# Patient Record
Sex: Male | Born: 1935 | Race: White | Hispanic: No | Smoking: Former smoker
Health system: Southern US, Community
[De-identification: ages and names within clinical notes are randomized; demographics above are authoritative.]

## PROBLEM LIST (undated history)

## (undated) DIAGNOSIS — M25562 Pain in left knee: Secondary | ICD-10-CM

## (undated) DIAGNOSIS — I2510 Atherosclerotic heart disease of native coronary artery without angina pectoris: Secondary | ICD-10-CM

## (undated) DIAGNOSIS — C4490 Unspecified malignant neoplasm of skin, unspecified: Secondary | ICD-10-CM

## (undated) DIAGNOSIS — I340 Nonrheumatic mitral (valve) insufficiency: Secondary | ICD-10-CM

## (undated) DIAGNOSIS — N189 Chronic kidney disease, unspecified: Secondary | ICD-10-CM

## (undated) DIAGNOSIS — J449 Chronic obstructive pulmonary disease, unspecified: Secondary | ICD-10-CM

## (undated) DIAGNOSIS — I214 Non-ST elevation (NSTEMI) myocardial infarction: Secondary | ICD-10-CM

## (undated) DIAGNOSIS — K225 Diverticulum of esophagus, acquired: Secondary | ICD-10-CM

## (undated) DIAGNOSIS — M109 Gout, unspecified: Secondary | ICD-10-CM

## (undated) DIAGNOSIS — J45909 Unspecified asthma, uncomplicated: Secondary | ICD-10-CM

## (undated) DIAGNOSIS — M542 Cervicalgia: Secondary | ICD-10-CM

## (undated) DIAGNOSIS — IMO0001 Reserved for inherently not codable concepts without codable children: Secondary | ICD-10-CM

## (undated) DIAGNOSIS — K4090 Unilateral inguinal hernia, without obstruction or gangrene, not specified as recurrent: Secondary | ICD-10-CM

## (undated) DIAGNOSIS — I25810 Atherosclerosis of coronary artery bypass graft(s) without angina pectoris: Secondary | ICD-10-CM

## (undated) DIAGNOSIS — Z952 Presence of prosthetic heart valve: Secondary | ICD-10-CM

## (undated) DIAGNOSIS — M1990 Unspecified osteoarthritis, unspecified site: Secondary | ICD-10-CM

## (undated) DIAGNOSIS — E785 Hyperlipidemia, unspecified: Secondary | ICD-10-CM

## (undated) DIAGNOSIS — I10 Essential (primary) hypertension: Secondary | ICD-10-CM

## (undated) DIAGNOSIS — I639 Cerebral infarction, unspecified: Secondary | ICD-10-CM

## (undated) DIAGNOSIS — I350 Nonrheumatic aortic (valve) stenosis: Secondary | ICD-10-CM

## (undated) DIAGNOSIS — I5042 Chronic combined systolic (congestive) and diastolic (congestive) heart failure: Secondary | ICD-10-CM

## (undated) DIAGNOSIS — I209 Angina pectoris, unspecified: Secondary | ICD-10-CM

## (undated) DIAGNOSIS — G8929 Other chronic pain: Secondary | ICD-10-CM

## (undated) DIAGNOSIS — K219 Gastro-esophageal reflux disease without esophagitis: Secondary | ICD-10-CM

## (undated) DIAGNOSIS — R011 Cardiac murmur, unspecified: Secondary | ICD-10-CM

## (undated) HISTORY — DX: Chronic combined systolic (congestive) and diastolic (congestive) heart failure: I50.42

## (undated) HISTORY — DX: Chronic obstructive pulmonary disease, unspecified: J44.9

## (undated) HISTORY — DX: Essential (primary) hypertension: I10

## (undated) HISTORY — DX: Unspecified asthma, uncomplicated: J45.909

## (undated) HISTORY — DX: Hyperlipidemia, unspecified: E78.5

## (undated) HISTORY — DX: Chronic kidney disease, unspecified: N18.9

## (undated) HISTORY — PX: CATARACT EXTRACTION: SUR2

## (undated) HISTORY — DX: Gout, unspecified: M10.9

## (undated) HISTORY — DX: Unspecified osteoarthritis, unspecified site: M19.90

## (undated) HISTORY — PX: CARDIAC VALVE REPLACEMENT: SHX585

## (undated) HISTORY — PX: CARDIAC CATHETERIZATION: SHX172

## (undated) HISTORY — DX: Atherosclerotic heart disease of native coronary artery without angina pectoris: I25.10

## (undated) HISTORY — DX: Cardiac murmur, unspecified: R01.1

## (undated) HISTORY — DX: Cerebral infarction, unspecified: I63.9

## (undated) HISTORY — DX: Atherosclerosis of coronary artery bypass graft(s) without angina pectoris: I25.810

## (undated) HISTORY — DX: Diverticulum of esophagus, acquired: K22.5

## (undated) HISTORY — DX: Nonrheumatic mitral (valve) insufficiency: I34.0

## (undated) HISTORY — PX: SKIN CANCER EXCISION: SHX779

## (undated) HISTORY — DX: Nonrheumatic aortic (valve) stenosis: I35.0

## (undated) HISTORY — DX: Unilateral inguinal hernia, without obstruction or gangrene, not specified as recurrent: K40.90

---

## 1963-02-06 DIAGNOSIS — I639 Cerebral infarction, unspecified: Secondary | ICD-10-CM

## 1963-02-06 HISTORY — DX: Cerebral infarction, unspecified: I63.9

## 1997-01-06 DIAGNOSIS — Z951 Presence of aortocoronary bypass graft: Secondary | ICD-10-CM | POA: Insufficient documentation

## 1997-01-06 HISTORY — PX: CORONARY ARTERY BYPASS GRAFT: SHX141

## 2010-03-17 DIAGNOSIS — I10 Essential (primary) hypertension: Secondary | ICD-10-CM | POA: Insufficient documentation

## 2010-03-17 DIAGNOSIS — I2510 Atherosclerotic heart disease of native coronary artery without angina pectoris: Secondary | ICD-10-CM | POA: Insufficient documentation

## 2011-01-25 LAB — LIPID PANEL: HDL: 52 mg/dL (ref 35–70)

## 2011-04-20 DIAGNOSIS — I340 Nonrheumatic mitral (valve) insufficiency: Secondary | ICD-10-CM | POA: Insufficient documentation

## 2011-04-20 DIAGNOSIS — Z951 Presence of aortocoronary bypass graft: Secondary | ICD-10-CM | POA: Insufficient documentation

## 2011-10-11 ENCOUNTER — Ambulatory Visit (INDEPENDENT_AMBULATORY_CARE_PROVIDER_SITE_OTHER): Payer: Medicare Other | Admitting: Sports Medicine

## 2011-10-11 ENCOUNTER — Encounter: Payer: Self-pay | Admitting: Sports Medicine

## 2011-10-11 VITALS — BP 121/61 | HR 59 | Ht 65.5 in | Wt 134.0 lb

## 2011-10-11 DIAGNOSIS — I350 Nonrheumatic aortic (valve) stenosis: Secondary | ICD-10-CM

## 2011-10-11 DIAGNOSIS — N289 Disorder of kidney and ureter, unspecified: Secondary | ICD-10-CM

## 2011-10-11 DIAGNOSIS — J449 Chronic obstructive pulmonary disease, unspecified: Secondary | ICD-10-CM

## 2011-10-11 DIAGNOSIS — I10 Essential (primary) hypertension: Secondary | ICD-10-CM

## 2011-10-11 DIAGNOSIS — M5412 Radiculopathy, cervical region: Secondary | ICD-10-CM | POA: Insufficient documentation

## 2011-10-11 DIAGNOSIS — E785 Hyperlipidemia, unspecified: Secondary | ICD-10-CM

## 2011-10-11 DIAGNOSIS — I359 Nonrheumatic aortic valve disorder, unspecified: Secondary | ICD-10-CM

## 2011-10-11 DIAGNOSIS — J4489 Other specified chronic obstructive pulmonary disease: Secondary | ICD-10-CM

## 2011-10-11 DIAGNOSIS — M159 Polyosteoarthritis, unspecified: Secondary | ICD-10-CM

## 2011-10-11 DIAGNOSIS — M109 Gout, unspecified: Secondary | ICD-10-CM

## 2011-10-11 HISTORY — DX: Chronic obstructive pulmonary disease, unspecified: J44.9

## 2011-10-11 NOTE — Assessment & Plan Note (Signed)
Currently stable on Advair and albuterol. He does endorse he has increased his use of albuterol over time, at future visits we will discuss increasing his Advair dose.

## 2011-10-11 NOTE — Assessment & Plan Note (Signed)
Currently taking naproxen, he has declined Celebrex in the past. He does understand he needs to stop his naproxen in the setting of renal insufficiency.

## 2011-10-11 NOTE — Assessment & Plan Note (Signed)
Continue allopurinol. Checking uric acid level. Goal is less than 5.

## 2011-10-11 NOTE — Progress Notes (Signed)
Patient ID: Eric Buchanan, male   DOB: [birth year], 78 y.o.   MRN: [id number] Subjective:    CC: Establish care.   HPI:  Renal insufficiency: Recently diagnosed with creatinine about 2 by another provider. This has not yet been worked up. He was advised at that point to stop his Lasix.  Hyperlipidemia: Hasn't been checked in several months, he is on simvastatin.   Hypertension:Will controlled, currently on furosemide, and Benicar.  Gout: One allopurinol, uric acid levels have not been checked.  Aortic stenosis/mitral regurgitation:  Recent echocardiogram one year ago, this is currently managed by Dr.David Defeo at [facility] chest specialists.   Past medical history, Surgical history, Family history, Social history, Allergies, and medications have been entered into the medical record, reviewed, and no changes needed.   Review of Systems: No headache, visual changes, nausea, vomiting, diarrhea, constipation, dizziness, abdominal pain, skin rash, fevers, chills, night sweats, weight loss, chest pain, body aches, joint swelling, muscle aches, or shortness of breath.   Objective:    General: Well Developed, well nourished, and in no acute distress.  Neuro: Alert and oriented x3, extra-ocular muscles intact.  HEENT: Normocephalic, atraumatic, pupils equal round reactive to light, neck supple, no masses, no lymphadenopathy, thyroid nonpalpable.  Skin: Warm and dry, no rashes noted.  Cardiac: Regular rate and rhythm,  3 of 6 systolic murmur at the right second intercostal space.  Respiratory: Clear to auscultation bilaterally. Not using accessory muscles, speaking in full sentences.  Abdominal: Soft, nontender, nondistended, positive bowel sounds, no masses, no organomegaly.  Musculoskeletal: Shoulder, elbow, wrist, hip, knee, ankle stable, and with full range of motion. He does have Heberden, and Bouchard nodes around his fingers. He also has tenderness to palpation over his normal.  Impression and  Recommendations:

## 2011-10-11 NOTE — Assessment & Plan Note (Signed)
Though he is is not fasting, the patient does request a lipid panel checked today. We'll do this. Continue simvastatin.

## 2011-10-11 NOTE — Assessment & Plan Note (Signed)
He has had a recent echocardiogram, and this is managed by his chest physician. He is status post cardiac bypass surgery.

## 2011-10-11 NOTE — Assessment & Plan Note (Signed)
Continue Benicar for now.

## 2011-10-11 NOTE — Patient Instructions (Addendum)
Great to meet you. We will check some preliminary blood work. I would like you to stop your naproxen, and Lasix. Come back to see me in 6 weeks, at that point we can consider doing your welcome to Medicare visit. Ihor Austin. Benjamin Stain, M.D.

## 2011-10-11 NOTE — Assessment & Plan Note (Addendum)
Currently mild. Currently is also unclear whether this is acute, chronic. We'll stop offending medications We'll check a renal function, SPEP, UPEP, urinalysis.

## 2011-10-12 LAB — COMPREHENSIVE METABOLIC PANEL WITH GFR
AST: 19 U/L (ref 0–37)
BUN: 45 mg/dL — ABNORMAL HIGH (ref 6–23)
Calcium: 9.3 mg/dL (ref 8.4–10.5)
Chloride: 99 meq/L (ref 96–112)
Creat: 1.9 mg/dL — ABNORMAL HIGH (ref 0.50–1.35)
Total Bilirubin: 0.5 mg/dL (ref 0.3–1.2)

## 2011-10-12 LAB — CBC
HCT: 34.4 % — ABNORMAL LOW (ref 39.0–52.0)
Hemoglobin: 11.4 g/dL — ABNORMAL LOW (ref 13.0–17.0)
MCH: 31.8 pg (ref 26.0–34.0)
MCHC: 33.1 g/dL (ref 30.0–36.0)
MCV: 96.1 fL (ref 78.0–100.0)
Platelets: 289 10*3/uL (ref 150–400)
RBC: 3.58 MIL/uL — ABNORMAL LOW (ref 4.22–5.81)
RDW: 15.1 % (ref 11.5–15.5)
WBC: 5.7 K/uL (ref 4.0–10.5)

## 2011-10-12 LAB — URINALYSIS, ROUTINE W REFLEX MICROSCOPIC
Bilirubin Urine: NEGATIVE
Glucose, UA: NEGATIVE mg/dL
Hgb urine dipstick: NEGATIVE
Ketones, ur: NEGATIVE mg/dL
Leukocytes, UA: NEGATIVE
Nitrite: NEGATIVE
Protein, ur: NEGATIVE mg/dL
Specific Gravity, Urine: 1.007 (ref 1.005–1.030)
Urobilinogen, UA: 0.2 mg/dL (ref 0.0–1.0)
pH: 5 (ref 5.0–8.0)

## 2011-10-12 LAB — COMPREHENSIVE METABOLIC PANEL
ALT: 15 U/L (ref 0–53)
Albumin: 4.1 g/dL (ref 3.5–5.2)
Alkaline Phosphatase: 99 U/L (ref 39–117)
CO2: 28 mEq/L (ref 19–32)
Glucose, Bld: 81 mg/dL (ref 70–99)
Potassium: 5.2 mEq/L (ref 3.5–5.3)
Sodium: 134 mEq/L — ABNORMAL LOW (ref 135–145)
Total Protein: 7.1 g/dL (ref 6.0–8.3)

## 2011-10-12 LAB — LIPID PANEL
Cholesterol: 113 mg/dL (ref 0–200)
HDL: 38 mg/dL — ABNORMAL LOW (ref >39)
LDL Cholesterol: 53 mg/dL (ref 0–99)
Total CHOL/HDL Ratio: 3 ratio
Triglycerides: 109 mg/dL (ref <150)
VLDL: 22 mg/dL (ref 0–40)

## 2011-10-12 LAB — URIC ACID: Uric Acid, Serum: 5.1 mg/dL (ref 4.0–7.8)

## 2011-10-15 ENCOUNTER — Encounter: Payer: Self-pay | Admitting: Sports Medicine

## 2011-10-15 LAB — PROTEIN ELECTROPHORESIS, SERUM
Albumin ELP: 57.3 % (ref 55.8–66.1)
Alpha-1-Globulin: 4.5 % (ref 2.9–4.9)
Alpha-2-Globulin: 11 % (ref 7.1–11.8)
Beta 2: 3.9 % (ref 3.2–6.5)
Beta Globulin: 5.9 % (ref 4.7–7.2)
Gamma Globulin: 17.4 % (ref 11.1–18.8)
Total Protein, Serum Electrophoresis: 7.1 g/dL (ref 6.0–8.3)

## 2011-10-15 LAB — PROTEIN ELECTROPHORESIS, URINE REFLEX: Total Protein, Urine: <3 mg/dL

## 2011-11-08 ENCOUNTER — Encounter: Payer: Self-pay | Admitting: *Deleted

## 2011-11-16 ENCOUNTER — Ambulatory Visit (INDEPENDENT_AMBULATORY_CARE_PROVIDER_SITE_OTHER): Payer: Medicare Other | Admitting: Sports Medicine

## 2011-11-16 ENCOUNTER — Encounter: Payer: Self-pay | Admitting: Sports Medicine

## 2011-11-16 DIAGNOSIS — Z298 Encounter for other specified prophylactic measures: Secondary | ICD-10-CM

## 2011-11-16 DIAGNOSIS — M109 Gout, unspecified: Secondary | ICD-10-CM

## 2011-11-16 DIAGNOSIS — M159 Polyosteoarthritis, unspecified: Secondary | ICD-10-CM

## 2011-11-16 DIAGNOSIS — Z87891 Personal history of nicotine dependence: Secondary | ICD-10-CM

## 2011-11-16 DIAGNOSIS — Z23 Encounter for immunization: Secondary | ICD-10-CM

## 2011-11-16 DIAGNOSIS — N289 Disorder of kidney and ureter, unspecified: Secondary | ICD-10-CM

## 2011-11-16 DIAGNOSIS — Z0000 Encounter for general adult medical examination without abnormal findings: Secondary | ICD-10-CM

## 2011-11-16 LAB — RENAL FUNCTION PANEL
Albumin: 4.3 g/dL (ref 3.5–5.2)
BUN: 27 mg/dL — ABNORMAL HIGH (ref 6–23)
CO2: 24 mEq/L (ref 19–32)
Calcium: 9.9 mg/dL (ref 8.4–10.5)
Chloride: 96 mEq/L (ref 96–112)
Creat: 1.46 mg/dL — ABNORMAL HIGH (ref 0.50–1.35)
Glucose, Bld: 86 mg/dL (ref 70–99)
Phosphorus: 3.3 mg/dL (ref 2.3–4.6)
Potassium: 4.8 mEq/L (ref 3.5–5.3)
Sodium: 129 meq/L — ABNORMAL LOW (ref 135–145)

## 2011-11-16 NOTE — Assessment & Plan Note (Signed)
Uric acid 5.1, this is acceptable.

## 2011-11-16 NOTE — Assessment & Plan Note (Addendum)
Will check another renal function since he stopped his naproxen, and Lasix. I think that we will just need to keep an eye on this. No acute intervention needed.

## 2011-11-16 NOTE — Assessment & Plan Note (Signed)
It does sound as though he has some right-sided cervical radiculopathy, he does have some gabapentin which she's going to start taking. He also takes tramadol. Sons that the symptoms are overall well controlled, but he can come back to see me on an as needed basis to further address this.

## 2011-11-16 NOTE — Progress Notes (Deleted)
  Subjective:    Eric Buchanan is a 78 y.o. male who presents for a welcome to Medicare exam.   Cardiac risk factors: advanced age (older than 23 for men, 45 for women).  Depression Screen (Note: if answer to either of the following is "Yes", a more complete depression screening is indicated)  Q1: Over the past two weeks, have you felt down, depressed or hopeless? No Q2: Over the past two weeks, have you felt little interest or pleasure in doing things? no  Activities of Daily Living In your present state of health, do you have any difficulty performing the following activities?:  Preparing food and eating?: No Bathing yourself: No Getting dressed: No Using the toilet:No Moving around from place to place: No In the past year have you fallen or had a near fall?:No  Current exercise habits: Walking 1/2 mile  Dietary issues discussed: Yes  Hearing difficulties: No Safe in current home environment: yes  The following portions of the patient's history were reviewed and updated as appropriate: allergies, current medications, past family history, past medical history, past social history, past surgical history and problem list. Review of Systems A comprehensive review of systems was negative.    Objective:     Vision by Snellen chart: right eye:20/30, left eye:20/40 There were no vitals taken for this visit. There is no height or weight on file to calculate BMI. General Appearance: Alert, well developed and well nourished, cooperative, no distress.  Head: Normocephalic, no obvious abnormality  Eyes: PERRL, EOM's intact, conjunctiva and corneas clear.  Nose: Nares symmetrical, septum midline, mucosa pink, clear watery discharge; no sinus tenderness  Throat: Lips, tongue, and mucosa are moist, pink, and intact; teeth intact  Neck: Supple, symmetrical, trachea midline, no adenopathy; thyroid: no enlargement, symmetric,no tenderness/mass/nodules; no carotid bruit, no JVD  Back:  Symmetrical, no curvature, ROM normal, no CVA tenderness  Chest/Breast: No mass or tenderness  Lungs: Clear to auscultation bilaterally, respirations unlabored  Heart: Normal PMI, no lower extremity edema, regular rate & rhythm, S1 and S2 normal, no rubs, or gallops. 3/6 SEM at LLSB. Abdomen: Soft, non-tender, bowel sounds active all four quadrants, no mass, or organomegaly  Musculoskeletal: All joints, extremities examined, non-tender and unremarkable.  Lymphatic: No adenopathy  Skin/Hair/Nails: Skin warm, dry, and intact, no rashes or abnormal dyspigmentation  Neurologic: Alert and oriented x3, no cranial nerve deficits, normal strength and tone, gait steady      Assessment:     healthy male     Plan:     During the course of the visit the patient was educated and counseled about appropriate screening and preventive services including:   Influenza vaccine  Screening electrocardiogram  Zostavax Tayvin would like to see me every 6 weeks, I do think that this is appropriate. Patient Instructions (the written plan) was given to the patient.

## 2011-11-19 ENCOUNTER — Encounter: Payer: Self-pay | Admitting: *Deleted

## 2011-11-22 ENCOUNTER — Ambulatory Visit (HOSPITAL_BASED_OUTPATIENT_CLINIC_OR_DEPARTMENT_OTHER)
Admission: RE | Admit: 2011-11-22 | Discharge: 2011-11-22 | Disposition: A | Discharge status: Home / Self Care | Payer: Medicare Other | Source: Ambulatory Visit | Attending: Sports Medicine | Admitting: Sports Medicine

## 2011-11-22 DIAGNOSIS — I700 Atherosclerosis of aorta: Secondary | ICD-10-CM | POA: Insufficient documentation

## 2011-12-11 NOTE — Addendum Note (Signed)
Addended by: Monica Becton on: 12/11/2011 05:42 PM   Modules accepted: Level of Service, SmartSet

## 2011-12-11 NOTE — Progress Notes (Addendum)
Subjective:    Eric Buchanan is a 78 y.o. male who presents for Medicare Annual/Subsequent preventive examination.   Preventive Screening-Counseling & Management  Tobacco History  Smoking status  . Former Smoker  . Quit date: 02/06/1963  Smokeless tobacco  . Not on file    Problems Prior to Visit 1.   Current Problems (verified) Patient Active Problem List  Diagnosis  . Aortic stenosis  . Gout  . Renal insufficiency  . Osteoarthritis, multiple sites  . COPD (chronic obstructive pulmonary disease)  . Hyperlipidemia  . Hypertension    Medications Prior to Visit Current Outpatient Prescriptions on File Prior to Visit  Medication Sig Dispense Refill  . albuterol (PROVENTIL HFA;VENTOLIN HFA) 108 (90 BASE) MCG/ACT inhaler Inhale 2 puffs into the lungs every 6 (six) hours as needed.      Marland Kitchen allopurinol (ZYLOPRIM) 100 MG tablet Take 100 mg by mouth daily.      Marland Kitchen aspirin 81 MG tablet Take 81 mg by mouth daily.      . cholecalciferol (VITAMIN D) 1000 UNITS tablet Take 2,000 Units by mouth daily.      . Fluticasone-Salmeterol (ADVAIR) 250-50 MCG/DOSE AEPB Inhale 1 puff into the lungs every 12 (twelve) hours.      . furosemide (LASIX) 20 MG tablet Take 20 mg by mouth daily.      Marland Kitchen glucosamine-chondroitin 500-400 MG tablet Take 1 tablet by mouth 3 (three) times daily.      Marland Kitchen olmesartan (BENICAR) 20 MG tablet Take 20 mg by mouth daily.      . simvastatin (ZOCOR) 10 MG tablet Take 10 mg by mouth at bedtime.      . traMADol (ULTRAM) 50 MG tablet Take 50 mg by mouth every 8 (eight) hours as needed.        Current Medications (verified) Current Outpatient Prescriptions  Medication Sig Dispense Refill  . albuterol (PROVENTIL HFA;VENTOLIN HFA) 108 (90 BASE) MCG/ACT inhaler Inhale 2 puffs into the lungs every 6 (six) hours as needed.      Marland Kitchen allopurinol (ZYLOPRIM) 100 MG tablet Take 100 mg by mouth daily.      Marland Kitchen aspirin 81 MG tablet Take 81 mg by mouth daily.      . cholecalciferol  (VITAMIN D) 1000 UNITS tablet Take 2,000 Units by mouth daily.      . Fluticasone-Salmeterol (ADVAIR) 250-50 MCG/DOSE AEPB Inhale 1 puff into the lungs every 12 (twelve) hours.      . furosemide (LASIX) 20 MG tablet Take 20 mg by mouth daily.      Marland Kitchen glucosamine-chondroitin 500-400 MG tablet Take 1 tablet by mouth 3 (three) times daily.      Marland Kitchen olmesartan (BENICAR) 20 MG tablet Take 20 mg by mouth daily.      . simvastatin (ZOCOR) 10 MG tablet Take 10 mg by mouth at bedtime.      . traMADol (ULTRAM) 50 MG tablet Take 50 mg by mouth every 8 (eight) hours as needed.         Allergies (verified) Review of patient's allergies indicates no known allergies.   PAST HISTORY  Family History No family history on file.  Social History History  Substance Use Topics  . Smoking status: Former Smoker    Quit date: 02/06/1963  . Smokeless tobacco: Not on file  . Alcohol Use: Yes    Are there smokers in your home (other than you)?  No  Risk Factors Current exercise habits: Walks one half a mile a day  Dietary issues discussed: Yes   Cardiac risk factors: advanced age (older than 70 for men, 72 for women).  Depression Screen (Note: if answer to either of the following is "Yes", a more complete depression screening is indicated)   Q1: Over the past two weeks, have you felt down, depressed or hopeless? No  Q2: Over the past two weeks, have you felt little interest or pleasure in doing things? No  Have you lost interest or pleasure in daily life? No  Do you often feel hopeless? No  Do you cry easily over simple problems? No  Activities of Daily Living In your present state of health, do you have any difficulty performing the following activities?:  Driving? No Managing money?  No Feeding yourself? No Getting from bed to chair? No Climbing a flight of stairs? No Preparing food and eating?: No Bathing or showering? No Getting dressed: No Getting to the toilet? No Using the  toilet:No Moving around from place to place: No In the past year have you fallen or had a near fall?:No   Are you sexually active?  No  Do you have more than one partner?  No  Hearing Difficulties: No Do you often ask people to speak up or repeat themselves? No Do you experience ringing or noises in your ears? No Do you have difficulty understanding soft or whispered voices? No   Do you feel that you have a problem with memory? No  Do you often misplace items? No  Do you feel safe at home?  Yes  Cognitive Testing  Alert? Yes  Normal Appearance?Yes  Oriented to person? Yes  Place? Yes   Time? Yes  Recall of three objects?  Yes  Can perform simple calculations? Yes  Displays appropriate judgment?Yes  Can read the correct time from a watch face?Yes   Advanced Directives have been discussed with the patient? No   List the Names of Other Physician/Practitioners you currently use: 1.    Indicate any recent Medical Services you may have received from other than [facility] providers in the past year (date may be approximate).  Immunization History  Administered Date(s) Administered  . Influenza Split 11/16/2011  . Zoster 11/16/2011    Screening Tests Health Maintenance  Topic Date Due  . Influenza Vaccine  10/06/2012  . Tetanus/tdap  04/20/2019  . Pneumococcal Polysaccharide Vaccine Age 75 And Over  Completed  . Zostavax  Completed    All answers were reviewed with the patient and necessary referrals were made:  Rodney Langton, MD   12/11/2011   History reviewed: allergies, current medications, past family history, past medical history, past social history, past surgical history and problem list  Review of Systems A comprehensive review of systems was negative.    Objective:    Vision by Snellen chart: right eye:20/30, left eye:20/40  There were no vitals taken for this visit.  There is no height or weight on file to calculate BMI.  General Appearance: Alert, well  developed and well nourished, cooperative, no distress.  Head: Normocephalic, no obvious abnormality  Eyes: PERRL, EOM's intact, conjunctiva and corneas clear.  Nose: Nares symmetrical, septum midline, mucosa pink, clear watery discharge; no sinus tenderness  Throat: Lips, tongue, and mucosa are moist, pink, and intact; teeth intact  Neck: Supple, symmetrical, trachea midline, no adenopathy; thyroid: no enlargement, symmetric,no tenderness/mass/nodules; no carotid bruit, no JVD  Back: Symmetrical, no curvature, ROM normal, no CVA tenderness  Chest/Breast: No mass or tenderness  Lungs: Clear to auscultation  bilaterally, respirations unlabored  Heart: Normal PMI, no lower extremity edema, regular rate & rhythm, S1 and S2 normal, no rubs, or gallops. 3/6 SEM at LLSB.  Abdomen: Soft, non-tender, bowel sounds active all four quadrants, no mass, or organomegaly  Musculoskeletal: All joints, extremities examined, non-tender and unremarkable.  Lymphatic: No adenopathy  Skin/Hair/Nails: Skin warm, dry, and intact, no rashes or abnormal dyspigmentation  Neurologic: Alert and oriented x3, no cranial nerve deficits, normal strength and tone, gait steady       Assessment:       Healthy Male Plan:     During the course of the visit the patient was educated and counseled about appropriate screening and preventive services including:    Pneumococcal vaccine   Influenza vaccine  Td vaccine  Diet review for nutrition referral? Yes ____  Not Indicated _x___   Patient Instructions (the written plan) was given to the patient.  Medicare Attestation I have personally reviewed: The patient's medical and social history Their use of alcohol, tobacco or illicit drugs Their current medications and supplements The patient's functional ability including ADLs,fall risks, home safety risks, cognitive, and hearing and visual impairment Diet and physical activities Evidence for depression or mood  disorders  The patient's weight, height, BMI, and visual acuity have been recorded in the chart.  I have made referrals, counseling, and provided education to the patient based on review of the above and I have provided the patient with a written personalized care plan for preventive services.     Rodney Langton, MD   12/11/2011

## 2011-12-27 ENCOUNTER — Encounter: Payer: Self-pay | Admitting: Sports Medicine

## 2011-12-27 ENCOUNTER — Ambulatory Visit (INDEPENDENT_AMBULATORY_CARE_PROVIDER_SITE_OTHER): Payer: Medicare Other

## 2011-12-27 ENCOUNTER — Ambulatory Visit (INDEPENDENT_AMBULATORY_CARE_PROVIDER_SITE_OTHER): Payer: Medicare Other | Admitting: Sports Medicine

## 2011-12-27 VITALS — BP 127/69 | HR 87 | Wt 134.0 lb

## 2011-12-27 DIAGNOSIS — M159 Polyosteoarthritis, unspecified: Secondary | ICD-10-CM

## 2011-12-27 DIAGNOSIS — M47812 Spondylosis without myelopathy or radiculopathy, cervical region: Secondary | ICD-10-CM

## 2011-12-27 DIAGNOSIS — M542 Cervicalgia: Secondary | ICD-10-CM

## 2011-12-27 MED ORDER — PREDNISONE 50 MG PO TABS: ORAL_TABLET | ORAL | Status: DC

## 2011-12-27 MED ORDER — HYDROCODONE-ACETAMINOPHEN 5-325 MG PO TABS: 1.0000 | ORAL_TABLET | Freq: Four times a day (QID) | ORAL | Status: DC | PRN

## 2011-12-27 NOTE — Assessment & Plan Note (Signed)
He's requesting methadone, we can do hydrocodone instead. Restart Aleve. Prednisone. X-rays of the cervical spine. Home rehabilitation exercises, as well as home traction. Return to clinic in 4 weeks, MRI if no better for interventional injection plan.

## 2011-12-27 NOTE — Progress Notes (Signed)
Subjective:    CC: Neck and arm pain  HPI: Eric Buchanan comes to see me with a long history of pain in his neck with numbness and tingling that travels down his arm into his second through fourth fingers. He's taking oral narcotics in the past, has done cervical traction in the past, but is never had advanced imaging in terms of MRI, or injection therapy. The pain is moderate. He is requesting methadone for treatment. It is not worse with Valsalva, and is not really worse with flexion or extension.  Past medical history, Surgical history, Family history, Social history, Allergies, and medications have been entered into the medical record, reviewed, and no changes needed.   Review of Systems: No fevers, chills, night sweats, weight loss, chest pain, or shortness of breath.   Objective:    General: Well Developed, well nourished, and in no acute distress.  Neuro: Alert and oriented x3, extra-ocular muscles intact.  HEENT: Normocephalic, atraumatic, pupils equal round reactive to light, neck supple, no masses, no lymphadenopathy, thyroid nonpalpable.  Skin: Warm and dry, no rashes. Cardiac: Regular rate and rhythm, no murmurs rubs or gallops.  Respiratory: Clear to auscultation bilaterally. Not using accessory muscles, speaking in full sentences. Neck: Inspection unremarkable. No palpable stepoffs. Negative Spurling's maneuver. Full neck range of motion Grip strength and sensation normal in bilateral hands Strength good C4 to T1 distribution No sensory change to C4 to T1 Negative Hoffman sign bilaterally Reflexes mildly hypo-reflexive to right triceps.  X-rays were ordered, and reviewed by me, and show multilevel cervical degenerative disc disease at nearly every level.  Impression and Recommendations:

## 2012-01-07 ENCOUNTER — Telehealth: Payer: Self-pay | Admitting: *Deleted

## 2012-01-07 DIAGNOSIS — M159 Polyosteoarthritis, unspecified: Secondary | ICD-10-CM

## 2012-01-07 MED ORDER — HYDROCODONE-ACETAMINOPHEN 5-325 MG PO TABS: 1.0000 | ORAL_TABLET | Freq: Four times a day (QID) | ORAL | Status: DC | PRN

## 2012-01-07 MED ORDER — OLMESARTAN MEDOXOMIL 20 MG PO TABS: 20.0000 mg | ORAL_TABLET | Freq: Every day | ORAL | Status: DC

## 2012-01-07 MED ORDER — SIMVASTATIN 10 MG PO TABS: 10.0000 mg | ORAL_TABLET | Freq: Every day | ORAL | Status: DC

## 2012-01-07 NOTE — Telephone Encounter (Signed)
Pt had a refill on Hydrocodone on 12/27/11. He is asking for another refill to be sent to [facility]. States he has some pain at times. Please advise.

## 2012-01-07 NOTE — Telephone Encounter (Signed)
Yes that's fine, refill is in my outbox.

## 2012-01-24 ENCOUNTER — Ambulatory Visit (INDEPENDENT_AMBULATORY_CARE_PROVIDER_SITE_OTHER): Payer: Medicare Other | Admitting: Sports Medicine

## 2012-01-24 ENCOUNTER — Encounter: Payer: Self-pay | Admitting: Sports Medicine

## 2012-01-24 VITALS — BP 147/74 | HR 75 | Wt 135.0 lb

## 2012-01-24 DIAGNOSIS — I10 Essential (primary) hypertension: Secondary | ICD-10-CM

## 2012-01-24 DIAGNOSIS — M5412 Radiculopathy, cervical region: Secondary | ICD-10-CM

## 2012-01-24 MED ORDER — OLMESARTAN MEDOXOMIL 40 MG PO TABS: 40.0000 mg | ORAL_TABLET | Freq: Every day | ORAL | Status: DC

## 2012-01-24 NOTE — Assessment & Plan Note (Signed)
Blood pressure is still slightly high. Increasing Benicar to 40 mg daily

## 2012-01-24 NOTE — Progress Notes (Signed)
SPORTS MEDICINE CONSULTATION REPORT  Subjective:    CC: Followup  HPI: Cervical radiculitis: Has been on conservative therapy, prednisone, hydrocodone, NSAIDs, gabapentin. Hydrocodone did improve his pain, but he is noting increasing numbness and tingling in his right third and fourth fingers. The pain does radiate up his arm, and is severe. This is been present for years.  Hypertension: Uncontrolled, on Benicar 20 mg daily.  Past medical history, Surgical history, Family history, Social history, Allergies, and medications have been entered into the medical record, reviewed, and no changes needed.   Review of Systems: No headache, visual changes, nausea, vomiting, diarrhea, constipation, dizziness, abdominal pain, skin rash, fevers, chills, night sweats, weight loss, swollen lymph nodes, body aches, joint swelling, muscle aches, chest pain, shortness of breath, mood changes, visual or auditory hallucinations.   Objective:   Vitals:  Afebrile, vital signs stable. General: Well Developed, well nourished, and in no acute distress.  Neuro/Psych: Alert and oriented x3, extra-ocular muscles intact, able to move all 4 extremities.  Skin: Warm and dry, no rashes noted.  Respiratory: Not using accessory muscles, speaking in full sentences, trachea midline.  Cardiovascular: Pulses palpable, no extremity edema. Abdomen: Does not appear distended. Neck: Inspection unremarkable. No palpable stepoffs. Negative Spurling's maneuver. Full neck range of motion Grip strength and sensation normal in bilateral hands Strength good C4 to T1 distribution No sensory change to C4 to T1 Negative Hoffman sign bilaterally Reflexes normal  Impression and Recommendations:   This case required medical decision making of moderate complexity.

## 2012-01-24 NOTE — Assessment & Plan Note (Signed)
Continuing to have radicular symptoms. At this point we need an MRI for interventional injection planning.

## 2012-01-24 NOTE — Patient Instructions (Signed)
Double up your Benicar until you runs out, then refill with the new 40 mg pills. MRI of her cervical spine, come back to see me to go for MRI results.

## 2012-01-28 ENCOUNTER — Ambulatory Visit: Payer: Medicare Other | Admitting: Sports Medicine

## 2012-02-02 ENCOUNTER — Ambulatory Visit (HOSPITAL_BASED_OUTPATIENT_CLINIC_OR_DEPARTMENT_OTHER)
Admission: RE | Admit: 2012-02-02 | Discharge: 2012-02-02 | Disposition: A | Discharge status: Home / Self Care | Payer: Medicare Other | Source: Ambulatory Visit | Attending: Sports Medicine | Admitting: Sports Medicine

## 2012-02-02 ENCOUNTER — Other Ambulatory Visit: Payer: Self-pay | Admitting: Sports Medicine

## 2012-02-02 DIAGNOSIS — Z1389 Encounter for screening for other disorder: Secondary | ICD-10-CM

## 2012-02-02 DIAGNOSIS — M5412 Radiculopathy, cervical region: Secondary | ICD-10-CM

## 2012-02-02 DIAGNOSIS — M4802 Spinal stenosis, cervical region: Secondary | ICD-10-CM | POA: Insufficient documentation

## 2012-03-07 ENCOUNTER — Ambulatory Visit (INDEPENDENT_AMBULATORY_CARE_PROVIDER_SITE_OTHER): Payer: Medicare Other | Admitting: Sports Medicine

## 2012-03-07 DIAGNOSIS — M5412 Radiculopathy, cervical region: Secondary | ICD-10-CM

## 2012-03-07 MED ORDER — TRAMADOL HCL 50 MG PO TABS: 50.0000 mg | ORAL_TABLET | Freq: Three times a day (TID) | ORAL | Status: DC | PRN

## 2012-03-07 NOTE — Progress Notes (Signed)
  Subjective:    CC: Followup  HPI: Eric Buchanan comes back with pain that he's been having in his neck. He recalls pain going down his right arm to the third and fourth fingers associated with numbness and tingling. He has already been through oral steroids, therapy, narcotics, NSAIDs, muscle relaxers with no improvement. Most recently I ordered an MRI the results of which will be dictated below. Symptoms are persistent, moderate, and do bother him while sleeping.  Past medical history, Surgical history, Family history not pertinant except as noted below, Social history, Allergies, and medications have been entered into the medical record, reviewed, and no changes needed.   Review of Systems: No headache, visual changes, nausea, vomiting, diarrhea, constipation, dizziness, abdominal pain, skin rash, fevers, chills, night sweats, weight loss, swollen lymph nodes, body aches, joint swelling, muscle aches, chest pain, shortness of breath, mood changes, visual or auditory hallucinations.   Objective:   General: Well Developed, well nourished, and in no acute distress.  Neuro/Psych: Alert and oriented x3, extra-ocular muscles intact, able to move all 4 extremities, sensation grossly intact. Skin: Warm and dry, no rashes noted.  Respiratory: Not using accessory muscles, speaking in full sentences, trachea midline.  Cardiovascular: Pulses palpable, no extremity edema. Abdomen: Does not appear distended. Neck: Inspection unremarkable. No palpable stepoffs. Negative Spurling's maneuver. Full neck range of motion Grip strength and sensation normal in bilateral hands Strength good C4 to T1 distribution No sensory change to C4 to T1 Negative Hoffman sign bilaterally Reflexes normal  I reviewed the MRI image by image, there is a multilevel degenerative disc disease with multilevel spondylosis with upper cervical moderate spinal stenosis. The predominant finding is a right-sided disc protrusion at the  C6-C7 level likely affecting the right exiting C7 nerve root.  Impression and Recommendations:   This case required medical decision making of moderate complexity.

## 2012-03-07 NOTE — Assessment & Plan Note (Signed)
Symptoms are predominantly a right-sided C7 radiculitis. On my personal review of the MRI, there is a moderate disc protrusion on the right side at the C6-7 level, likely affecting the exiting C7 nerve root. I would like him to have a interventional C6-C7 right-sided interlaminar epidural injection. I would like to see him back after the injection is done to see how symptoms have improved.

## 2012-03-13 ENCOUNTER — Ambulatory Visit
Admission: RE | Admit: 2012-03-13 | Discharge: 2012-03-13 | Disposition: A | Discharge status: Home / Self Care | Payer: Medicare Other | Source: Ambulatory Visit | Attending: Sports Medicine | Admitting: Sports Medicine

## 2012-03-13 VITALS — BP 164/76 | HR 67

## 2012-03-13 DIAGNOSIS — M5412 Radiculopathy, cervical region: Secondary | ICD-10-CM

## 2012-03-13 DIAGNOSIS — M5020 Other cervical disc displacement, unspecified cervical region: Secondary | ICD-10-CM

## 2012-03-13 MED ORDER — TRIAMCINOLONE ACETONIDE 40 MG/ML IJ SUSP (RADIOLOGY)
60.0000 mg | Freq: Once | INTRAMUSCULAR | Status: AC
  Administered 2012-03-13: 60 mg via EPIDURAL

## 2012-03-13 MED ORDER — IOHEXOL 300 MG/ML  SOLN
1.0000 mL | Freq: Once | INTRAMUSCULAR | Status: AC | PRN
  Administered 2012-03-13: 1 mL via EPIDURAL

## 2012-03-25 ENCOUNTER — Other Ambulatory Visit: Payer: Self-pay | Admitting: Sports Medicine

## 2012-03-25 DIAGNOSIS — M5412 Radiculopathy, cervical region: Secondary | ICD-10-CM

## 2012-03-25 MED ORDER — TRAMADOL HCL 50 MG PO TABS: 50.0000 mg | ORAL_TABLET | Freq: Three times a day (TID) | ORAL | Status: DC | PRN

## 2012-03-25 NOTE — Assessment & Plan Note (Signed)
Excellent response to initial right-sided C7-T1 interlaminar epidural steroid injection. Pain completely resolved the numbness persists. I would like to try a repeat injection on the right ideally at the C6-C7 level if able, but okay at the C7-T1 level.

## 2012-03-26 NOTE — Progress Notes (Signed)
Danielle call and get him schedule for his injection.

## 2012-04-03 ENCOUNTER — Other Ambulatory Visit: Payer: Medicare Other

## 2012-05-19 ENCOUNTER — Ambulatory Visit (INDEPENDENT_AMBULATORY_CARE_PROVIDER_SITE_OTHER): Payer: Medicare Other | Admitting: Sports Medicine

## 2012-05-19 ENCOUNTER — Encounter: Payer: Self-pay | Admitting: Sports Medicine

## 2012-05-19 VITALS — BP 145/72 | HR 86 | Wt 132.0 lb

## 2012-05-19 DIAGNOSIS — M5412 Radiculopathy, cervical region: Secondary | ICD-10-CM

## 2012-05-19 NOTE — Progress Notes (Signed)
  Subjective:    CC: Followup  HPI: Cervical radiculitis: Right-sided C7 clinically, Jatavius is now status post a C7-T1 interlaminar epidural steroid injection. He noted good relief, with resolution of his neck pain as well as right arm pain after the injection. Over the past few months his wife has been sick, and unfortunately recently passed away last week. He has been postponing his care while taking care of her, now he's doing fairly well, and does not want to dwell on her death, and is very interested in becoming more aggressive in his own care. Pain is in the neck, worse with flexion, radiating down the right arm in a C7 distribution, better after the injection.  Past medical history, Surgical history, Family history not pertinant except as noted below, Social history, Allergies, and medications have been entered into the medical record, reviewed, and no changes needed.   Review of Systems: No fevers, chills, night sweats, weight loss, chest pain, or shortness of breath.   Objective:    General: Well Developed, well nourished, and in no acute distress.  Neuro: Alert and oriented x3, extra-ocular muscles intact, sensation grossly intact.  HEENT: Normocephalic, atraumatic, pupils equal round reactive to light, neck supple, no masses, no lymphadenopathy, thyroid nonpalpable.  Skin: Warm and dry, no rashes. Cardiac: Regular rate and rhythm, no murmurs rubs or gallops, no lower extremity edema.  Respiratory: Clear to auscultation bilaterally. Not using accessory muscles, speaking in full sentences. Impression and Recommendations:

## 2012-05-19 NOTE — Assessment & Plan Note (Signed)
Excellent response to initial right-sided C7-T1 interlaminar epidural steroid injection. I would like him to do a repeat, if possible at the C6-C7 level. I would like to see him back after the injection to see how things are going.

## 2012-05-22 ENCOUNTER — Ambulatory Visit
Admission: RE | Admit: 2012-05-22 | Discharge: 2012-05-22 | Disposition: A | Discharge status: Home / Self Care | Payer: Medicare Other | Source: Ambulatory Visit | Attending: Sports Medicine | Admitting: Sports Medicine

## 2012-05-22 MED ORDER — TRIAMCINOLONE ACETONIDE 40 MG/ML IJ SUSP (RADIOLOGY)
60.0000 mg | Freq: Once | INTRAMUSCULAR | Status: AC
  Administered 2012-05-22: 60 mg via EPIDURAL

## 2012-05-22 MED ORDER — IOHEXOL 300 MG/ML  SOLN
1.0000 mL | Freq: Once | INTRAMUSCULAR | Status: AC | PRN
  Administered 2012-05-22: 1 mL via EPIDURAL

## 2012-06-02 ENCOUNTER — Encounter: Payer: Self-pay | Admitting: Sports Medicine

## 2012-06-02 ENCOUNTER — Ambulatory Visit (INDEPENDENT_AMBULATORY_CARE_PROVIDER_SITE_OTHER): Payer: Medicare Other | Admitting: Sports Medicine

## 2012-06-02 VITALS — BP 130/78 | HR 74 | Wt 135.0 lb

## 2012-06-02 DIAGNOSIS — M5412 Radiculopathy, cervical region: Secondary | ICD-10-CM

## 2012-06-02 DIAGNOSIS — J449 Chronic obstructive pulmonary disease, unspecified: Secondary | ICD-10-CM

## 2012-06-02 DIAGNOSIS — J4489 Other specified chronic obstructive pulmonary disease: Secondary | ICD-10-CM

## 2012-06-02 DIAGNOSIS — I10 Essential (primary) hypertension: Secondary | ICD-10-CM

## 2012-06-02 MED ORDER — OLMESARTAN MEDOXOMIL 40 MG PO TABS: 40.0000 mg | ORAL_TABLET | Freq: Every day | ORAL | Status: DC

## 2012-06-02 MED ORDER — GABAPENTIN 300 MG PO CAPS: ORAL_CAPSULE | ORAL | Status: DC

## 2012-06-02 NOTE — Assessment & Plan Note (Signed)
Refilling Benicar. Control is adequate

## 2012-06-02 NOTE — Progress Notes (Signed)
  Subjective:    CC: Followup  HPI: Cervical radiculitis: This is been present for decades. This is Eric Buchanan's second C7-T1 right-sided interlaminar injection. The first injection gave him great relief of pain and some relief of his numbness. The second injection gave him no relief of pain, or numbness. The pain continues to be mild in his neck, and radiating down causing numbness and tingling in his third and fourth fingers. He does desire to have a third injection. He also tells me that they were unable to access the C6-C7 level due to severe degenerative changes. His pain does respond fairly well when he does take tramadol.  COPD: Needs refills on Advair, doing well with this.  Past medical history, Surgical history, Family history not pertinant except as noted below, Social history, Allergies, and medications have been entered into the medical record, reviewed, and no changes needed.   Review of Systems: No fevers, chills, night sweats, weight loss, chest pain, or shortness of breath.   Objective:    General: Well Developed, well nourished, and in no acute distress.  Neuro: Alert and oriented x3, extra-ocular muscles intact, sensation grossly intact.  HEENT: Normocephalic, atraumatic, pupils equal round reactive to light, neck supple, no masses, no lymphadenopathy, thyroid nonpalpable.  Skin: Warm and dry, no rashes. Cardiac: Regular rate and rhythm, no murmurs rubs or gallops, no lower extremity edema.  Respiratory: Clear to auscultation bilaterally. Not using accessory muscles, speaking in full sentences. Impression and Recommendations:

## 2012-06-02 NOTE — Assessment & Plan Note (Signed)
Advair samples given. Doing well.

## 2012-06-02 NOTE — Assessment & Plan Note (Addendum)
No response to second right-sided C7-T1 interlaminar epidural injection. It seems as though there is too much degenerative change to inject  at the C6-C7 level. He does want to try a single additional C7-T1 injection, it would be good if we could do the level above.  Also adding gabapentin. He will come back to see me in 6 weeks.

## 2012-06-12 ENCOUNTER — Other Ambulatory Visit: Payer: Self-pay | Admitting: *Deleted

## 2012-06-12 DIAGNOSIS — I10 Essential (primary) hypertension: Secondary | ICD-10-CM

## 2012-06-12 MED ORDER — OLMESARTAN MEDOXOMIL 40 MG PO TABS: 40.0000 mg | ORAL_TABLET | Freq: Every day | ORAL | Status: DC

## 2012-06-27 ENCOUNTER — Encounter: Payer: Self-pay | Admitting: Sports Medicine

## 2012-06-27 ENCOUNTER — Ambulatory Visit (INDEPENDENT_AMBULATORY_CARE_PROVIDER_SITE_OTHER): Payer: Medicare Other | Admitting: Sports Medicine

## 2012-06-27 ENCOUNTER — Ambulatory Visit (INDEPENDENT_AMBULATORY_CARE_PROVIDER_SITE_OTHER): Payer: Medicare Other

## 2012-06-27 VITALS — BP 128/70 | HR 86 | Wt 132.0 lb

## 2012-06-27 DIAGNOSIS — M5412 Radiculopathy, cervical region: Secondary | ICD-10-CM

## 2012-06-27 DIAGNOSIS — N289 Disorder of kidney and ureter, unspecified: Secondary | ICD-10-CM

## 2012-06-27 DIAGNOSIS — J449 Chronic obstructive pulmonary disease, unspecified: Secondary | ICD-10-CM

## 2012-06-27 DIAGNOSIS — R05 Cough: Secondary | ICD-10-CM

## 2012-06-27 DIAGNOSIS — J4489 Other specified chronic obstructive pulmonary disease: Secondary | ICD-10-CM

## 2012-06-27 DIAGNOSIS — R0989 Other specified symptoms and signs involving the circulatory and respiratory systems: Secondary | ICD-10-CM

## 2012-06-27 DIAGNOSIS — F4321 Adjustment disorder with depressed mood: Secondary | ICD-10-CM

## 2012-06-27 DIAGNOSIS — I10 Essential (primary) hypertension: Secondary | ICD-10-CM

## 2012-06-27 MED ORDER — PREDNISONE 50 MG PO TABS: 50.0000 mg | ORAL_TABLET | Freq: Every day | ORAL | Status: DC

## 2012-06-27 MED ORDER — AZITHROMYCIN 250 MG PO TABS: ORAL_TABLET | ORAL | Status: DC

## 2012-06-27 NOTE — Assessment & Plan Note (Signed)
Currently in mild exacerbation. We have given him some Advair samples. Azithromycin, prednisone, chest x-ray. Return in one week.

## 2012-06-27 NOTE — Assessment & Plan Note (Signed)
Recheck at the next visit. Currently off of most of his nephrotoxic medications.

## 2012-06-27 NOTE — Progress Notes (Signed)
  Subjective:    CC: Followup  HPI: Cough: History of COPD, for the past few days he's developed increasing cough productive of brownish sputum, as well as increased fatigue, sleeping, no constitutional symptoms.  Cervical degenerative disc disease: Excellent response to first interlaminar epidural, no response to second interlaminar epidural, still awaiting third. Has not yet started gabapentin.  Grieving: Wife passed away approximately 2 months ago. No suicidal or homicidal ideation, but does feel sad, and misses her.  Amenable to talking to our counselors downstairs.  Hypertension: Well controlled.  Past medical history, Surgical history, Family history not pertinant except as noted below, Social history, Allergies, and medications have been entered into the medical record, reviewed, and no changes needed.   Review of Systems: No fevers, chills, night sweats, weight loss, chest pain, or shortness of breath.   Objective:    General: Well Developed, well nourished, and in no acute distress.  Neuro: Alert and oriented x3, extra-ocular muscles intact, sensation grossly intact.  HEENT: Normocephalic, atraumatic, pupils equal round reactive to light, neck supple, no masses, no lymphadenopathy, thyroid nonpalpable.  Skin: Warm and dry, no rashes. Cardiac: Regular rate and rhythm, no murmurs rubs or gallops, no lower extremity edema.  Respiratory: Bibasilar crackles, no wheezes. Not using accessory muscles, speaking in full sentences. Impression and Recommendations:

## 2012-06-27 NOTE — Assessment & Plan Note (Signed)
Well controlled 

## 2012-06-27 NOTE — Assessment & Plan Note (Signed)
We've scheduled his third interlaminar epidural. He will see me a week later to see how things went. Unfortunately he had excellent response to the first and no response to the second.

## 2012-06-27 NOTE — Assessment & Plan Note (Signed)
No suicidal or homicidal ideation. He does agree to go downstairs to talk to some of our counselors. I will place a referral.

## 2012-07-02 ENCOUNTER — Ambulatory Visit: Payer: Medicare Other | Admitting: Sports Medicine

## 2012-07-03 ENCOUNTER — Encounter: Payer: Self-pay | Admitting: Sports Medicine

## 2012-07-03 ENCOUNTER — Ambulatory Visit
Admission: RE | Admit: 2012-07-03 | Discharge: 2012-07-03 | Disposition: A | Discharge status: Home / Self Care | Payer: Medicare Other | Source: Ambulatory Visit | Attending: Sports Medicine | Admitting: Sports Medicine

## 2012-07-03 ENCOUNTER — Ambulatory Visit (INDEPENDENT_AMBULATORY_CARE_PROVIDER_SITE_OTHER): Payer: Medicare Other | Admitting: Sports Medicine

## 2012-07-03 VITALS — BP 176/85 | HR 59

## 2012-07-03 VITALS — BP 109/60 | HR 82 | Temp 97.8°F | Wt 133.0 lb

## 2012-07-03 DIAGNOSIS — Z0289 Encounter for other administrative examinations: Secondary | ICD-10-CM

## 2012-07-03 DIAGNOSIS — J449 Chronic obstructive pulmonary disease, unspecified: Secondary | ICD-10-CM

## 2012-07-03 DIAGNOSIS — J4489 Other specified chronic obstructive pulmonary disease: Secondary | ICD-10-CM

## 2012-07-03 DIAGNOSIS — M5412 Radiculopathy, cervical region: Secondary | ICD-10-CM

## 2012-07-03 DIAGNOSIS — N289 Disorder of kidney and ureter, unspecified: Secondary | ICD-10-CM

## 2012-07-03 DIAGNOSIS — I10 Essential (primary) hypertension: Secondary | ICD-10-CM

## 2012-07-03 MED ORDER — TRAMADOL HCL 50 MG PO TABS: 50.0000 mg | ORAL_TABLET | Freq: Three times a day (TID) | ORAL | Status: DC | PRN

## 2012-07-03 MED ORDER — IOHEXOL 300 MG/ML  SOLN
1.0000 mL | Freq: Once | INTRAMUSCULAR | Status: AC | PRN
  Administered 2012-07-03: 1 mL via EPIDURAL

## 2012-07-03 MED ORDER — TRIAMCINOLONE ACETONIDE 40 MG/ML IJ SUSP (RADIOLOGY)
60.0000 mg | Freq: Once | INTRAMUSCULAR | Status: AC
  Administered 2012-07-03: 60 mg via EPIDURAL

## 2012-07-03 NOTE — Progress Notes (Signed)
Disability/FMLA forms filled out for patient's daughter An Schnabel and placed in my out box. Charge sheet filled out.

## 2012-07-03 NOTE — Assessment & Plan Note (Signed)
Significantly improved after antibiotics and prednisone. Will return if does not completely resolve in a couple of weeks. Certainly we may need to increase his Advair to 500/50.

## 2012-07-03 NOTE — Assessment & Plan Note (Signed)
Still with some numbness but no pain after his third interlaminar epidural. Refilling tramadol.

## 2012-07-03 NOTE — Progress Notes (Signed)
  Subjective:    CC: Followup  HPI: Cervical degenerative disc disease: Pain-free after interlaminar epidural. This was #3.  COPD: Had an exacerbation at the last visit, treated with azithromycin and prednisone, almost completely resolved.  Hypertension: Stable.  Hyperlipidemia: Stable.  Past medical history, Surgical history, Family history not pertinant except as noted below, Social history, Allergies, and medications have been entered into the medical record, reviewed, and no changes needed.   Review of Systems: No fevers, chills, night sweats, weight loss, chest pain, or shortness of breath.   Objective:    General: Well Developed, well nourished, and in no acute distress.  Neuro: Alert and oriented x3, extra-ocular muscles intact, sensation grossly intact.  HEENT: Normocephalic, atraumatic, pupils equal round reactive to light, neck supple, no masses, no lymphadenopathy, thyroid nonpalpable.  Skin: Warm and dry, no rashes. Cardiac: Regular rate and rhythm, no murmurs rubs or gallops, no lower extremity edema.  Respiratory: Clear to auscultation bilaterally. Not using accessory muscles, speaking in full sentences.   Impression and Recommendations:

## 2012-07-03 NOTE — Assessment & Plan Note (Signed)
Resolving

## 2012-07-03 NOTE — Assessment & Plan Note (Signed)
Well controlled 

## 2012-07-04 ENCOUNTER — Ambulatory Visit: Payer: Medicare Other | Admitting: Sports Medicine

## 2012-08-04 ENCOUNTER — Other Ambulatory Visit: Payer: Self-pay

## 2012-08-04 DIAGNOSIS — I10 Essential (primary) hypertension: Secondary | ICD-10-CM

## 2012-08-04 MED ORDER — OLMESARTAN MEDOXOMIL 40 MG PO TABS: 40.0000 mg | ORAL_TABLET | Freq: Every day | ORAL | Status: DC

## 2012-08-06 ENCOUNTER — Telehealth: Payer: Self-pay | Admitting: *Deleted

## 2012-08-06 NOTE — Telephone Encounter (Signed)
Pt called & stated that the benicar you prescribed is over $100/month & would like you to send in something cheaper for him.

## 2012-08-07 ENCOUNTER — Other Ambulatory Visit: Payer: Self-pay

## 2012-08-07 MED ORDER — LISINOPRIL-HYDROCHLOROTHIAZIDE 20-25 MG PO TABS: 0.5000 | ORAL_TABLET | Freq: Every day | ORAL | Status: DC

## 2012-08-07 MED ORDER — ALLOPURINOL 100 MG PO TABS: 100.0000 mg | ORAL_TABLET | Freq: Every day | ORAL | Status: DC

## 2012-08-07 MED ORDER — SIMVASTATIN 10 MG PO TABS: 10.0000 mg | ORAL_TABLET | Freq: Every day | ORAL | Status: DC

## 2012-08-07 NOTE — Telephone Encounter (Signed)
Switching to lisinopril/hydrochlorothiazide. Remind him that he needs to start with one half tablet daily, see me in 2 weeks after starting the medication, and we will augment the dose as needed.

## 2012-08-07 NOTE — Telephone Encounter (Signed)
LMOM of MD instructions and that med sent to pharmacy and start with 1/2 tab daily and call office to schedule BP f/u in 2 weeks. Barry Dienes, LPN

## 2012-08-18 ENCOUNTER — Other Ambulatory Visit: Payer: Self-pay | Admitting: Sports Medicine

## 2012-08-18 ENCOUNTER — Ambulatory Visit (INDEPENDENT_AMBULATORY_CARE_PROVIDER_SITE_OTHER): Payer: Medicare Other

## 2012-08-18 ENCOUNTER — Ambulatory Visit: Payer: Medicare Other

## 2012-08-18 ENCOUNTER — Ambulatory Visit (INDEPENDENT_AMBULATORY_CARE_PROVIDER_SITE_OTHER): Payer: Medicare Other | Admitting: Sports Medicine

## 2012-08-18 ENCOUNTER — Encounter: Payer: Self-pay | Admitting: Sports Medicine

## 2012-08-18 VITALS — BP 126/65 | HR 76 | Wt 138.0 lb

## 2012-08-18 DIAGNOSIS — M25569 Pain in unspecified knee: Secondary | ICD-10-CM

## 2012-08-18 DIAGNOSIS — R351 Nocturia: Secondary | ICD-10-CM | POA: Insufficient documentation

## 2012-08-18 DIAGNOSIS — J449 Chronic obstructive pulmonary disease, unspecified: Secondary | ICD-10-CM

## 2012-08-18 DIAGNOSIS — M25562 Pain in left knee: Secondary | ICD-10-CM

## 2012-08-18 DIAGNOSIS — M5412 Radiculopathy, cervical region: Secondary | ICD-10-CM

## 2012-08-18 DIAGNOSIS — E785 Hyperlipidemia, unspecified: Secondary | ICD-10-CM

## 2012-08-18 DIAGNOSIS — M1712 Unilateral primary osteoarthritis, left knee: Secondary | ICD-10-CM | POA: Insufficient documentation

## 2012-08-18 DIAGNOSIS — J4489 Other specified chronic obstructive pulmonary disease: Secondary | ICD-10-CM

## 2012-08-18 DIAGNOSIS — I10 Essential (primary) hypertension: Secondary | ICD-10-CM

## 2012-08-18 DIAGNOSIS — M25469 Effusion, unspecified knee: Secondary | ICD-10-CM

## 2012-08-18 MED ORDER — BUDESONIDE-FORMOTEROL FUMARATE 160-4.5 MCG/ACT IN AERO: 2.0000 | INHALATION_SPRAY | Freq: Two times a day (BID) | RESPIRATORY_TRACT | Status: DC

## 2012-08-18 NOTE — Progress Notes (Signed)
  Subjective:    CC: Followup  HPI: Hypertension: Well controlled.  Hyperlipidemia: Well controlled  Cervical radiculitis: Pain is completely resolved after 3 epidurals, continues to have some numbness, but is okay with this.  Left knee pain: Had an injection years ago, desires a repeat. Pain is localized at the joint line, no swelling, doesn't radiate, no mechanical symptoms. Moderate.  COPD: was well controlled with Advair, desires refills and samples, cannot afford this medication.  Past medical history, Surgical history, Family history not pertinant except as noted below, Social history, Allergies, and medications have been entered into the medical record, reviewed, and no changes needed.   Review of Systems: No fevers, chills, night sweats, weight loss, chest pain, or shortness of breath.   Objective:    General: Well Developed, well nourished, and in no acute distress.  Neuro: Alert and oriented x3, extra-ocular muscles intact, sensation grossly intact.  HEENT: Normocephalic, atraumatic, pupils equal round reactive to light, neck supple, no masses, no lymphadenopathy, thyroid nonpalpable.  Skin: Warm and dry, no rashes. Cardiac: Regular rate and rhythm, 3/6 systolic ejection murmur, no rubs or gallops, no lower extremity edema.  Respiratory: Clear to auscultation bilaterally. Not using accessory muscles, speaking in full sentences. Left Knee: No effusion, tender to palpation at the joint lines. ROM full in flexion and extension and lower leg rotation. Ligaments with solid consistent endpoints including ACL, PCL, LCL, MCL. Negative Mcmurray's, Apley's, and Thessalonian tests. Non painful patellar compression. Patellar glide without crepitus. Patellar and quadriceps tendons unremarkable. Hamstring and quadriceps strength is normal.   Procedure: Real-time Ultrasound Guided Injection of left knee Device: GE Logiq E  Verbal informed consent obtained.  Time-out conducted.    Noted no overlying erythema, induration, or other signs of local infection.  Skin prepped in a sterile fashion.  Local anesthesia: Topical Ethyl chloride.  With sterile technique and under real time ultrasound guidance:  25-gauge needle advanced in the suprapatellar recess, 2 cc Kenalog 40, 4 cc lidocaine injected easily Completed without difficulty  Pain immediately resolved suggesting accurate placement of the medication.  Advised to call if fevers/chills, erythema, induration, drainage, or persistent bleeding.  Images permanently stored and available for review in the ultrasound unit.  Impression: Technically successful ultrasound guided injection.   Impression and Recommendations:

## 2012-08-18 NOTE — Assessment & Plan Note (Signed)
Most likely related to obstructive uropathy. Declines any pharmacologic intervention.

## 2012-08-18 NOTE — Assessment & Plan Note (Signed)
Likely osteoarthritis. Injected as above. X-rays.

## 2012-08-18 NOTE — Assessment & Plan Note (Signed)
Pain has resolved with 3 epidurals. Numbness persists, he does not desire to pursue any surgical intervention. I agree with this decision.

## 2012-08-18 NOTE — Assessment & Plan Note (Signed)
Unable to afford Advair, I am switching him to Symbicort and giving him some samples.

## 2012-08-18 NOTE — Assessment & Plan Note (Signed)
Well controlled, no changes 

## 2012-09-18 ENCOUNTER — Ambulatory Visit (INDEPENDENT_AMBULATORY_CARE_PROVIDER_SITE_OTHER): Payer: Medicare Other | Admitting: Sports Medicine

## 2012-09-18 ENCOUNTER — Encounter: Payer: Self-pay | Admitting: Sports Medicine

## 2012-09-18 VITALS — BP 128/67 | HR 65 | Wt 139.0 lb

## 2012-09-18 DIAGNOSIS — M25562 Pain in left knee: Secondary | ICD-10-CM

## 2012-09-18 DIAGNOSIS — M25569 Pain in unspecified knee: Secondary | ICD-10-CM

## 2012-09-18 NOTE — Assessment & Plan Note (Signed)
Still with some pain. The overall greatly improved. Eric Buchanan does want me to inject his knee from a different approach, this was performed today from a medial approach. Return to see me on an as-needed basis for this.

## 2012-09-18 NOTE — Progress Notes (Signed)
  Subjective:    CC: Followup  HPI: Knee osteoarthritis:  Guided injection performed at the last visit, pain is much better, he still has a little pain he localizes at the medial joint line and desires that I do an injection at the medial joint line. Pain is localized, persistent, moderate. Right knee is okay.  Past medical history, Surgical history, Family history not pertinant except as noted below, Social history, Allergies, and medications have been entered into the medical record, reviewed, and no changes needed.   Review of Systems: No fevers, chills, night sweats, weight loss, chest pain, or shortness of breath.   Objective:    General: Well Developed, well nourished, and in no acute distress.  Neuro: Alert and oriented x3, extra-ocular muscles intact, sensation grossly intact.  HEENT: Normocephalic, atraumatic, pupils equal round reactive to light, neck supple, no masses, no lymphadenopathy, thyroid nonpalpable.  Skin: Warm and dry, no rashes. Cardiac: Regular rate and rhythm, no murmurs rubs or gallops, no lower extremity edema.  Respiratory: Clear to auscultation bilaterally. Not using accessory muscles, speaking in full sentences. Left Knee: Normal to inspection with no erythema or effusion or obvious bony abnormalities. Tender to palpation at the medial joint line. ROM full in flexion and extension and lower leg rotation. Ligaments with solid consistent endpoints including ACL, PCL, LCL, MCL. Negative Mcmurray's, Apley's, and Thessalonian tests. Non painful patellar compression. Patellar glide without crepitus. Patellar and quadriceps tendons unremarkable. Hamstring and quadriceps strength is normal.   Procedure:  Injection of left knee Consent obtained and verified. Time-out conducted. Noted no overlying erythema, induration, or other signs of local infection. Skin prepped in a sterile fashion. Topical analgesic spray: Ethyl chloride. Completed without  difficulty. Meds: 25-gauge needle advanced just distal to and medial to the inferior pole of the patella, 2 cc Kenalog 40, 4 cc lidocaine injected easily. Pain immediately improved suggesting accurate placement of the medication. Advised to call if fevers/chills, erythema, induration, drainage, or persistent bleeding.  Impression and Recommendations:

## 2012-10-06 ENCOUNTER — Other Ambulatory Visit: Payer: Self-pay | Admitting: Sports Medicine

## 2012-10-07 ENCOUNTER — Other Ambulatory Visit: Payer: Self-pay

## 2012-10-20 ENCOUNTER — Other Ambulatory Visit: Payer: Self-pay

## 2012-11-14 ENCOUNTER — Encounter: Payer: Self-pay | Admitting: Sports Medicine

## 2012-11-14 ENCOUNTER — Ambulatory Visit (INDEPENDENT_AMBULATORY_CARE_PROVIDER_SITE_OTHER): Payer: Medicare Other | Admitting: Sports Medicine

## 2012-11-14 VITALS — BP 135/76 | HR 67 | Wt 136.0 lb

## 2012-11-14 DIAGNOSIS — M25569 Pain in unspecified knee: Secondary | ICD-10-CM

## 2012-11-14 DIAGNOSIS — M25562 Pain in left knee: Secondary | ICD-10-CM

## 2012-11-14 DIAGNOSIS — IMO0002 Reserved for concepts with insufficient information to code with codable children: Secondary | ICD-10-CM

## 2012-11-14 DIAGNOSIS — M1710 Unilateral primary osteoarthritis, unspecified knee: Secondary | ICD-10-CM

## 2012-11-14 DIAGNOSIS — Z23 Encounter for immunization: Secondary | ICD-10-CM

## 2012-11-14 DIAGNOSIS — J4489 Other specified chronic obstructive pulmonary disease: Secondary | ICD-10-CM

## 2012-11-14 DIAGNOSIS — J387 Other diseases of larynx: Secondary | ICD-10-CM

## 2012-11-14 DIAGNOSIS — K219 Gastro-esophageal reflux disease without esophagitis: Secondary | ICD-10-CM | POA: Insufficient documentation

## 2012-11-14 DIAGNOSIS — J449 Chronic obstructive pulmonary disease, unspecified: Secondary | ICD-10-CM

## 2012-11-14 DIAGNOSIS — I10 Essential (primary) hypertension: Secondary | ICD-10-CM

## 2012-11-14 DIAGNOSIS — M5412 Radiculopathy, cervical region: Secondary | ICD-10-CM

## 2012-11-14 MED ORDER — OMEPRAZOLE 40 MG PO CPDR: 40.0000 mg | DELAYED_RELEASE_CAPSULE | Freq: Two times a day (BID) | ORAL | Status: DC

## 2012-11-14 NOTE — Assessment & Plan Note (Signed)
Injection was 2 months ago, he did have some buckling of the knee suggestive of muscle atrophy. Steroid and Supartz placed into the knee today. Return in one week for injection #205. Formal physical therapy.

## 2012-11-14 NOTE — Assessment & Plan Note (Signed)
Well controlled, no changes 

## 2012-11-14 NOTE — Progress Notes (Signed)
  Subjective:    CC: Followup  HPI: Knee osteoarthritis: Improved significantly after the last injection 2 months ago but unfortunately pain his return. Localized at the joint lines, left-sided, moderate, persistent.  COPD: Stable on Symbicort and occasional albuterol, would like to see me every 6 weeks during the winter, as he has been hospitalized every year about this time.  Hypertension: Well controlled.  Excessive throat clearing: Also gets some substernal pain when lying flat at night, tends to clear his throat through the day. Has never been on an acid reflux medicine.  Past medical history, Surgical history, Family history not pertinant except as noted below, Social history, Allergies, and medications have been entered into the medical record, reviewed, and no changes needed.   Review of Systems: No fevers, chills, night sweats, weight loss, chest pain, or shortness of breath.   Objective:    General: Well Developed, well nourished, and in no acute distress.  Neuro: Alert and oriented x3, extra-ocular muscles intact, sensation grossly intact.  HEENT: Normocephalic, atraumatic, pupils equal round reactive to light, neck supple, no masses, no lymphadenopathy, thyroid nonpalpable.  Skin: Warm and dry, no rashes. Cardiac: Regular rate and rhythm, no murmurs rubs or gallops, no lower extremity edema.  Respiratory: Clear to auscultation bilaterally. Not using accessory muscles, speaking in full sentences.  Procedure: Real-time Ultrasound Guided Injection of left knee Device: GE Logiq E  Verbal informed consent obtained.  Time-out conducted.  Noted no overlying erythema, induration, or other signs of local infection.  Skin prepped in a sterile fashion.  Local anesthesia: Topical Ethyl chloride.  With sterile technique and under real time ultrasound guidance:  2 cc Kenalog 40, 4 cc lidocaine injected easily into the suprapatellar recess, syringe switched and 25 mg/2.5 mL of Supartz  (sodium hyaluronate) in a prefilled syringe was injected easily into the knee through a 22-gauge needle. Completed without difficulty  Pain immediately resolved suggesting accurate placement of the medication.  Advised to call if fevers/chills, erythema, induration, drainage, or persistent bleeding.  Images permanently stored and available for review in the ultrasound unit.  Impression: Technically successful ultrasound guided injection.  Impression and Recommendations:

## 2012-11-14 NOTE — Assessment & Plan Note (Signed)
Physical therapy. Epidurals were not very effective.

## 2012-11-14 NOTE — Assessment & Plan Note (Signed)
Continuing support with as needed albuterol. Steroids and antibiotics as needed. Up-to-date on influenza and pneumococcal vaccination.

## 2012-11-14 NOTE — Assessment & Plan Note (Signed)
Starting omeprazole at dinnertime.

## 2012-11-21 ENCOUNTER — Encounter: Payer: Self-pay | Admitting: Sports Medicine

## 2012-11-21 ENCOUNTER — Ambulatory Visit (INDEPENDENT_AMBULATORY_CARE_PROVIDER_SITE_OTHER): Payer: Medicare Other | Admitting: Sports Medicine

## 2012-11-21 VITALS — BP 131/67 | HR 69 | Wt 136.0 lb

## 2012-11-21 DIAGNOSIS — IMO0002 Reserved for concepts with insufficient information to code with codable children: Secondary | ICD-10-CM

## 2012-11-21 DIAGNOSIS — M1710 Unilateral primary osteoarthritis, unspecified knee: Secondary | ICD-10-CM

## 2012-11-21 DIAGNOSIS — M25562 Pain in left knee: Secondary | ICD-10-CM

## 2012-11-21 DIAGNOSIS — M25569 Pain in unspecified knee: Secondary | ICD-10-CM

## 2012-11-21 NOTE — Progress Notes (Signed)
   Procedure: Real-time Ultrasound Guided Injection of left knee Device: GE Logiq E  Verbal informed consent obtained.  Time-out conducted.  Noted no overlying erythema, induration, or other signs of local infection.  Skin prepped in a sterile fashion.  Local anesthesia: Topical Ethyl chloride.  With sterile technique and under real time ultrasound guidance: 25 mg/2.5 mL of Supartz (sodium hyaluronate) in a prefilled syringe was injected easily into the knee through a 22-gauge needle. Completed without difficulty  Pain immediately resolved suggesting accurate placement of the medication.  Advised to call if fevers/chills, erythema, induration, drainage, or persistent bleeding.  Images permanently stored and available for review in the ultrasound unit.  Impression: Technically successful ultrasound guided injection. 

## 2012-11-21 NOTE — Assessment & Plan Note (Signed)
Supartz injection #2 as above. Return in one week for #3 

## 2012-11-27 ENCOUNTER — Ambulatory Visit: Payer: Medicare Other

## 2012-11-27 DIAGNOSIS — M25569 Pain in unspecified knee: Secondary | ICD-10-CM

## 2012-11-27 DIAGNOSIS — R5381 Other malaise: Secondary | ICD-10-CM

## 2012-11-28 ENCOUNTER — Ambulatory Visit (INDEPENDENT_AMBULATORY_CARE_PROVIDER_SITE_OTHER): Payer: Medicare Other | Admitting: Sports Medicine

## 2012-11-28 ENCOUNTER — Encounter: Payer: Self-pay | Admitting: Sports Medicine

## 2012-11-28 VITALS — BP 135/75 | HR 71 | Wt 136.0 lb

## 2012-11-28 DIAGNOSIS — IMO0002 Reserved for concepts with insufficient information to code with codable children: Secondary | ICD-10-CM

## 2012-11-28 DIAGNOSIS — M25562 Pain in left knee: Secondary | ICD-10-CM

## 2012-11-28 DIAGNOSIS — M25569 Pain in unspecified knee: Secondary | ICD-10-CM

## 2012-11-28 DIAGNOSIS — M1710 Unilateral primary osteoarthritis, unspecified knee: Secondary | ICD-10-CM

## 2012-11-28 NOTE — Assessment & Plan Note (Signed)
Supartz injection #3 as above. Return in one week for #4.

## 2012-11-28 NOTE — Progress Notes (Signed)
   Procedure: Real-time Ultrasound Guided Injection of left knee Device: GE Logiq E  Verbal informed consent obtained.  Time-out conducted.  Noted no overlying erythema, induration, or other signs of local infection.  Skin prepped in a sterile fashion.  Local anesthesia: Topical Ethyl chloride.  With sterile technique and under real time ultrasound guidance: 25 mg/2.5 mL of Supartz (sodium hyaluronate) in a prefilled syringe was injected easily into the knee through a 22-gauge needle. Completed without difficulty  Pain immediately resolved suggesting accurate placement of the medication.  Advised to call if fevers/chills, erythema, induration, drainage, or persistent bleeding.  Images permanently stored and available for review in the ultrasound unit.  Impression: Technically successful ultrasound guided injection. 

## 2012-12-01 ENCOUNTER — Encounter: Payer: Medicare Other | Admitting: Physical Therapy

## 2012-12-01 DIAGNOSIS — R5381 Other malaise: Secondary | ICD-10-CM

## 2012-12-01 DIAGNOSIS — M25569 Pain in unspecified knee: Secondary | ICD-10-CM

## 2012-12-03 ENCOUNTER — Encounter: Payer: Medicare Other | Admitting: Physical Therapy

## 2012-12-03 DIAGNOSIS — M25569 Pain in unspecified knee: Secondary | ICD-10-CM

## 2012-12-03 DIAGNOSIS — R5381 Other malaise: Secondary | ICD-10-CM

## 2012-12-05 ENCOUNTER — Ambulatory Visit (INDEPENDENT_AMBULATORY_CARE_PROVIDER_SITE_OTHER): Payer: Medicare Other | Admitting: Sports Medicine

## 2012-12-05 ENCOUNTER — Encounter: Payer: Self-pay | Admitting: Sports Medicine

## 2012-12-05 VITALS — BP 118/70 | HR 82 | Wt 136.0 lb

## 2012-12-05 DIAGNOSIS — M1712 Unilateral primary osteoarthritis, left knee: Secondary | ICD-10-CM

## 2012-12-05 DIAGNOSIS — IMO0002 Reserved for concepts with insufficient information to code with codable children: Secondary | ICD-10-CM

## 2012-12-05 DIAGNOSIS — M1710 Unilateral primary osteoarthritis, unspecified knee: Secondary | ICD-10-CM

## 2012-12-05 NOTE — Assessment & Plan Note (Signed)
Excellent pain relief so far. Supartz injection #4 as above. Return in one week for #5

## 2012-12-05 NOTE — Progress Notes (Signed)
   Procedure: Real-time Ultrasound Guided Injection of left knee Device: GE Logiq E  Verbal informed consent obtained.  Time-out conducted.  Noted no overlying erythema, induration, or other signs of local infection.  Skin prepped in a sterile fashion.  Local anesthesia: Topical Ethyl chloride.  With sterile technique and under real time ultrasound guidance: 25 mg/2.5 mL of Supartz (sodium hyaluronate) in a prefilled syringe was injected easily into the knee through a 22-gauge needle. Completed without difficulty  Pain immediately resolved suggesting accurate placement of the medication.  Advised to call if fevers/chills, erythema, induration, drainage, or persistent bleeding.  Images permanently stored and available for review in the ultrasound unit.  Impression: Technically successful ultrasound guided injection. 

## 2012-12-08 ENCOUNTER — Encounter: Payer: Medicare Other | Admitting: Physical Therapy

## 2012-12-08 DIAGNOSIS — R5381 Other malaise: Secondary | ICD-10-CM

## 2012-12-08 DIAGNOSIS — M25569 Pain in unspecified knee: Secondary | ICD-10-CM

## 2012-12-11 DIAGNOSIS — M25569 Pain in unspecified knee: Secondary | ICD-10-CM

## 2012-12-11 DIAGNOSIS — R5381 Other malaise: Secondary | ICD-10-CM

## 2012-12-12 ENCOUNTER — Encounter: Payer: Self-pay | Admitting: Sports Medicine

## 2012-12-12 ENCOUNTER — Ambulatory Visit (INDEPENDENT_AMBULATORY_CARE_PROVIDER_SITE_OTHER): Payer: Medicare Other | Admitting: Sports Medicine

## 2012-12-12 VITALS — BP 126/70 | HR 81 | Wt 138.0 lb

## 2012-12-12 DIAGNOSIS — M1710 Unilateral primary osteoarthritis, unspecified knee: Secondary | ICD-10-CM

## 2012-12-12 DIAGNOSIS — M1712 Unilateral primary osteoarthritis, left knee: Secondary | ICD-10-CM

## 2012-12-12 DIAGNOSIS — IMO0002 Reserved for concepts with insufficient information to code with codable children: Secondary | ICD-10-CM

## 2012-12-12 NOTE — Progress Notes (Signed)
   Procedure: Real-time Ultrasound Guided Injection of left knee Device: GE Logiq E  Verbal informed consent obtained.  Time-out conducted.  Noted no overlying erythema, induration, or other signs of local infection.  Skin prepped in a sterile fashion.  Local anesthesia: Topical Ethyl chloride.  With sterile technique and under real time ultrasound guidance: 25 mg/2.5 mL of Supartz (sodium hyaluronate) in a prefilled syringe was injected easily into the knee through a 22-gauge needle. Completed without difficulty  Pain immediately resolved suggesting accurate placement of the medication.  Advised to call if fevers/chills, erythema, induration, drainage, or persistent bleeding.  Images permanently stored and available for review in the ultrasound unit.  Impression: Technically successful ultrasound guided injection. 

## 2012-12-12 NOTE — Assessment & Plan Note (Signed)
Supartz injection #5 of 5. He notes 75% improvement in pain after four shots. Return as needed.

## 2012-12-15 ENCOUNTER — Encounter: Payer: Medicare Other | Admitting: Physical Therapy

## 2012-12-15 DIAGNOSIS — M25569 Pain in unspecified knee: Secondary | ICD-10-CM

## 2012-12-15 DIAGNOSIS — R5381 Other malaise: Secondary | ICD-10-CM

## 2012-12-18 DIAGNOSIS — R5381 Other malaise: Secondary | ICD-10-CM

## 2012-12-18 DIAGNOSIS — M25569 Pain in unspecified knee: Secondary | ICD-10-CM

## 2012-12-19 ENCOUNTER — Ambulatory Visit (INDEPENDENT_AMBULATORY_CARE_PROVIDER_SITE_OTHER): Payer: Medicare Other

## 2012-12-19 ENCOUNTER — Ambulatory Visit (INDEPENDENT_AMBULATORY_CARE_PROVIDER_SITE_OTHER): Payer: Medicare Other | Admitting: Sports Medicine

## 2012-12-19 ENCOUNTER — Encounter: Payer: Self-pay | Admitting: Sports Medicine

## 2012-12-19 VITALS — BP 119/66 | HR 72 | Wt 137.0 lb

## 2012-12-19 DIAGNOSIS — R05 Cough: Secondary | ICD-10-CM

## 2012-12-19 DIAGNOSIS — IMO0002 Reserved for concepts with insufficient information to code with codable children: Secondary | ICD-10-CM

## 2012-12-19 DIAGNOSIS — R0989 Other specified symptoms and signs involving the circulatory and respiratory systems: Secondary | ICD-10-CM

## 2012-12-19 DIAGNOSIS — J449 Chronic obstructive pulmonary disease, unspecified: Secondary | ICD-10-CM

## 2012-12-19 DIAGNOSIS — M1712 Unilateral primary osteoarthritis, left knee: Secondary | ICD-10-CM

## 2012-12-19 DIAGNOSIS — R059 Cough, unspecified: Secondary | ICD-10-CM

## 2012-12-19 DIAGNOSIS — J4489 Other specified chronic obstructive pulmonary disease: Secondary | ICD-10-CM

## 2012-12-19 DIAGNOSIS — M1710 Unilateral primary osteoarthritis, unspecified knee: Secondary | ICD-10-CM

## 2012-12-19 MED ORDER — AZITHROMYCIN 250 MG PO TABS: ORAL_TABLET | ORAL | Status: DC

## 2012-12-19 MED ORDER — FLUTICASONE PROPIONATE 50 MCG/ACT NA SUSP: NASAL | Status: DC

## 2012-12-19 MED ORDER — PREDNISONE 50 MG PO TABS: 50.0000 mg | ORAL_TABLET | Freq: Every day | ORAL | Status: DC

## 2012-12-19 NOTE — Assessment & Plan Note (Signed)
Currently in mild acute exacerbation. Azithromycin, prednisone, Flonase, chest x-ray. Return if no better in a week.

## 2012-12-19 NOTE — Progress Notes (Signed)
  Subjective:    CC: Follow up  HPI: Knee osteoarthritis: We have finished the entire Supartz series on Eric Buchanan on his left knee, he tells me he is 95% pain free, he still has several future sessions scheduled. Very happy with results so far.  Cough: Has been present for approximately 3 weeks now, producing mucus, does have a history of COPD, and has been using his rescue inhaler more often, also associated with rhinorrhea. Symptoms are moderate, persistent. No shortness of breath, chest pain.  Past medical history, Surgical history, Family history not pertinant except as noted below, Social history, Allergies, and medications have been entered into the medical record, reviewed, and no changes needed.   Review of Systems: No fevers, chills, night sweats, weight loss, chest pain, or shortness of breath.   Objective:    General: Well Developed, well nourished, and in no acute distress.  Neuro: Alert and oriented x3, extra-ocular muscles intact, sensation grossly intact.  HEENT: Normocephalic, atraumatic, pupils equal round reactive to light, neck supple, no masses, no lymphadenopathy, thyroid nonpalpable.  Skin: Warm and dry, no rashes. Cardiac: Regular rate and rhythm, no murmurs rubs or gallops, no lower extremity edema.  Respiratory: Coarse sounds in the left upper lung field. Not using accessory muscles, speaking in full sentences.  Impression and Recommendations:

## 2012-12-19 NOTE — Assessment & Plan Note (Signed)
95% improved with physical therapy and a full series of Supartz.

## 2012-12-22 ENCOUNTER — Encounter: Payer: Medicare Other | Admitting: Physical Therapy

## 2012-12-22 ENCOUNTER — Encounter (INDEPENDENT_AMBULATORY_CARE_PROVIDER_SITE_OTHER): Payer: Self-pay

## 2012-12-22 DIAGNOSIS — R5381 Other malaise: Secondary | ICD-10-CM

## 2012-12-22 DIAGNOSIS — M25569 Pain in unspecified knee: Secondary | ICD-10-CM

## 2012-12-25 DIAGNOSIS — M25569 Pain in unspecified knee: Secondary | ICD-10-CM

## 2012-12-25 DIAGNOSIS — R5381 Other malaise: Secondary | ICD-10-CM

## 2012-12-26 ENCOUNTER — Encounter: Payer: Self-pay | Admitting: Sports Medicine

## 2012-12-26 ENCOUNTER — Ambulatory Visit (INDEPENDENT_AMBULATORY_CARE_PROVIDER_SITE_OTHER): Payer: Medicare Other | Admitting: Sports Medicine

## 2012-12-26 VITALS — BP 138/63 | HR 77 | Wt 140.0 lb

## 2012-12-26 DIAGNOSIS — M5412 Radiculopathy, cervical region: Secondary | ICD-10-CM

## 2012-12-26 DIAGNOSIS — M1712 Unilateral primary osteoarthritis, left knee: Secondary | ICD-10-CM

## 2012-12-26 DIAGNOSIS — M1710 Unilateral primary osteoarthritis, unspecified knee: Secondary | ICD-10-CM

## 2012-12-26 DIAGNOSIS — IMO0002 Reserved for concepts with insufficient information to code with codable children: Secondary | ICD-10-CM

## 2012-12-26 DIAGNOSIS — J449 Chronic obstructive pulmonary disease, unspecified: Secondary | ICD-10-CM

## 2012-12-26 DIAGNOSIS — J4489 Other specified chronic obstructive pulmonary disease: Secondary | ICD-10-CM

## 2012-12-26 MED ORDER — TRAMADOL HCL 50 MG PO TABS: ORAL_TABLET | ORAL | Status: DC

## 2012-12-26 NOTE — Assessment & Plan Note (Signed)
Mild exacerbation resolved with prednisone and antibiotics.

## 2012-12-26 NOTE — Progress Notes (Signed)
  Subjective:    CC: Followup  HPI: COPD exacerbation: Resolved with prednisone, bronchodilators, and azithromycin.  Knee osteoarthritis:  Completely resolved after finishing a Supartz series.  Cervical radiculitis: Continues to be well controlled with occasional tramadol.  Past medical history, Surgical history, Family history not pertinant except as noted below, Social history, Allergies, and medications have been entered into the medical record, reviewed, and no changes needed.   Review of Systems: No fevers, chills, night sweats, weight loss, chest pain, or shortness of breath.   Objective:    General: Well Developed, well nourished, and in no acute distress.  Neuro: Alert and oriented x3, extra-ocular muscles intact, sensation grossly intact.  HEENT: Normocephalic, atraumatic, pupils equal round reactive to light, neck supple, no masses, no lymphadenopathy, thyroid nonpalpable.  Skin: Warm and dry, no rashes. Cardiac: Regular rate and rhythm, no murmurs rubs or gallops, no lower extremity edema.  Respiratory: Clear to auscultation bilaterally. Not using accessory muscles, speaking in full sentences.  Impression and Recommendations:

## 2012-12-26 NOTE — Assessment & Plan Note (Signed)
Pain-free after a series of Supartz. Return as needed for this.

## 2012-12-26 NOTE — Assessment & Plan Note (Signed)
Refilling tramadol, symptoms are well controlled.

## 2013-01-14 ENCOUNTER — Other Ambulatory Visit: Payer: Self-pay

## 2013-01-19 ENCOUNTER — Telehealth: Payer: Self-pay

## 2013-01-19 ENCOUNTER — Other Ambulatory Visit: Payer: Self-pay

## 2013-01-19 NOTE — Telephone Encounter (Signed)
Patient called to request refills sent through mail order because he is no longer using [facility]. I called the patient back on 2 occassions to confirm which medications and the name of the mail order and left messages both times for patient to call me back. [name],CMA

## 2013-01-22 ENCOUNTER — Other Ambulatory Visit: Payer: Self-pay

## 2013-01-22 ENCOUNTER — Telehealth: Payer: Self-pay

## 2013-01-22 DIAGNOSIS — J449 Chronic obstructive pulmonary disease, unspecified: Secondary | ICD-10-CM

## 2013-01-22 MED ORDER — LISINOPRIL-HYDROCHLOROTHIAZIDE 20-25 MG PO TABS: 0.5000 | ORAL_TABLET | Freq: Every day | ORAL | Status: DC

## 2013-01-22 MED ORDER — FLUTICASONE PROPIONATE 50 MCG/ACT NA SUSP: NASAL | Status: DC

## 2013-01-22 MED ORDER — BUDESONIDE-FORMOTEROL FUMARATE 160-4.5 MCG/ACT IN AERO: 2.0000 | INHALATION_SPRAY | Freq: Two times a day (BID) | RESPIRATORY_TRACT | Status: DC

## 2013-01-22 NOTE — Telephone Encounter (Signed)
Patient request refill for Symbicort, Flonase, and Lisinopril HCTZ sent to new mail order which is [facility]. [name],CMA

## 2013-01-22 NOTE — Telephone Encounter (Signed)
Patient called at 10:29 am left a vm, I called patient back @ 11:49 and got his vm  So I left a message on patient vm asking patient to call me back with the mail order pharmacy to send his Rx to. Floreine Kingdon,CMA

## 2013-02-06 ENCOUNTER — Encounter: Payer: Self-pay | Admitting: Sports Medicine

## 2013-02-06 ENCOUNTER — Ambulatory Visit (INDEPENDENT_AMBULATORY_CARE_PROVIDER_SITE_OTHER): Payer: Medicare Other | Admitting: Sports Medicine

## 2013-02-06 VITALS — BP 124/66 | HR 69 | Wt 138.0 lb

## 2013-02-06 DIAGNOSIS — J387 Other diseases of larynx: Secondary | ICD-10-CM

## 2013-02-06 DIAGNOSIS — M25522 Pain in left elbow: Secondary | ICD-10-CM

## 2013-02-06 DIAGNOSIS — L989 Disorder of the skin and subcutaneous tissue, unspecified: Secondary | ICD-10-CM | POA: Insufficient documentation

## 2013-02-06 DIAGNOSIS — K219 Gastro-esophageal reflux disease without esophagitis: Secondary | ICD-10-CM

## 2013-02-06 DIAGNOSIS — M25529 Pain in unspecified elbow: Secondary | ICD-10-CM

## 2013-02-06 MED ORDER — ESOMEPRAZOLE MAGNESIUM 40 MG PO CPDR: 40.0000 mg | DELAYED_RELEASE_CAPSULE | Freq: Two times a day (BID) | ORAL | Status: DC

## 2013-02-06 MED ORDER — DOXYCYCLINE HYCLATE 100 MG PO TABS: 100.0000 mg | ORAL_TABLET | Freq: Two times a day (BID) | ORAL | Status: DC

## 2013-02-06 NOTE — Assessment & Plan Note (Signed)
Symptoms are persistent, I am going to switch to Nexium twice a day.

## 2013-02-06 NOTE — Progress Notes (Signed)
  Subjective:    CC: Followup  HPI: COPD exacerbation: Resolved.  GERD: Persistent symptoms of throat clearing despite omeprazole once a day.  Hypertension: Well controlled.  Elbow lesion: Started draining and was a little bit painful, he tried to squeeze it, but unfortunately has now hurting persistently, has been present for a couple of weeks.  Past medical history, Surgical history, Family history not pertinant except as noted below, Social history, Allergies, and medications have been entered into the medical record, reviewed, and no changes needed.   Review of Systems: No fevers, chills, night sweats, weight loss, chest pain, or shortness of breath.   Objective:    General: Well Developed, well nourished, and in no acute distress.  Neuro: Alert and oriented x3, extra-ocular muscles intact, sensation grossly intact.  HEENT: Normocephalic, atraumatic, pupils equal round reactive to light, neck supple, no masses, no lymphadenopathy, thyroid nonpalpable.  Skin: Warm and dry, no rashes. There is a dome-shaped lesion approximately 1 cm across over the left olecranon, it is minimally erythematous and tender to palpation. Cardiac: Regular rate and rhythm, no murmurs rubs or gallops, no lower extremity edema.  Respiratory: Clear to auscultation bilaterally. Not using accessory muscles, speaking in full sentences.  Impression and Recommendations:

## 2013-02-06 NOTE — Assessment & Plan Note (Signed)
There is what appears to be a soft tissue granuloma, it was infected. We are going to try a course of doxycycline, if no better I will perform excision in the office. Because this is transversely across the olecranon, I would need to place multiple reinforcing sutures to ensure that he doesn't tear the incision apart.

## 2013-02-13 ENCOUNTER — Other Ambulatory Visit: Payer: Self-pay | Admitting: Sports Medicine

## 2013-02-13 ENCOUNTER — Encounter: Payer: Self-pay | Admitting: Sports Medicine

## 2013-02-13 ENCOUNTER — Ambulatory Visit (INDEPENDENT_AMBULATORY_CARE_PROVIDER_SITE_OTHER): Payer: Medicare Other | Admitting: Sports Medicine

## 2013-02-13 VITALS — BP 141/71 | HR 72 | Wt 138.0 lb

## 2013-02-13 DIAGNOSIS — L989 Disorder of the skin and subcutaneous tissue, unspecified: Secondary | ICD-10-CM

## 2013-02-13 DIAGNOSIS — C44621 Squamous cell carcinoma of skin of unspecified upper limb, including shoulder: Secondary | ICD-10-CM

## 2013-02-13 NOTE — Progress Notes (Signed)
  Subjective:    CC: Followup  HPI: Left elbow lesion: Persistent and in fact enlarging since the last visit and since his course of doxycycline. Mildly tender, no constitutional symptoms. Pain is persistent without radiation. He desires excision.  Past medical history, Surgical history, Family history not pertinant except as noted below, Social history, Allergies, and medications have been entered into the medical record, reviewed, and no changes needed.   Review of Systems: No fevers, chills, night sweats, weight loss, chest pain, or shortness of breath.   Objective:    General: Well Developed, well nourished, and in no acute distress.  Neuro: Alert and oriented x3, extra-ocular muscles intact, sensation grossly intact.  HEENT: Normocephalic, atraumatic, pupils equal round reactive to light, neck supple, no masses, no lymphadenopathy, thyroid nonpalpable.  Skin: Warm and dry, no rashes. Cardiac: Regular rate and rhythm, no murmurs rubs or gallops, no lower extremity edema.  Respiratory: Clear to auscultation bilaterally. Not using accessory muscles, speaking in full sentences. Left elbow: There is a 2.1 cm dome-shaped lesion with a keratin filled core area and there is no sign of bacterial superinfection.  Procedure:  Excision of  left elbow dome-shaped lesion, 2.1 cm. 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 Risks, benefits, and alternatives explained and consent obtained. Time out conducted. Surface prepped with alcohol. 5cc lidocaine with epinephine infiltrated in a field block. Adequate anesthesia ensured. Area prepped and draped in a sterile fashion. Excision performed with: #15 blade used to make an elliptical incision around the lesion, afterward sharp dissection was carried through and lesion was fully removed, I undermined the skin on either side of the incision to decrease tension, I then placed a single horizontal mattress with 0-0 Prolene to reduce tension across the skin edges. Afterwards  I placed a running subcuticular 4-0 Vicryl suture beneath the skin. Steri-Strips were placed on top and a sterile dressing was applied. Hemostasis achieved. Pt stable.  The elbow was then strapped with a compressive dressing.  Impression and Recommendations:   I spent 40 minutes with this patient, greater than 50% was face-to-face time counseling regarding the elbow lesion.

## 2013-02-13 NOTE — Assessment & Plan Note (Addendum)
This did not respond to a course of doxycycline. 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 Complete excision performed today. Sent off for pathology. He will return to see me in one week for a wound check. Considering the friability of the skin I will probably leave in the reinforcing sutures for 1-1/2-2 weeks.  Pathology showed squama cell carcinoma, well differentiated with clear margins.

## 2013-02-13 NOTE — Addendum Note (Signed)
Addended by: Doree Albee on: 02/13/2013 04:31 PM   Modules accepted: Orders

## 2013-02-20 ENCOUNTER — Ambulatory Visit: Payer: Medicare Other | Admitting: Sports Medicine

## 2013-02-20 ENCOUNTER — Encounter: Payer: Self-pay | Admitting: Sports Medicine

## 2013-02-20 VITALS — BP 121/68 | HR 66 | Wt 138.0 lb

## 2013-02-20 DIAGNOSIS — C44629 Squamous cell carcinoma of skin of left upper limb, including shoulder: Secondary | ICD-10-CM

## 2013-02-20 NOTE — Progress Notes (Signed)
  Subjective: One week post excision of left elbow lesion.   Objective: General: Well-developed, well-nourished, and in no acute distress. Incision looks good, horizontal mattress bracing suture was removed.  Pathology results showed squamous cell carcinoma, well differentiated, with full excision and clear margins.  Assessment/plan:

## 2013-02-20 NOTE — Assessment & Plan Note (Signed)
Incision is doing very well one week post excision. Mattress suture removed, running subcuticular suture will stay in place. Pathology results showed well-differentiated squamous cell carcinoma with free margins. Return in 6 weeks.

## 2013-02-26 ENCOUNTER — Telehealth: Payer: Self-pay

## 2013-02-26 MED ORDER — SIMVASTATIN 10 MG PO TABS: 10.0000 mg | ORAL_TABLET | Freq: Every day | ORAL | Status: DC

## 2013-02-26 NOTE — Telephone Encounter (Signed)
Patient request refill for Simvastatin sent to [facility]. Casy Tavano,CMA

## 2013-02-27 ENCOUNTER — Telehealth: Payer: Self-pay

## 2013-02-27 NOTE — Telephone Encounter (Signed)
Patient called and left a message on nurse line asking for a return call. He stated he needs a 10 day prescription of simvastatin.   Returned Call: Left message asking patient to call back.

## 2013-03-03 MED ORDER — SIMVASTATIN 10 MG PO TABS: 10.0000 mg | ORAL_TABLET | Freq: Every day | ORAL | Status: DC

## 2013-03-19 ENCOUNTER — Telehealth: Payer: Self-pay

## 2013-03-19 MED ORDER — TRAMADOL HCL 50 MG PO TABS: ORAL_TABLET | ORAL | Status: DC

## 2013-03-19 NOTE — Telephone Encounter (Signed)
Patient request refill for Tramadol. Please advise patient wants it faxed to [facility]. [name],CMA

## 2013-03-19 NOTE — Telephone Encounter (Signed)
Rx in box to be faxed to [facility]

## 2013-03-20 ENCOUNTER — Other Ambulatory Visit: Payer: Self-pay

## 2013-03-20 NOTE — Telephone Encounter (Signed)
Rx in provider box waiting to be signed so I can fax to [facility]. [name],CMA

## 2013-03-23 ENCOUNTER — Telehealth: Payer: Self-pay

## 2013-03-23 MED ORDER — PREDNISONE 50 MG PO TABS: 50.0000 mg | ORAL_TABLET | Freq: Every day | ORAL | Status: DC

## 2013-03-23 MED ORDER — DOXYCYCLINE HYCLATE 100 MG PO TABS: 100.0000 mg | ORAL_TABLET | Freq: Two times a day (BID) | ORAL | Status: AC

## 2013-03-23 NOTE — Telephone Encounter (Signed)
Patient called  stated that he has a real nasty cough and a sore throat he wants to know what he can take for it without having to come in?

## 2013-03-23 NOTE — Telephone Encounter (Signed)
Doxycycline, prednisone, continue inhalers.

## 2013-03-30 ENCOUNTER — Telehealth: Payer: Self-pay

## 2013-03-30 DIAGNOSIS — M5412 Radiculopathy, cervical region: Secondary | ICD-10-CM

## 2013-03-30 NOTE — Telephone Encounter (Signed)
Ordered epidural right-sided C7-T1 level.

## 2013-03-30 NOTE — Assessment & Plan Note (Signed)
Most recent epidural was in May, had a fantastic response, this was done at the right-sided C7-T1 level. Order repeat epidural.

## 2013-03-30 NOTE — Telephone Encounter (Signed)
Patient called stated that he is still experiencing neck pain he stated that the Tramadol is not working and Aleve is not working he request an order to have an epidural injection done. Calahan Pak,CMA

## 2013-04-01 ENCOUNTER — Ambulatory Visit
Admission: RE | Admit: 2013-04-01 | Discharge: 2013-04-01 | Disposition: A | Discharge status: Home / Self Care | Payer: Medicare Other | Source: Ambulatory Visit | Attending: Sports Medicine | Admitting: Sports Medicine

## 2013-04-01 MED ORDER — TRIAMCINOLONE ACETONIDE 40 MG/ML IJ SUSP (RADIOLOGY)
60.0000 mg | Freq: Once | INTRAMUSCULAR | Status: AC
  Administered 2013-04-01: 60 mg via EPIDURAL

## 2013-04-01 MED ORDER — IOHEXOL 300 MG/ML  SOLN
1.0000 mL | Freq: Once | INTRAMUSCULAR | Status: AC | PRN
  Administered 2013-04-01: 1 mL via EPIDURAL

## 2013-04-01 NOTE — Discharge Instructions (Signed)

## 2013-04-03 ENCOUNTER — Ambulatory Visit (INDEPENDENT_AMBULATORY_CARE_PROVIDER_SITE_OTHER): Payer: Medicare Other | Admitting: Sports Medicine

## 2013-04-03 ENCOUNTER — Encounter: Payer: Self-pay | Admitting: Sports Medicine

## 2013-04-03 VITALS — BP 151/77 | HR 80 | Wt 138.0 lb

## 2013-04-03 DIAGNOSIS — M5412 Radiculopathy, cervical region: Secondary | ICD-10-CM

## 2013-04-03 DIAGNOSIS — C44629 Squamous cell carcinoma of skin of left upper limb, including shoulder: Secondary | ICD-10-CM

## 2013-04-03 DIAGNOSIS — M1712 Unilateral primary osteoarthritis, left knee: Secondary | ICD-10-CM

## 2013-04-03 DIAGNOSIS — J387 Other diseases of larynx: Secondary | ICD-10-CM

## 2013-04-03 DIAGNOSIS — K219 Gastro-esophageal reflux disease without esophagitis: Secondary | ICD-10-CM

## 2013-04-03 DIAGNOSIS — IMO0002 Reserved for concepts with insufficient information to code with codable children: Secondary | ICD-10-CM

## 2013-04-03 DIAGNOSIS — M1710 Unilateral primary osteoarthritis, unspecified knee: Secondary | ICD-10-CM

## 2013-04-03 DIAGNOSIS — L989 Disorder of the skin and subcutaneous tissue, unspecified: Secondary | ICD-10-CM

## 2013-04-03 MED ORDER — ESOMEPRAZOLE MAGNESIUM 40 MG PO CPDR: 40.0000 mg | DELAYED_RELEASE_CAPSULE | Freq: Two times a day (BID) | ORAL | Status: DC

## 2013-04-03 NOTE — Assessment & Plan Note (Signed)
Continues to do well 

## 2013-04-03 NOTE — Assessment & Plan Note (Signed)
Margins were clear, scar is healing well however he does have some subcutaneous nodules/hypertrophic scarring. Intralesional injection as above.

## 2013-04-03 NOTE — Progress Notes (Signed)
  Subjective:    CC: Followup  HPI: Cervical spondylosis: Excellent response to last epidural in May of 2014, desires repeat.  Squamous cell carcinoma : status post excision with free margins from his left elbow, incision is healed well however he is getting some nodules and some minimal pain, no drainage.  Hypertension: Elevated today but typically well controlled on his current dose of medications, he is in pain due to his cervical spine..  Past medical history, Surgical history, Family history not pertinant except as noted below, Social history, Allergies, and medications have been entered into the medical record, reviewed, and no changes needed.   Review of Systems: No fevers, chills, night sweats, weight loss, chest pain, or shortness of breath.   Objective:    General: Well Developed, well nourished, and in no acute distress.  Neuro: Alert and oriented x3, extra-ocular muscles intact, sensation grossly intact.  HEENT: Normocephalic, atraumatic, pupils equal round reactive to light, neck supple, no masses, no lymphadenopathy, thyroid nonpalpable.  Skin: Warm and dry, no rashes. There are 2 palpable nodules over the well-healed incision on his left elbow. Cardiac: Regular rate and rhythm, no murmurs rubs or gallops, no lower extremity edema.  Respiratory: Clear to auscultation bilaterally. Not using accessory muscles, speaking in full sentences.  Procedure:  Injection of intralesional steroid injection over the left elbow scar Consent obtained and verified. Time-out conducted. Noted no overlying erythema, induration, or other signs of local infection. Skin prepped in a sterile fashion. Topical analgesic spray: Ethyl chloride. Completed without difficulty. Meds: 1 cc Kenalog 40, 1 cc lidocaine injected into the scar easily. Advised to call if fevers/chills, erythema, induration, drainage, or persistent bleeding.  Impression and Recommendations:

## 2013-04-03 NOTE — Assessment & Plan Note (Signed)
Steadily improving after epidural 2 days ago, his last epidural was in May of 2014.

## 2013-04-06 ENCOUNTER — Telehealth: Payer: Self-pay

## 2013-04-06 DIAGNOSIS — J449 Chronic obstructive pulmonary disease, unspecified: Secondary | ICD-10-CM

## 2013-04-06 MED ORDER — FLUTICASONE PROPIONATE 50 MCG/ACT NA SUSP: NASAL | Status: DC

## 2013-04-06 NOTE — Telephone Encounter (Signed)
Refill request for Flonase 50 mcg sent to [facility]. [name],CMA

## 2013-05-05 ENCOUNTER — Other Ambulatory Visit: Payer: Self-pay | Admitting: Sports Medicine

## 2013-05-05 MED ORDER — ALLOPURINOL 100 MG PO TABS: 100.0000 mg | ORAL_TABLET | Freq: Every day | ORAL | Status: DC

## 2013-05-15 ENCOUNTER — Encounter: Payer: Self-pay | Admitting: Sports Medicine

## 2013-05-15 ENCOUNTER — Ambulatory Visit (INDEPENDENT_AMBULATORY_CARE_PROVIDER_SITE_OTHER): Payer: Medicare Other | Admitting: Sports Medicine

## 2013-05-15 VITALS — BP 129/66 | HR 86 | Wt 141.0 lb

## 2013-05-15 DIAGNOSIS — D692 Other nonthrombocytopenic purpura: Secondary | ICD-10-CM

## 2013-05-15 DIAGNOSIS — K4090 Unilateral inguinal hernia, without obstruction or gangrene, not specified as recurrent: Secondary | ICD-10-CM

## 2013-05-15 DIAGNOSIS — C44621 Squamous cell carcinoma of skin of unspecified upper limb, including shoulder: Secondary | ICD-10-CM

## 2013-05-15 DIAGNOSIS — C44629 Squamous cell carcinoma of skin of left upper limb, including shoulder: Secondary | ICD-10-CM

## 2013-05-15 HISTORY — DX: Unilateral inguinal hernia, without obstruction or gangrene, not specified as recurrent: K40.90

## 2013-05-15 NOTE — Assessment & Plan Note (Signed)
Referral to general surgery for consideration of repair.

## 2013-05-15 NOTE — Progress Notes (Signed)
  Subjective:    CC: Discuss a couple of issues  HPI: Inguinal hernia: Right-sided, present for years, initially was not bothersome but more recently, he has had a worsening cough, and has a bit more protuberance, without any pain, constitutional symptoms, or bowel or bladder changes.  Left elbow scar: Several months ago I performed an excision of a squamous cell carcinoma from his left elbow, all margins were clear with pathology, unfortunately he is starting to have some discomfort and nodules at the incision site.  Past medical history, Surgical history, Family history not pertinant except as noted below, Social history, Allergies, and medications have been entered into the medical record, reviewed, and no changes needed.   Review of Systems: No fevers, chills, night sweats, weight loss, chest pain, or shortness of breath.   Objective:    General: Well Developed, well nourished, and in no acute distress.  Neuro: Alert and oriented x3, extra-ocular muscles intact, sensation grossly intact.  HEENT: Normocephalic, atraumatic, pupils equal round reactive to light, neck supple, no masses, no lymphadenopathy, thyroid nonpalpable.  Skin: Warm and dry, no rashes. Left elbow incision is well healed, there are couple of nodules that resemble scar/mild suture reaction. No sign of bacterial infection. No sign of recurrence. Cardiac: Regular rate and rhythm, no murmurs rubs or gallops, no lower extremity edema.  Respiratory: Clear to auscultation bilaterally. Not using accessory muscles, speaking in full sentences. Abdomen: Soft, nontender, nondistended, normal bowel sounds, residual protuberance in the right pelvis that worsens with Valsalva. No guarding, no rigidity, no rebound tenderness.  Impression and Recommendations:

## 2013-05-15 NOTE — Assessment & Plan Note (Signed)
He does have some persistence of scar tissue, this is nontender, it will likely resolve, the excision of the squamous cell carcinoma showed clear margins.

## 2013-05-15 NOTE — Assessment & Plan Note (Signed)
No cure, adding high-dose vitamin C over-the-counter.

## 2013-05-19 ENCOUNTER — Telehealth: Payer: Self-pay

## 2013-05-19 NOTE — Telephone Encounter (Signed)
Patient called stated that he does not want to have the hernia surgery done due to him being afraid to be put to sleep, he stated that the hernia does not hurt but its just bothersome, he wants to know what will happen if he do not get the surgery?

## 2013-05-20 NOTE — Telephone Encounter (Signed)
Probably nothing, just because he sees a general surgeon doesn't mean he will be rushed into the or.  There is a chance it could get stuck necessitating a more extensive surgery, i want an additional opinion from the surgeon.

## 2013-05-20 NOTE — Telephone Encounter (Signed)
LMOM for pt to return call. Clemetine Marker, LPN

## 2013-05-21 NOTE — Telephone Encounter (Signed)
Spoke to patient advised him that referral was already placed to see a general surgeon for hernia he stated that he will get a 2nd opinion and follow up with PCP to discuss.  Please adviseHe did state that it has been bothering him on yesterday and it was tender and hot.Suanne Marker Cunningham,CMA

## 2013-05-21 NOTE — Telephone Encounter (Signed)
That triples the reason he needs to see a Education officer, environmental.

## 2013-05-22 NOTE — Telephone Encounter (Signed)
Patient has been aware and he is going to the general surgeon for a 2nd opinion and he will follow up with PCP afterwards. Darinda [place],CMA

## 2013-06-11 ENCOUNTER — Other Ambulatory Visit: Payer: Self-pay

## 2013-06-11 ENCOUNTER — Telehealth: Payer: Self-pay

## 2013-06-11 MED ORDER — TRAMADOL HCL 50 MG PO TABS
ORAL_TABLET | ORAL | Status: DC
Start: 2013-06-11 — End: 2013-09-03

## 2013-06-11 NOTE — Telephone Encounter (Signed)
Rx in box. 

## 2013-06-11 NOTE — Telephone Encounter (Signed)
Patient request refill for Tramadol  Sent to [facility] mail order. Hiba Garry,CMA

## 2013-06-12 NOTE — Telephone Encounter (Signed)
Rx has been faxed to [place]. [name],CMA

## 2013-06-18 ENCOUNTER — Telehealth: Payer: Self-pay

## 2013-06-18 DIAGNOSIS — M5030 Other cervical disc degeneration, unspecified cervical region: Secondary | ICD-10-CM

## 2013-06-18 NOTE — Telephone Encounter (Signed)
Patient called request an order for cervical injection he stated that his pain is keeping up at night for the past 3 days and he needs some relief. Adlean Hardeman,CMA

## 2013-06-19 ENCOUNTER — Ambulatory Visit
Admission: RE | Admit: 2013-06-19 | Discharge: 2013-06-19 | Disposition: A | Discharge status: Home / Self Care | Payer: Medicare Other | Source: Ambulatory Visit | Attending: Sports Medicine | Admitting: Sports Medicine

## 2013-06-19 MED ORDER — IOHEXOL 300 MG/ML  SOLN
1.0000 mL | Freq: Once | INTRAMUSCULAR | Status: AC | PRN
  Administered 2013-06-19: 1 mL via EPIDURAL

## 2013-06-19 MED ORDER — TRIAMCINOLONE ACETONIDE 40 MG/ML IJ SUSP (RADIOLOGY)
60.0000 mg | Freq: Once | INTRAMUSCULAR | Status: AC
  Administered 2013-06-19: 60 mg via EPIDURAL

## 2013-06-19 NOTE — Telephone Encounter (Signed)
Orders placed.

## 2013-06-19 NOTE — Telephone Encounter (Signed)
Pt informed.  Niyla Marone, LPN  

## 2013-06-26 ENCOUNTER — Other Ambulatory Visit: Payer: Self-pay | Admitting: Sports Medicine

## 2013-06-26 DIAGNOSIS — K219 Gastro-esophageal reflux disease without esophagitis: Secondary | ICD-10-CM

## 2013-06-26 MED ORDER — ESOMEPRAZOLE MAGNESIUM 40 MG PO CPDR: 40.0000 mg | DELAYED_RELEASE_CAPSULE | Freq: Every day | ORAL | Status: DC

## 2013-07-31 ENCOUNTER — Other Ambulatory Visit: Payer: Self-pay | Admitting: *Deleted

## 2013-07-31 DIAGNOSIS — K219 Gastro-esophageal reflux disease without esophagitis: Secondary | ICD-10-CM

## 2013-07-31 MED ORDER — ESOMEPRAZOLE MAGNESIUM 40 MG PO CPDR: 40.0000 mg | DELAYED_RELEASE_CAPSULE | Freq: Every day | ORAL | Status: DC

## 2013-07-31 NOTE — Telephone Encounter (Signed)
Refill sent to pharmacy for nexium. Margette Fast, CMA

## 2013-08-20 ENCOUNTER — Other Ambulatory Visit: Payer: Self-pay | Admitting: Sports Medicine

## 2013-08-20 DIAGNOSIS — J449 Chronic obstructive pulmonary disease, unspecified: Secondary | ICD-10-CM

## 2013-08-20 MED ORDER — BUDESONIDE-FORMOTEROL FUMARATE 160-4.5 MCG/ACT IN AERO: 2.0000 | INHALATION_SPRAY | Freq: Two times a day (BID) | RESPIRATORY_TRACT | Status: DC

## 2013-08-21 ENCOUNTER — Telehealth: Payer: Self-pay

## 2013-08-21 NOTE — Telephone Encounter (Signed)
Patient has been informed. [name],CMA  

## 2013-08-21 NOTE — Telephone Encounter (Signed)
Patient called confused about how he should be taking the Nexium  He wants to know if he should be taking nexium 2 times a day or if he should be taking it 1 time a day at noon. [name],CMA

## 2013-08-21 NOTE — Telephone Encounter (Signed)
At this point he can do one tab daily. Somewhere between noon and dinnertime is a good time to take it.

## 2013-08-27 ENCOUNTER — Ambulatory Visit (INDEPENDENT_AMBULATORY_CARE_PROVIDER_SITE_OTHER): Payer: Medicare Other | Admitting: Sports Medicine

## 2013-08-27 ENCOUNTER — Encounter: Payer: Self-pay | Admitting: Sports Medicine

## 2013-08-27 VITALS — BP 114/63 | HR 80 | Wt 142.0 lb

## 2013-08-27 DIAGNOSIS — M1712 Unilateral primary osteoarthritis, left knee: Secondary | ICD-10-CM

## 2013-08-27 DIAGNOSIS — M1710 Unilateral primary osteoarthritis, unspecified knee: Secondary | ICD-10-CM

## 2013-08-27 NOTE — Assessment & Plan Note (Signed)
Aspiration and injection with steroid and OrthoVisc. Return in one week for injection #2.

## 2013-08-27 NOTE — Progress Notes (Signed)
  Subjective:    CC: Left knee pain  HPI: Eric Buchanan is a very pleasant 80 year old male with left knee osteoarthritis, we injected him approximately a year ago, he did extremely well but is now having a recurrence in pain and swelling in the left knee with joint line pain, no mechanical symptoms, gelling, moderate, persistent.  Past medical history, Surgical history, Family history not pertinant except as noted below, Social history, Allergies, and medications have been entered into the medical record, reviewed, and no changes needed.   Review of Systems: No fevers, chills, night sweats, weight loss, chest pain, or shortness of breath.   Objective:    General: Well Developed, well nourished, and in no acute distress.  Neuro: Alert and oriented x3, extra-ocular muscles intact, sensation grossly intact.  HEENT: Normocephalic, atraumatic, pupils equal round reactive to light, neck supple, no masses, no lymphadenopathy, thyroid nonpalpable.  Skin: Warm and dry, no rashes. Cardiac: Regular rate and rhythm, no murmurs rubs or gallops, no lower extremity edema.  Respiratory: Clear to auscultation bilaterally. Not using accessory muscles, speaking in full sentences. Left Knee: Visible and palpable effusion. ROM normal in flexion and extension and lower leg rotation. Ligaments with solid consistent endpoints including ACL, PCL, LCL, MCL. Negative Mcmurray's and provocative meniscal tests. Non painful patellar compression. Patellar and quadriceps tendons unremarkable. Hamstring and quadriceps strength is normal.  Procedure: Real-time Ultrasound Guided aspiration/Injection of left knee Device: GE Logiq E  Verbal informed consent obtained.  Time-out conducted.  Noted no overlying erythema, induration, or other signs of local infection.  Skin prepped in a sterile fashion.  Local anesthesia: Topical Ethyl chloride.  With sterile technique and under real time ultrasound guidance:  Aspirated 37 cc of  straw-colored fluid, syringe switched in 2 cc kenalog 40, 4 cc lidocaine injected easily, syringe again switched and 30 mg/2 mL of OrthoVisc (sodium hyaluronate) in a prefilled syringe was injected easily into the knee through a 18-gauge needle. Completed without difficulty  Pain immediately resolved suggesting accurate placement of the medication.  Advised to call if fevers/chills, erythema, induration, drainage, or persistent bleeding.  Images permanently stored and available for review in the ultrasound unit.  Impression: Technically successful ultrasound guided injection.  Impression and Recommendations:

## 2013-09-02 ENCOUNTER — Other Ambulatory Visit: Payer: Self-pay

## 2013-09-02 MED ORDER — SIMVASTATIN 10 MG PO TABS: 10.0000 mg | ORAL_TABLET | Freq: Every day | ORAL | Status: DC

## 2013-09-02 NOTE — Telephone Encounter (Signed)
Patient request refill for Simvastatin. [name],CMA

## 2013-09-03 ENCOUNTER — Ambulatory Visit (INDEPENDENT_AMBULATORY_CARE_PROVIDER_SITE_OTHER): Payer: Medicare Other | Admitting: Sports Medicine

## 2013-09-03 ENCOUNTER — Encounter: Payer: Self-pay | Admitting: Sports Medicine

## 2013-09-03 VITALS — BP 144/66 | HR 74 | Wt 139.0 lb

## 2013-09-03 DIAGNOSIS — M1712 Unilateral primary osteoarthritis, left knee: Secondary | ICD-10-CM

## 2013-09-03 DIAGNOSIS — M1710 Unilateral primary osteoarthritis, unspecified knee: Secondary | ICD-10-CM

## 2013-09-03 DIAGNOSIS — M5412 Radiculopathy, cervical region: Secondary | ICD-10-CM

## 2013-09-03 MED ORDER — SIMVASTATIN 10 MG PO TABS: 10.0000 mg | ORAL_TABLET | Freq: Every day | ORAL | Status: DC

## 2013-09-03 MED ORDER — TRAMADOL HCL 50 MG PO TABS: ORAL_TABLET | ORAL | Status: DC

## 2013-09-03 NOTE — Progress Notes (Signed)
  Procedure: Real-time Ultrasound Guided Injection of left knee Device: GE Logiq E  Verbal informed consent obtained.  Time-out conducted.  Noted no overlying erythema, induration, or other signs of local infection.  Skin prepped in a sterile fashion.  Local anesthesia: Topical Ethyl chloride.  With sterile technique and under real time ultrasound guidance:  Aspirated 15 cc of straw-colored fluid, syringe switched and 30 mg/2 mL of OrthoVisc (sodium hyaluronate) in a prefilled syringe was injected easily into the knee through a 22-gauge needle. Completed without difficulty  Pain immediately resolved suggesting accurate placement of the medication.  Advised to call if fevers/chills, erythema, induration, drainage, or persistent bleeding.  Images permanently stored and available for review in the ultrasound unit.  Impression: Technically successful ultrasound guided injection.

## 2013-09-03 NOTE — Assessment & Plan Note (Signed)
Doing well, no epidurals needed.

## 2013-09-03 NOTE — Assessment & Plan Note (Signed)
OrthoVisc injection #2 into the left knee. Return in one week #3.

## 2013-09-11 ENCOUNTER — Ambulatory Visit (INDEPENDENT_AMBULATORY_CARE_PROVIDER_SITE_OTHER): Payer: Medicare Other | Admitting: Sports Medicine

## 2013-09-11 ENCOUNTER — Encounter: Payer: Self-pay | Admitting: Sports Medicine

## 2013-09-11 VITALS — BP 118/63 | HR 78 | Wt 143.0 lb

## 2013-09-11 DIAGNOSIS — R5383 Other fatigue: Secondary | ICD-10-CM | POA: Insufficient documentation

## 2013-09-11 DIAGNOSIS — R5382 Chronic fatigue, unspecified: Secondary | ICD-10-CM

## 2013-09-11 DIAGNOSIS — M1712 Unilateral primary osteoarthritis, left knee: Secondary | ICD-10-CM

## 2013-09-11 DIAGNOSIS — M1710 Unilateral primary osteoarthritis, unspecified knee: Secondary | ICD-10-CM

## 2013-09-11 DIAGNOSIS — R5381 Other malaise: Secondary | ICD-10-CM

## 2013-09-11 NOTE — Progress Notes (Signed)
  Subjective:    CC: Followup  HPI: Fatigue: Nonspecific, simply feels tired. No depressive symptoms.  Osteoarthritis of the left knee: Here for OrthoVisc injection  Past medical history, Surgical history, Family history not pertinant except as noted below, Social history, Allergies, and medications have been entered into the medical record, reviewed, and no changes needed.   Review of Systems: No fevers, chills, night sweats, weight loss, chest pain, or shortness of breath.   Objective:    General: Well Developed, well nourished, and in no acute distress.  Neuro: Alert and oriented x3, extra-ocular muscles intact, sensation grossly intact.  HEENT: Normocephalic, atraumatic, pupils equal round reactive to light, neck supple, no masses, no lymphadenopathy, thyroid nonpalpable.  Skin: Warm and dry, no rashes. Cardiac: Regular rate and rhythm, no murmurs rubs or gallops, no lower extremity edema.  Respiratory: Clear to auscultation bilaterally. Not using accessory muscles, speaking in full sentences.  Procedure: Real-time Ultrasound Guided Injection of left knee Device: GE Logiq E  Verbal informed consent obtained.  Time-out conducted.  Noted no overlying erythema, induration, or other signs of local infection.  Skin prepped in a sterile fashion.  Local anesthesia: Topical Ethyl chloride.  With sterile technique and under real time ultrasound guidance: Aspirated 35 cc of straw-colored fluid, syringe switched and 30 mg/2 mL of OrthoVisc (sodium hyaluronate) in a prefilled syringe was injected easily into the knee through a 18-gauge needle.   Completed without difficulty  Pain immediately resolved suggesting accurate placement of the medication.  Advised to call if fevers/chills, erythema, induration, drainage, or persistent bleeding.  Images permanently stored and available for review in the ultrasound unit.  Impression: Technically successful ultrasound guided injection.  Impression  and Recommendations:

## 2013-09-11 NOTE — Assessment & Plan Note (Signed)
Aspiration and OrthoVisc injection #3. Return in one week for #4.

## 2013-09-11 NOTE — Assessment & Plan Note (Signed)
Checking some blood work.

## 2013-09-12 LAB — TSH: TSH: 1.608 u[IU]/mL (ref 0.350–4.500)

## 2013-09-12 LAB — CBC
HCT: 37.1 % — ABNORMAL LOW (ref 39.0–52.0)
Hemoglobin: 12.8 g/dL — ABNORMAL LOW (ref 13.0–17.0)
MCH: 31.9 pg (ref 26.0–34.0)
MCHC: 34.5 g/dL (ref 30.0–36.0)
MCV: 92.5 fL (ref 78.0–100.0)
Platelets: 266 10*3/uL (ref 150–400)
RBC: 4.01 MIL/uL — ABNORMAL LOW (ref 4.22–5.81)
RDW: 14.3 % (ref 11.5–15.5)
WBC: 7.7 10*3/uL (ref 4.0–10.5)

## 2013-09-12 LAB — COMPREHENSIVE METABOLIC PANEL
ALT: 18 U/L (ref 0–53)
Alkaline Phosphatase: 75 U/L (ref 39–117)
BUN: 24 mg/dL — ABNORMAL HIGH (ref 6–23)
CO2: 26 mEq/L (ref 19–32)
Creat: 1.32 mg/dL (ref 0.50–1.35)
Glucose, Bld: 81 mg/dL (ref 70–99)
Sodium: 135 mEq/L (ref 135–145)
Total Bilirubin: 0.6 mg/dL (ref 0.2–1.2)
Total Protein: 6.3 g/dL (ref 6.0–8.3)

## 2013-09-12 LAB — COMPREHENSIVE METABOLIC PANEL WITH GFR
AST: 19 U/L (ref 0–37)
Albumin: 4.3 g/dL (ref 3.5–5.2)
Calcium: 9.5 mg/dL (ref 8.4–10.5)
Chloride: 98 meq/L (ref 96–112)
Potassium: 4.4 meq/L (ref 3.5–5.3)

## 2013-09-12 LAB — TESTOSTERONE: Testosterone: 745 ng/dL (ref 300–890)

## 2013-09-18 ENCOUNTER — Ambulatory Visit (INDEPENDENT_AMBULATORY_CARE_PROVIDER_SITE_OTHER): Payer: Medicare Other | Admitting: Sports Medicine

## 2013-09-18 DIAGNOSIS — M1712 Unilateral primary osteoarthritis, left knee: Secondary | ICD-10-CM

## 2013-09-18 DIAGNOSIS — M1710 Unilateral primary osteoarthritis, unspecified knee: Secondary | ICD-10-CM

## 2013-09-18 NOTE — Assessment & Plan Note (Signed)
Aspiration OrthoVisc injection #4 of 4 into the left knee. Ice the knee 20 minutes 3-4 times per day, return in 5 weeks.

## 2013-09-18 NOTE — Progress Notes (Signed)
    Procedure: Real-time Ultrasound Guided Injection of left knee Device: GE Logiq E  Verbal informed consent obtained.  Time-out conducted.  Noted no overlying erythema, induration, or other signs of local infection.  Skin prepped in a sterile fashion.  Local anesthesia: Topical Ethyl chloride.  With sterile technique and under real time ultrasound guidance: Aspirated 35 cc of straw-colored fluid, syringe switched and 30 mg/2 mL of OrthoVisc (sodium hyaluronate) in a prefilled syringe was injected easily into the knee through a 18-gauge needle.   Completed without difficulty  Pain immediately resolved suggesting accurate placement of the medication.  Advised to call if fevers/chills, erythema, induration, drainage, or persistent bleeding.  Images permanently stored and available for review in the ultrasound unit.  Impression: Technically successful ultrasound guided injection.  Impression and Recommendations:

## 2013-10-14 ENCOUNTER — Telehealth: Payer: Self-pay

## 2013-10-14 DIAGNOSIS — M47812 Spondylosis without myelopathy or radiculopathy, cervical region: Secondary | ICD-10-CM

## 2013-10-14 NOTE — Telephone Encounter (Signed)
Patient called stated that he would like a referral for to get a cervical injection done. Lakeitha Basques,CMA

## 2013-10-14 NOTE — Telephone Encounter (Signed)
Orders placed for right-sided C6-C7 interlaminar epidural.

## 2013-10-14 NOTE — Telephone Encounter (Signed)
Called GI left a message for Danyelle to call patient and schedule. [name],CMA

## 2013-10-16 ENCOUNTER — Ambulatory Visit
Admission: RE | Admit: 2013-10-16 | Discharge: 2013-10-16 | Disposition: A | Discharge status: Home / Self Care | Payer: Medicare Other | Source: Ambulatory Visit | Attending: Sports Medicine | Admitting: Sports Medicine

## 2013-10-16 DIAGNOSIS — J45909 Unspecified asthma, uncomplicated: Secondary | ICD-10-CM | POA: Insufficient documentation

## 2013-10-16 DIAGNOSIS — I639 Cerebral infarction, unspecified: Secondary | ICD-10-CM | POA: Insufficient documentation

## 2013-10-16 DIAGNOSIS — N189 Chronic kidney disease, unspecified: Secondary | ICD-10-CM | POA: Insufficient documentation

## 2013-10-16 DIAGNOSIS — M1990 Unspecified osteoarthritis, unspecified site: Secondary | ICD-10-CM | POA: Insufficient documentation

## 2013-10-16 MED ORDER — TRIAMCINOLONE ACETONIDE 40 MG/ML IJ SUSP (RADIOLOGY)
60.0000 mg | Freq: Once | INTRAMUSCULAR | Status: AC
  Administered 2013-10-16: 60 mg via EPIDURAL

## 2013-10-16 MED ORDER — IOHEXOL 300 MG/ML  SOLN
1.0000 mL | Freq: Once | INTRAMUSCULAR | Status: AC | PRN
  Administered 2013-10-16: 1 mL via EPIDURAL

## 2013-10-22 ENCOUNTER — Encounter: Payer: Self-pay | Admitting: Sports Medicine

## 2013-10-22 ENCOUNTER — Ambulatory Visit (INDEPENDENT_AMBULATORY_CARE_PROVIDER_SITE_OTHER): Payer: Medicare Other | Admitting: Sports Medicine

## 2013-10-22 VITALS — BP 162/80 | HR 69 | Ht 66.0 in | Wt 141.0 lb

## 2013-10-22 DIAGNOSIS — M1712 Unilateral primary osteoarthritis, left knee: Secondary | ICD-10-CM

## 2013-10-22 DIAGNOSIS — M1710 Unilateral primary osteoarthritis, unspecified knee: Secondary | ICD-10-CM

## 2013-10-22 DIAGNOSIS — M5412 Radiculopathy, cervical region: Secondary | ICD-10-CM

## 2013-10-22 MED ORDER — HYDROCODONE-ACETAMINOPHEN 5-325 MG PO TABS: 1.0000 | ORAL_TABLET | Freq: Three times a day (TID) | ORAL | Status: DC

## 2013-10-22 NOTE — Assessment & Plan Note (Addendum)
Persistent pain despite most recent epidural. Starting pain management. Discontinue tramadol, starting hydrocodone 3 times a day.

## 2013-10-22 NOTE — Assessment & Plan Note (Signed)
Persistent pain after viscous supplementation. Reinjection. Heel lift in the right side, right leg is 2 cm shorter than the left.

## 2013-10-22 NOTE — Progress Notes (Signed)
  Subjective:    CC: Followup  HPI: Left knee osteoarthritis : persistent pain despite viscous supplementation.  Cervical spondylosis: Good response initially to epidurals, no response to facet injections, most recent epidural did not provide any relief.  Past medical history, Surgical history, Family history not pertinant except as noted below, Social history, Allergies, and medications have been entered into the medical record, reviewed, and no changes needed.   Review of Systems: No fevers, chills, night sweats, weight loss, chest pain, or shortness of breath.   Objective:    General: Well Developed, well nourished, and in no acute distress.  Neuro: Alert and oriented x3, extra-ocular muscles intact, sensation grossly intact.  HEENT: Normocephalic, atraumatic, pupils equal round reactive to light, neck supple, no masses, no lymphadenopathy, thyroid nonpalpable.  Skin: Warm and dry, no rashes. Cardiac: Regular rate and rhythm, no murmurs rubs or gallops, no lower extremity edema.  Respiratory: Clear to auscultation bilaterally. Not using accessory muscles, speaking in full sentences. Left Knee: Visit and palpable effusion with joint line tenderness. ROM normal in flexion and extension and lower leg rotation. Ligaments with solid consistent endpoints including ACL, PCL, LCL, MCL. Negative Mcmurray's and provocative meniscal tests. Non painful patellar compression. Patellar and quadriceps tendons unremarkable. Hamstring and quadriceps strength is normal.  Procedure: Real-time Ultrasound Guided aspiration/injection of left knee Device: GE Logiq E  Verbal informed consent obtained.  Time-out conducted.  Noted no overlying erythema, induration, or other signs of local infection.  Skin prepped in a sterile fashion.  Local anesthesia: Topical Ethyl chloride.  With sterile technique and under real time ultrasound guidance:  Aspirated 30 cc of straw-colored fluid through 18-gauge  needle, syringe switched and 2 cc kenalog 40, 4 cc lidocaine injected easily. Completed without difficulty  Pain immediately resolved suggesting accurate placement of the medication.  Advised to call if fevers/chills, erythema, induration, drainage, or persistent bleeding.  Images permanently stored and available for review in the ultrasound unit.  Impression: Technically successful ultrasound guided injection.  Impression and Recommendations:

## 2013-10-26 ENCOUNTER — Telehealth: Payer: Self-pay

## 2013-10-26 DIAGNOSIS — M5412 Radiculopathy, cervical region: Secondary | ICD-10-CM

## 2013-10-26 NOTE — Telephone Encounter (Signed)
Definitely eat all the candy he wants and break tabs in half!

## 2013-10-26 NOTE — Telephone Encounter (Signed)
Patient called stated that hydrocodone makes him sleep all day and makes him want to eat candy. He wants to know if he can be put on something else. Ricky Gallery,CMA

## 2013-10-27 NOTE — Telephone Encounter (Signed)
Left detailed message on patient vm with instructions as noted below. [name],CMA

## 2013-11-06 ENCOUNTER — Ambulatory Visit (INDEPENDENT_AMBULATORY_CARE_PROVIDER_SITE_OTHER): Payer: Medicare Other | Admitting: Sports Medicine

## 2013-11-06 ENCOUNTER — Encounter: Payer: Self-pay | Admitting: Sports Medicine

## 2013-11-06 VITALS — BP 125/66 | HR 56 | Wt 144.0 lb

## 2013-11-06 DIAGNOSIS — M5412 Radiculopathy, cervical region: Secondary | ICD-10-CM

## 2013-11-06 MED ORDER — CAPSAICIN-MENTHOL 0.025-1.25 % EX PTCH: 1.0000 | MEDICATED_PATCH | Freq: Three times a day (TID) | CUTANEOUS | Status: DC

## 2013-11-06 NOTE — Progress Notes (Signed)
  Subjective:    CC: Neck pain  HPI: Eric Buchanan is a very pleasant 80 year old male, he has multilevel cervical spondylosis, he has been initially responding well the cervical epidurals but unfortunately the most recent of which which was approximately 3-1/2 weeks ago, was ineffective. He continues with occasional hydrocodone and tramadol, and has started using Salonpas patches which are extremely effective. In fact, he tells me he has no pain today.  Past medical history, Surgical history, Family history not pertinant except as noted below, Social history, Allergies, and medications have been entered into the medical record, reviewed, and no changes needed.   Review of Systems: No fevers, chills, night sweats, weight loss, chest pain, or shortness of breath.   Objective:    General: Well Developed, well nourished, and in no acute distress.  Neuro: Alert and oriented x3, extra-ocular muscles intact, sensation grossly intact.  HEENT: Normocephalic, atraumatic, pupils equal round reactive to light, neck supple, no masses, no lymphadenopathy, thyroid nonpalpable.  Skin: Warm and dry, no rashes. Cardiac: Regular rate and rhythm, no murmurs rubs or gallops, no lower extremity edema.  Respiratory: Clear to auscultation bilaterally. Not using accessory muscles, speaking in full sentences.  Impression and Recommendations:

## 2013-11-06 NOTE — Assessment & Plan Note (Signed)
Overall doing well with an occasional hydrocodone, epidurals, and using salonpas patches. We are going to schedule his next epidural for 2 Mondays for now. Return as needed.

## 2013-11-16 ENCOUNTER — Ambulatory Visit
Admission: RE | Admit: 2013-11-16 | Discharge: 2013-11-16 | Disposition: A | Discharge status: Home / Self Care | Payer: Medicare Other | Source: Ambulatory Visit | Attending: Sports Medicine | Admitting: Sports Medicine

## 2013-11-16 MED ORDER — IOHEXOL 180 MG/ML  SOLN: 1.0000 mL | Freq: Once | INTRAMUSCULAR | Status: AC | PRN

## 2013-11-16 MED ORDER — METHYLPREDNISOLONE ACETATE 40 MG/ML INJ SUSP (RADIOLOG
120.0000 mg | Freq: Once | INTRAMUSCULAR | Status: DC
Start: 2013-11-16 — End: 2013-11-17

## 2013-11-19 ENCOUNTER — Ambulatory Visit (INDEPENDENT_AMBULATORY_CARE_PROVIDER_SITE_OTHER): Payer: Medicare Other | Admitting: Sports Medicine

## 2013-11-19 ENCOUNTER — Encounter: Payer: Self-pay | Admitting: Sports Medicine

## 2013-11-19 ENCOUNTER — Ambulatory Visit: Payer: Medicare Other | Admitting: Sports Medicine

## 2013-11-19 ENCOUNTER — Telehealth: Payer: Self-pay

## 2013-11-19 VITALS — BP 130/74 | HR 86 | Ht 65.0 in | Wt 144.0 lb

## 2013-11-19 DIAGNOSIS — T7840XA Allergy, unspecified, initial encounter: Secondary | ICD-10-CM

## 2013-11-19 DIAGNOSIS — M5412 Radiculopathy, cervical region: Secondary | ICD-10-CM

## 2013-11-19 DIAGNOSIS — J449 Chronic obstructive pulmonary disease, unspecified: Secondary | ICD-10-CM

## 2013-11-19 MED ORDER — OXYCODONE-ACETAMINOPHEN 5-325 MG PO TABS: 0.5000 | ORAL_TABLET | Freq: Three times a day (TID) | ORAL | Status: DC | PRN

## 2013-11-19 MED ORDER — PREDNISONE (PAK) 10 MG PO TABS: ORAL_TABLET | ORAL | Status: DC

## 2013-11-19 NOTE — Telephone Encounter (Signed)
The oxycodone will serve as cough medicine.

## 2013-11-19 NOTE — Assessment & Plan Note (Signed)
Skin rash after use of new prescription of hydrocodone. No mucocutaneous involvement. Switching to oxycodone.  Adding prednisone taper.

## 2013-11-19 NOTE — Progress Notes (Signed)
  Subjective:    CC: Skin rash  HPI: This is a pleasant 80 year old male with end-stage cervical spondylosis not responsive to multiple epidurals, he was on tramadol and we recently added hydrocodone for additional pain control. Unfortunately he developed a rash, pruritic, over his entire abdomen and back. No mucocutaneous involvement, or shortness of breath. No fevers or chills. No GI symptoms.  Past medical history, Surgical history, Family history not pertinant except as noted below, Social history, Allergies, and medications have been entered into the medical record, reviewed, and no changes needed.   Review of Systems: No fevers, chills, night sweats, weight loss, chest pain, or shortness of breath.   Objective:    General: Well Developed, well nourished, and in no acute distress.  Neuro: Alert and oriented x3, extra-ocular muscles intact, sensation grossly intact.  HEENT: Normocephalic, atraumatic, pupils equal round reactive to light, neck supple, no masses, no lymphadenopathy, thyroid nonpalpable. No mucocutaneous involvement. Skin: Warm and dry, there is a coalescing flat erythematous and blanching rash, maculopapular over the trunk. Cardiac: Regular rate and rhythm, no murmurs rubs or gallops, no lower extremity edema.  Respiratory: Clear to auscultation bilaterally. Not using accessory muscles, speaking in full sentences.  Impression and Recommendations:

## 2013-11-19 NOTE — Telephone Encounter (Signed)
Patient called stated that he was in the office today band was supposed to get a cough medicine called in to his pharmacy and when he got to the pharmacy it was not there. Patient is requesting a rx for cough medication. [name],CMA

## 2013-11-19 NOTE — Assessment & Plan Note (Signed)
Increasing cough. Adding prednisone taper.

## 2013-11-19 NOTE — Assessment & Plan Note (Signed)
Allergic reaction to hydrocodone. Switching to Percocet.

## 2013-11-23 NOTE — Telephone Encounter (Signed)
Patient has been informed with information below. [name],CMA

## 2013-12-01 ENCOUNTER — Other Ambulatory Visit: Payer: Self-pay

## 2013-12-01 MED ORDER — SIMVASTATIN 10 MG PO TABS: 10.0000 mg | ORAL_TABLET | Freq: Every day | ORAL | Status: DC

## 2013-12-01 NOTE — Telephone Encounter (Signed)
Patient called requested a refill for Simvastatin sent to [facility]. Damaria Stofko,CMA

## 2013-12-02 ENCOUNTER — Telehealth: Payer: Self-pay

## 2013-12-02 MED ORDER — ESOMEPRAZOLE MAGNESIUM 40 MG PO CPDR: 40.0000 mg | DELAYED_RELEASE_CAPSULE | Freq: Every day | ORAL | Status: DC

## 2013-12-02 NOTE — Telephone Encounter (Signed)
They are the same thing, I will however send it in as generic.

## 2013-12-02 NOTE — Telephone Encounter (Signed)
Patient called wants to know if he can take esomprazole magnesium in the place of nexium and if so can he get a Rx for that and if it cost more than the Nexium he will just switch back. Please advise patient uses [facility]

## 2013-12-03 NOTE — Telephone Encounter (Signed)
Patient has been informed. Eric Buchanan,CMA  

## 2013-12-07 ENCOUNTER — Telehealth: Payer: Self-pay

## 2013-12-07 DIAGNOSIS — M5412 Radiculopathy, cervical region: Secondary | ICD-10-CM

## 2013-12-07 MED ORDER — OXYCODONE-ACETAMINOPHEN 5-325 MG PO TABS: 0.5000 | ORAL_TABLET | Freq: Three times a day (TID) | ORAL | Status: DC | PRN

## 2013-12-07 NOTE — Telephone Encounter (Signed)
Patient request refill for Oxycodone. Eric Buchanan,CMA  

## 2013-12-18 ENCOUNTER — Encounter: Payer: Self-pay | Admitting: Sports Medicine

## 2013-12-18 ENCOUNTER — Ambulatory Visit (INDEPENDENT_AMBULATORY_CARE_PROVIDER_SITE_OTHER): Payer: Medicare Other | Admitting: Sports Medicine

## 2013-12-18 VITALS — BP 142/73 | HR 81 | Ht 64.5 in | Wt 148.0 lb

## 2013-12-18 DIAGNOSIS — J449 Chronic obstructive pulmonary disease, unspecified: Secondary | ICD-10-CM

## 2013-12-18 DIAGNOSIS — M5412 Radiculopathy, cervical region: Secondary | ICD-10-CM

## 2013-12-18 DIAGNOSIS — M1712 Unilateral primary osteoarthritis, left knee: Secondary | ICD-10-CM

## 2013-12-18 NOTE — Assessment & Plan Note (Signed)
In stage nonoperable cervical spondylosis. Has not responded to multiple cervical facet and epidural injections. Rash with hydrocodone, one half of an oxycodone twice a day has resulted in good pain control. We can do this long-term.

## 2013-12-18 NOTE — Progress Notes (Signed)
  Subjective:    CC: follow-up  HPI: Cervical spondylosis: End-stage, inoperable, and not responsive anymore to cervical epidural or facet injections. We initially tried hydrocodone which resulted in a rash, I then switched to oxycodone, he is using one half pill twice a day, he has a good response and is typically pain-free through the entire day on this regimen. Does not need any refills yet.  COPD: Currently seeing pulmonology, unfortunately is in the donut hole and having trouble affording his tiotropium, he was given several samples by his pulmonologist which should last him until next year.  Left knee osteoarthritis: Slightly swollen but patient is not having any pain.  Past medical history, Surgical history, Family history not pertinant except as noted below, Social history, Allergies, and medications have been entered into the medical record, reviewed, and no changes needed.   Review of Systems: No fevers, chills, night sweats, weight loss, chest pain, or shortness of breath.   Objective:    General: Well Developed, well nourished, and in no acute distress.  Neuro: Alert and oriented x3, extra-ocular muscles intact, sensation grossly intact.  HEENT: Normocephalic, atraumatic, pupils equal round reactive to light, neck supple, no masses, no lymphadenopathy, thyroid nonpalpable.  Skin: Warm and dry, no rashes. Cardiac: Regular rate and rhythm, no murmurs rubs or gallops, no lower extremity edema.  Respiratory: Clear to auscultation bilaterally. Not using accessory muscles, speaking in full sentences. Left Knee: Mild effusion with a fluid wave but no discrete areas of tenderness to palpation. ROM normal in flexion and extension and lower leg rotation. Ligaments with solid consistent endpoints including ACL, PCL, LCL, MCL. Negative Mcmurray's and provocative meniscal tests. Non painful patellar compression. Patellar and quadriceps tendons unremarkable. Hamstring and quadriceps  strength is normal.  Impression and Recommendations:

## 2013-12-18 NOTE — Assessment & Plan Note (Signed)
Slightly swollen,really not painful.

## 2013-12-18 NOTE — Assessment & Plan Note (Signed)
Currently in the donut hole. We will keep him going with samples until next year. Continue Advair and Spiriva.

## 2014-01-21 ENCOUNTER — Telehealth: Payer: Self-pay

## 2014-01-21 DIAGNOSIS — M5412 Radiculopathy, cervical region: Secondary | ICD-10-CM

## 2014-01-21 MED ORDER — OXYCODONE-ACETAMINOPHEN 5-325 MG PO TABS: 0.5000 | ORAL_TABLET | Freq: Three times a day (TID) | ORAL | Status: DC | PRN

## 2014-01-21 NOTE — Telephone Encounter (Signed)
Left a message on patient vm that Rx is ready to be picked up at the office. Michalene Debruler,CMA

## 2014-01-21 NOTE — Telephone Encounter (Signed)
Patient request refill for Oxycodone. Trent Gabler,CMA  

## 2014-01-21 NOTE — Telephone Encounter (Signed)
rx in box 

## 2014-01-22 ENCOUNTER — Other Ambulatory Visit: Payer: Self-pay | Admitting: *Deleted

## 2014-01-22 DIAGNOSIS — M5412 Radiculopathy, cervical region: Secondary | ICD-10-CM

## 2014-01-22 MED ORDER — OXYCODONE-ACETAMINOPHEN 5-325 MG PO TABS: 0.5000 | ORAL_TABLET | Freq: Three times a day (TID) | ORAL | Status: DC | PRN

## 2014-01-25 ENCOUNTER — Telehealth: Payer: Self-pay

## 2014-01-25 MED ORDER — FLUTICASONE PROPIONATE 50 MCG/ACT NA SUSP: NASAL | Status: DC

## 2014-01-25 NOTE — Telephone Encounter (Signed)
Patient request refill for Flonase 50 mcg a 30 day supply was sent to [facility]

## 2014-02-01 ENCOUNTER — Ambulatory Visit (INDEPENDENT_AMBULATORY_CARE_PROVIDER_SITE_OTHER): Payer: Medicare Other | Admitting: Sports Medicine

## 2014-02-01 ENCOUNTER — Encounter: Payer: Self-pay | Admitting: Sports Medicine

## 2014-02-01 ENCOUNTER — Ambulatory Visit (INDEPENDENT_AMBULATORY_CARE_PROVIDER_SITE_OTHER): Payer: Medicare Other

## 2014-02-01 DIAGNOSIS — J449 Chronic obstructive pulmonary disease, unspecified: Secondary | ICD-10-CM

## 2014-02-01 DIAGNOSIS — Z9889 Other specified postprocedural states: Secondary | ICD-10-CM

## 2014-02-01 DIAGNOSIS — L989 Disorder of the skin and subcutaneous tissue, unspecified: Secondary | ICD-10-CM

## 2014-02-01 DIAGNOSIS — R05 Cough: Secondary | ICD-10-CM

## 2014-02-01 MED ORDER — BENZONATATE 200 MG PO CAPS: 200.0000 mg | ORAL_CAPSULE | Freq: Three times a day (TID) | ORAL | Status: DC | PRN

## 2014-02-01 MED ORDER — TRIAMCINOLONE ACETONIDE 0.5 % EX OINT: 1.0000 "application " | TOPICAL_OINTMENT | Freq: Two times a day (BID) | CUTANEOUS | Status: DC

## 2014-02-01 MED ORDER — BUDESONIDE-FORMOTEROL FUMARATE 160-4.5 MCG/ACT IN AERO: 2.0000 | INHALATION_SPRAY | Freq: Two times a day (BID) | RESPIRATORY_TRACT | Status: DC

## 2014-02-01 NOTE — Assessment & Plan Note (Signed)
We are going to try topical triamcinolone, if no improvement I will excise these 2 skin lesions.

## 2014-02-01 NOTE — Progress Notes (Signed)
  Subjective:    CC: Coughing  HPI: COPD: Currently using Symbicort and Spiriva, for last 3 months he's had a low-grade cough only at bedtime, no shortness of breath. Would like a cough medication.  Skin lesions: Localized on the right arm, dorsal forearm where he tore his skin sometime ago. There are 2 pruritic nodules.  Past medical history, Surgical history, Family history not pertinant except as noted below, Social history, Allergies, and medications have been entered into the medical record, reviewed, and no changes needed.   Review of Systems: No fevers, chills, night sweats, weight loss, chest pain, or shortness of breath.   Objective:    General: Well Developed, well nourished, and in no acute distress.  Neuro: Alert and oriented x3, extra-ocular muscles intact, sensation grossly intact.  HEENT: Normocephalic, atraumatic, pupils equal round reactive to light, neck supple, no masses, no lymphadenopathy, thyroid nonpalpable.  Skin: Warm and dry, no rashes. There are two 1 cm nodules at the site of a prior scar on the right forearm. Cardiac: Regular rate and rhythm, no murmurs rubs or gallops, no lower extremity edema.  Respiratory: Clear to auscultation bilaterally. Not using accessory muscles, speaking in full sentences.  Impression and Recommendations:

## 2014-02-01 NOTE — Assessment & Plan Note (Signed)
Patient is aggressively treated for COPD with inhaled corticosteroids, long-acting beta agonist, and Spiriva. He is not in an exacerbation but does have a mild dry cough at bedtime. 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 I am going to add Tessalon Perles at bedtime, and get a chest x-ray.

## 2014-02-08 ENCOUNTER — Telehealth: Payer: Self-pay

## 2014-02-08 MED ORDER — ALLOPURINOL 100 MG PO TABS: 100.0000 mg | ORAL_TABLET | Freq: Every day | ORAL | Status: DC

## 2014-02-08 NOTE — Telephone Encounter (Signed)
Patient request refill for Allopurinol 100 mg sent to [facility]. [name],CMA

## 2014-02-18 ENCOUNTER — Telehealth: Payer: Self-pay

## 2014-02-18 DIAGNOSIS — M5412 Radiculopathy, cervical region: Secondary | ICD-10-CM

## 2014-02-18 MED ORDER — OXYCODONE-ACETAMINOPHEN 5-325 MG PO TABS: 0.5000 | ORAL_TABLET | Freq: Three times a day (TID) | ORAL | Status: DC | PRN

## 2014-02-18 NOTE — Telephone Encounter (Signed)
Patient has been informed that Rx is ready for picked up he stated that he will pick it up tomorrow. Earl Losee,CMA

## 2014-02-18 NOTE — Telephone Encounter (Signed)
Patient request refill for Oxycodone. Patient request a phone call when ready to be picked up. Eric Buchanan,CMA

## 2014-02-18 NOTE — Telephone Encounter (Signed)
Prescription printed

## 2014-03-01 ENCOUNTER — Ambulatory Visit (INDEPENDENT_AMBULATORY_CARE_PROVIDER_SITE_OTHER): Payer: Medicare Other | Admitting: Sports Medicine

## 2014-03-01 ENCOUNTER — Other Ambulatory Visit: Payer: Self-pay | Admitting: Sports Medicine

## 2014-03-01 ENCOUNTER — Encounter: Payer: Self-pay | Admitting: Sports Medicine

## 2014-03-01 ENCOUNTER — Ambulatory Visit: Payer: Medicare Other | Admitting: Sports Medicine

## 2014-03-01 VITALS — BP 128/69 | HR 87 | Temp 98.1°F | Wt 146.0 lb

## 2014-03-01 DIAGNOSIS — S51812A Laceration without foreign body of left forearm, initial encounter: Secondary | ICD-10-CM | POA: Insufficient documentation

## 2014-03-01 DIAGNOSIS — J449 Chronic obstructive pulmonary disease, unspecified: Secondary | ICD-10-CM

## 2014-03-01 DIAGNOSIS — L989 Disorder of the skin and subcutaneous tissue, unspecified: Secondary | ICD-10-CM

## 2014-03-01 DIAGNOSIS — M1712 Unilateral primary osteoarthritis, left knee: Secondary | ICD-10-CM

## 2014-03-01 MED ORDER — DEXTROMETHORPHAN POLISTIREX 30 MG/5ML PO LQCR: 60.0000 mg | Freq: Two times a day (BID) | ORAL | Status: DC

## 2014-03-01 MED ORDER — CEPHALEXIN 500 MG PO CAPS: 500.0000 mg | ORAL_CAPSULE | Freq: Two times a day (BID) | ORAL | Status: DC

## 2014-03-01 NOTE — Assessment & Plan Note (Signed)
Persistent cough, adding dextromethorphan. Continue Symbicort and Spiriva, he has already had steroids and antibiotics. Return in a month, if persistent symptoms we will likely need to either change his COPD regimen, or have an ENT doctor visualize his larynx.

## 2014-03-01 NOTE — Assessment & Plan Note (Addendum)
This appears to be a 10 cm laceration with a degloving injury over the dorsal and volar forearm. There does not appear to be any injury to the tendons, nerves, venous or arterial structures. Primary repair performed with approximately 10 sutures. Return to see me on Friday. Tetanus booster today. Keflex.

## 2014-03-01 NOTE — Progress Notes (Signed)
  Subjective:    CC: Follow-up  HPI: Forearm skin lesion: Occurred at the site of prior trauma, suspected to be a granuloma, improving significantly with topical triamcinolone.  Left knee arthritis: Increasing effusion and swelling, desires aspiration and injection.  Coughing: History of COPD, compliant with current inhalers, persistent cough.  Past medical history, Surgical history, Family history not pertinant except as noted below, Social history, Allergies, and medications have been entered into the medical record, reviewed, and no changes needed.   Review of Systems: No fevers, chills, night sweats, weight loss, chest pain, or shortness of breath.   Objective:    General: Well Developed, well nourished, and in no acute distress.  Neuro: Alert and oriented x3, extra-ocular muscles intact, sensation grossly intact.  HEENT: Normocephalic, atraumatic, pupils equal round reactive to light, neck supple, no masses, no lymphadenopathy, thyroid nonpalpable.  Skin: Warm and dry, no rashes. Of the 2 nodules that were present on the right forearm, one has disappeared, and the second one is significantly smaller. Cardiac: Regular rate and rhythm, no murmurs rubs or gallops, no lower extremity edema.  Respiratory: Clear to auscultation bilaterally. Not using accessory muscles, speaking in full sentences. Left Knee: Visible and palpable effusion with a fluid wave. Palpation normal with no warmth or joint line tenderness or patellar tenderness or condyle tenderness. ROM normal in flexion and extension and lower leg rotation. Ligaments with solid consistent endpoints including ACL, PCL, LCL, MCL. Negative Mcmurray's and provocative meniscal tests. Non painful patellar compression. Patellar and quadriceps tendons unremarkable. Hamstring and quadriceps strength is normal.  Procedure: Real-time Ultrasound Guided aspiration/Injection of left knee Device: GE Logiq E  Verbal informed consent  obtained.  Time-out conducted.  Noted no overlying erythema, induration, or other signs of local infection.  Skin prepped in a sterile fashion.  Local anesthesia: Topical Ethyl chloride.  With sterile technique and under real time ultrasound guidance:  45 mL of straw-colored fluid aspirated syringe switched and 1cc kenalog 40, 4 cc lidocaine injected. Completed without difficulty Pain immediately resolved suggesting accurate placement of the medication.  Advised to call if fevers/chills, erythema, induration, drainage, or persistent bleeding.  Images permanently stored and available for review in the ultrasound unit.  Impression: Technically successful ultrasound guided injection.  Impression and Recommendations:

## 2014-03-01 NOTE — Patient Instructions (Signed)

## 2014-03-01 NOTE — Assessment & Plan Note (Signed)
Initially there were 2, these occurred at a site of trauma suggesting granuloma. They are improving significantly with topical triamcinolone. Again, if they continue to be persistent we will excise and sent to pathology.

## 2014-03-01 NOTE — Assessment & Plan Note (Signed)
Aspiration and injection as above. 

## 2014-03-01 NOTE — Progress Notes (Addendum)
  Subjective:    CC:  Fall with laceration  HPI:  I saw Nicoli earlier today, I aspirated and injected his knee, when he got home unfortunately he fell , impacting his left forearm, unfortunately he didn't with a 10 cm laceration all the way around his dorsal to volar forearm with significant bleeding, he came to urgent care but there weight time was 3 hours and so he presented here seeing if we could help him. Pain is only minimal, but he does have significant bleeding.  Past medical history, Surgical history, Family history not pertinant except as noted below, Social history, Allergies, and medications have been entered into the medical record, reviewed, and no changes needed.   Review of Systems: No fevers, chills, night sweats, weight loss, chest pain, or shortness of breath.   Objective:    General: Well Developed, well nourished, and in no acute distress.  Neuro: Alert and oriented x3, extra-ocular muscles intact, sensation grossly intact.  HEENT: Normocephalic, atraumatic, pupils equal round reactive to light, neck supple, no masses, no lymphadenopathy, thyroid nonpalpable.  Skin: Warm and dry, no rashes. Cardiac: Regular rate and rhythm, no murmurs rubs or gallops, no lower extremity edema.  Respiratory: Clear to auscultation bilaterally. Not using accessory muscles, speaking in full sentences. Left forearm: There is what appears to be a 10 cm laceration extending around his mid forearm with some degloving of the skin. He is for the most part hemostat.   Neurovascularly intact distally. Clinically this does not appear to be any injury to the dorsal or the volar tendons.  Laceration repair: 10 cm wound, left arm I irrigated the wound with approximately one-week liter of sterile saline and peroxide, subsequently I used approximately 10 mL of lidocaine with epinephrine to anesthetize the wound. I then draped the wound in a sterile fashion, and placed 9 simple interrupted sutures. I had  great difficulty approximating the edges of the skin, as it was very thin. The forearm was then strapped with compressive dressing. Routine postprocedure instructions d/w pt- keep area clean and bandaged, follow up if concerns/spreading erythema/pain.   I was clear with the patient that I was not certain as to whether the skin would survive considering it was degloved through the subcutaneous tissues. Follow up for for suture removal.  Impression and Recommendations:

## 2014-03-05 ENCOUNTER — Encounter: Payer: Self-pay | Admitting: Sports Medicine

## 2014-03-05 ENCOUNTER — Ambulatory Visit (INDEPENDENT_AMBULATORY_CARE_PROVIDER_SITE_OTHER): Payer: Medicare Other | Admitting: Sports Medicine

## 2014-03-05 VITALS — BP 136/62 | HR 79 | Ht 65.0 in | Wt 145.0 lb

## 2014-03-05 DIAGNOSIS — S51812D Laceration without foreign body of left forearm, subsequent encounter: Secondary | ICD-10-CM

## 2014-03-05 NOTE — Progress Notes (Signed)
  Subjective:  4 days post extensive laceration of the left forearm with #9 simple interrupted sutures. I did have some concern as to whether the overlying skin would survive due to extensive degloving injury, it looks pretty good today.   Objective: General: Well-developed, well-nourished, and in no acute distress. Left forearm: Skin edges are approximated well, no erythema, induration, or drainage, dressing is clean, dry, intact. All sutures are still in place.  The wound was dressed with Xeroform , a nonadherent dressing, and then strapped with Caban.  Assessment/plan:

## 2014-03-05 NOTE — Assessment & Plan Note (Signed)
Doing very well.  return next Thursday for suture removal.  Continue daily dressing changes.

## 2014-03-11 ENCOUNTER — Encounter: Payer: Self-pay | Admitting: Sports Medicine

## 2014-03-11 ENCOUNTER — Ambulatory Visit (INDEPENDENT_AMBULATORY_CARE_PROVIDER_SITE_OTHER): Payer: Medicare Other | Admitting: Sports Medicine

## 2014-03-11 ENCOUNTER — Telehealth: Payer: Self-pay

## 2014-03-11 ENCOUNTER — Telehealth: Payer: Self-pay | Admitting: Sports Medicine

## 2014-03-11 VITALS — BP 134/65 | HR 85 | Wt 146.0 lb

## 2014-03-11 DIAGNOSIS — I350 Nonrheumatic aortic (valve) stenosis: Secondary | ICD-10-CM

## 2014-03-11 DIAGNOSIS — S51812D Laceration without foreign body of left forearm, subsequent encounter: Secondary | ICD-10-CM

## 2014-03-11 NOTE — Progress Notes (Signed)
  Subjective:    CC: Follow-up  HPI: Left forearm laceration: Looking good, sutures will be removed today.  Poor exercise tolerance: History of aortic stenosis, he does have a fairly severe heart murmur, he also has COPD but has never needed oxygen, has very few exacerbations. No chest pain on exertion, simply poor tolerance with breathlessness afterwards.  Past medical history, Surgical history, Family history not pertinant except as noted below, Social history, Allergies, and medications have been entered into the medical record, reviewed, and no changes needed.   Review of Systems: No fevers, chills, night sweats, weight loss, chest pain, or shortness of breath.   Objective:    General: Well Developed, well nourished, and in no acute distress.  Neuro: Alert and oriented x3, extra-ocular muscles intact, sensation grossly intact.  HEENT: Normocephalic, atraumatic, pupils equal round reactive to light, neck supple, no masses, no lymphadenopathy, thyroid nonpalpable.  Skin: Warm and dry, no rashes. Cardiac: Regular rate and rhythm, 3/6 systolic ejection murmur, no lower extremity edema.  Respiratory: Clear to auscultation bilaterally. Not using accessory muscles, speaking in full sentences. Left forearm: Laceration appears well-healed, I removed 9 simple interrupted sutures, no sign of infection.  Ambulatory pulse ox remained at 95-96% with ambulation.  Impression and Recommendations:

## 2014-03-11 NOTE — Assessment & Plan Note (Signed)
Persistently decreased exercise tolerance, I do suspect this is related to his aortic stenosis. Ambulatory pulse ox was okay, I would like him to touch base with cardiology to see if we have any more options for improving his exercise tolerance without surgery, several years ago his wife went through the same surgery and perished. We will go ahead and obtain an echocardiogram in the meantime his last one was in 2013, at [facility] [facility], the results are not available to me.

## 2014-03-11 NOTE — Telephone Encounter (Signed)
Called patient to let him know his echo has been scheduled for 03-17-14 at 1:45 at [facility] and he is aware

## 2014-03-11 NOTE — Telephone Encounter (Signed)
Patient called stated wanted to let PCP know that his cardiologist is Dr. Tamala Julian [address] [place] [phone]. [name],CMA

## 2014-03-11 NOTE — Assessment & Plan Note (Signed)
I couldn't be more pleased with how the laceration has done, all 9 simple interrupted sutures were removed today, a nonadherent dressing was placed and it was strapped with compressive dressing. Return as needed for this.

## 2014-03-11 NOTE — Telephone Encounter (Signed)
Routing to [place], we will switch the cardiology referral placed today from [facility] cardiology downstairs to Dr. Tamala Julian as below.

## 2014-03-11 NOTE — Telephone Encounter (Signed)
PLEASE SEE NOTE BELOW Eric Buchanan. [name],CMA

## 2014-03-17 ENCOUNTER — Ambulatory Visit (HOSPITAL_BASED_OUTPATIENT_CLINIC_OR_DEPARTMENT_OTHER)
Admission: RE | Admit: 2014-03-17 | Discharge: 2014-03-17 | Disposition: A | Discharge status: Home / Self Care | Payer: Medicare Other | Source: Ambulatory Visit | Attending: Sports Medicine | Admitting: Sports Medicine

## 2014-03-17 DIAGNOSIS — I350 Nonrheumatic aortic (valve) stenosis: Secondary | ICD-10-CM

## 2014-03-17 NOTE — Progress Notes (Signed)
  Echocardiogram 2D Echocardiogram has been performed.  Donata Clay 03/17/2014, 2:57 PM

## 2014-03-24 ENCOUNTER — Telehealth: Payer: Self-pay

## 2014-03-24 DIAGNOSIS — M5412 Radiculopathy, cervical region: Secondary | ICD-10-CM

## 2014-03-24 MED ORDER — OXYCODONE-ACETAMINOPHEN 5-325 MG PO TABS: 0.5000 | ORAL_TABLET | Freq: Three times a day (TID) | ORAL | Status: DC | PRN

## 2014-03-24 NOTE — Telephone Encounter (Signed)
Patient request refill for Oxycodone. Danissa Rundle,CMA  

## 2014-03-24 NOTE — Telephone Encounter (Signed)
Patient has been informed that Rx is ready for pickup. Gurleen Larrivee,CMA  

## 2014-03-24 NOTE — Telephone Encounter (Signed)
rx in box 

## 2014-03-29 ENCOUNTER — Ambulatory Visit: Payer: Medicare Other | Admitting: Sports Medicine

## 2014-04-14 ENCOUNTER — Telehealth: Payer: Self-pay

## 2014-04-14 MED ORDER — LISINOPRIL-HYDROCHLOROTHIAZIDE 20-25 MG PO TABS: 0.5000 | ORAL_TABLET | Freq: Every day | ORAL | Status: DC

## 2014-04-14 NOTE — Telephone Encounter (Signed)
Patient request refill for Lisinopril #90 3R was sent to [facility]. Cuahutemoc Attar,CMA

## 2014-04-22 ENCOUNTER — Ambulatory Visit (INDEPENDENT_AMBULATORY_CARE_PROVIDER_SITE_OTHER): Payer: Medicare Other | Admitting: Sports Medicine

## 2014-04-22 ENCOUNTER — Ambulatory Visit: Payer: Medicare Other | Admitting: Sports Medicine

## 2014-04-22 ENCOUNTER — Other Ambulatory Visit: Payer: Self-pay | Admitting: Sports Medicine

## 2014-04-22 ENCOUNTER — Encounter: Payer: Self-pay | Admitting: Sports Medicine

## 2014-04-22 VITALS — BP 138/64 | HR 76 | Ht 65.0 in | Wt 147.0 lb

## 2014-04-22 DIAGNOSIS — I350 Nonrheumatic aortic (valve) stenosis: Secondary | ICD-10-CM | POA: Diagnosis not present

## 2014-04-22 DIAGNOSIS — M5412 Radiculopathy, cervical region: Secondary | ICD-10-CM

## 2014-04-22 DIAGNOSIS — M1712 Unilateral primary osteoarthritis, left knee: Secondary | ICD-10-CM

## 2014-04-22 DIAGNOSIS — S51812S Laceration without foreign body of left forearm, sequela: Secondary | ICD-10-CM | POA: Diagnosis not present

## 2014-04-22 DIAGNOSIS — J410 Simple chronic bronchitis: Secondary | ICD-10-CM | POA: Diagnosis not present

## 2014-04-22 MED ORDER — OXYCODONE-ACETAMINOPHEN 5-325 MG PO TABS: 0.5000 | ORAL_TABLET | Freq: Three times a day (TID) | ORAL | Status: DC | PRN

## 2014-04-22 MED ORDER — DOXYCYCLINE HYCLATE 100 MG PO TABS: 100.0000 mg | ORAL_TABLET | Freq: Two times a day (BID) | ORAL | Status: DC

## 2014-04-22 NOTE — Assessment & Plan Note (Signed)
Aspiration and low-dose injection as above.

## 2014-04-22 NOTE — Assessment & Plan Note (Signed)
Continue Symbicort, Spiriva, and occasional cough drops.

## 2014-04-22 NOTE — Assessment & Plan Note (Signed)
Again, persistently decreased exercise tolerance, and mild presyncope. Recent echocardiogram does confirm suspected severe aortic stenosis, he does also have moderate to severe mitral regurgitation. Ambulatory pulse ox was okay the previous visit. He does have an appointment coming up with cardiology to see if we have any other ways to improve his exercise tolerance without any interventions. He is euvolemic, and not in pulmonary edema. Unfortunately his wife went through aortic valve replacement surgery sometime back and perished.

## 2014-04-22 NOTE — Assessment & Plan Note (Signed)
Surprisingly healed well, there does appear to be a granuloma at part of the laceration repair, there is some purulent so we are going to add doxycycline for 7 days. He has had a granuloma at a previous laceration site, which had to be surgically excised by me. Return to see me in one week to reevaluate the laceration granuloma, and for excision if not better.

## 2014-04-22 NOTE — Addendum Note (Signed)
Addended by: Doree Albee on: 04/22/2014 02:50 PM   Modules accepted: Orders

## 2014-04-22 NOTE — Progress Notes (Signed)
  Subjective:    CC: Follow-up  HPI: Left knee osteoarthritis: Desires repeat aspiration and injection.  Valvular heart disease: Recent echocardiogram did show severe aortic stenosis and moderate to severe mitral regurgitation. He has not yet seen his cardiologist.  COPD: Well controlled with Symbicort, Spiriva, and occasional cough drop.  Forearm laceration: Post repair, looks really good considering degree of laceration, he has developed a nodule at the site of the healing laceration, with some whitish drainage. He does have a history of a granuloma that I had to excise after a mild elbow trauma in the recent past.  Past medical history, Surgical history, Family history not pertinant except as noted below, Social history, Allergies, and medications have been entered into the medical record, reviewed, and no changes needed.   Review of Systems: No fevers, chills, night sweats, weight loss, chest pain, or shortness of breath.   Objective:    General: Well Developed, well nourished, and in no acute distress.  Neuro: Alert and oriented x3, extra-ocular muscles intact, sensation grossly intact.  HEENT: Normocephalic, atraumatic, pupils equal round reactive to light, neck supple, no masses, no lymphadenopathy, thyroid nonpalpable.  Skin: Warm and dry, no rashes. Laceration repair looks good, there is a 2 cm nodule with some whitish drainage. Nontender, no erythema. Cardiac: Regular rate and rhythm, no murmurs rubs or gallops, no lower extremity edema.  Respiratory: Clear to auscultation bilaterally. Not using accessory muscles, speaking in full sentences.  Procedure: Real-time Ultrasound Guided aspiration/Injection of left knee Device: GE Logiq E  Verbal informed consent obtained.  Time-out conducted.  Noted no overlying erythema, induration, or other signs of local infection.  Skin prepped in a sterile fashion.  Local anesthesia: Topical Ethyl chloride.  With sterile technique and under  real time ultrasound guidance:  20 mL straw-colored fluid aspirated, syringe switched and 1 mL kenalog 40, 4 mL lidocaine injected easily. Completed without difficulty  Pain immediately resolved suggesting accurate placement of the medication.  Advised to call if fevers/chills, erythema, induration, drainage, or persistent bleeding.  Images permanently stored and available for review in the ultrasound unit.  Impression: Technically successful ultrasound guided injection.  Impression and Recommendations:

## 2014-04-26 NOTE — Progress Notes (Signed)
HPI: 81 year old male for evaluation of aortic stenosis. Abdominal ultrasound October 2013 showed no aneurysm. Echocardiogram February 2016 showed normal LV function, grade 1 diastolic dysfunction, severe aortic stenosis with a mean gradient of 40 mmHg. There was moderate to severe mitral regurgitation. Moderate left atrial enlargement and mild tricuspid regurgitation. Patient states that for the past 1 year he has had increased fatigue and dyspnea on exertion. No orthopnea, PND but occasional pedal edema. He has vague chest pain. The pain is in the right chest area and occurs both with exertion and at rest. Last 15 minutes and resolves spontaneously. No associated symptoms. Not pleuritic, positional or exertional. Described as an "inconvenience".   Current Outpatient Prescriptions  Medication Sig Dispense Refill  . albuterol (PROVENTIL HFA;VENTOLIN HFA) 108 (90 BASE) MCG/ACT inhaler Inhale into the lungs.    Marland Kitchen allopurinol (ZYLOPRIM) 100 MG tablet Take 1 tablet (100 mg total) by mouth daily. 90 tablet 3  . aspirin 81 MG tablet Take 81 mg by mouth.    . budesonide-formoterol (SYMBICORT) 160-4.5 MCG/ACT inhaler Inhale 2 puffs into the lungs 2 (two) times daily. 1 Inhaler 0  . Capsaicin-Menthol (SALONPAS GEL) 0.025-1.25 % PTCH Apply 1 patch topically 3 (three) times daily.    . Cholecalciferol (VITAMIN D) 2000 UNITS tablet Take 2,000 Units by mouth.    . dextromethorphan (DELSYM) 30 MG/5ML liquid Take 10 mLs (60 mg total) by mouth 2 (two) times daily. 148 mL 3  . doxycycline (VIBRA-TABS) 100 MG tablet TAKE 1 TABLET (100 MG TOTAL) BY MOUTH 2 (TWO) TIMES DAILY. 14 tablet 0  . esomeprazole (NEXIUM) 40 MG capsule Take 1 capsule (40 mg total) by mouth daily at 12 noon. 90 capsule 3  . fluticasone (FLONASE) 50 MCG/ACT nasal spray One spray in each nostril twice a day, use left hand for right nostril, and right hand for left nostril. 48 g 0  . furosemide (LASIX) 20 MG tablet Take 20 mg by mouth.    .  Glucosamine Sulfate-MSM 500-400 MG CAPS Take by mouth 3 (three) times daily.    Marland Kitchen lisinopril-hydrochlorothiazide (PRINZIDE,ZESTORETIC) 20-25 MG per tablet Take 0.5 tablets by mouth daily. 90 tablet 3  . Naproxen Sodium 220 MG CAPS Take 2 tablets by mouth.    . olmesartan (BENICAR) 20 MG tablet Take 20 mg by mouth.    . oxyCODONE-acetaminophen (PERCOCET/ROXICET) 5-325 MG per tablet Take 0.5 tablets by mouth every 8 (eight) hours as needed. 45 tablet 0  . simvastatin (ZOCOR) 10 MG tablet Take 1 tablet (10 mg total) by mouth at bedtime. 90 tablet 3  . Tiotropium Bromide Monohydrate (SPIRIVA RESPIMAT) 2.5 MCG/ACT AERS Inhale 2.5 mg into the lungs every morning.    . triamcinolone ointment (KENALOG) 0.5 % Apply 1 application topically 2 (two) times daily. To affected area, avoid eyes and face 30 g 3   No current facility-administered medications for this visit.    Allergies  Allergen Reactions  . Beta Adrenergic Blockers     Too low  . Hydrocodone Rash     Past Medical History  Diagnosis Date  . Hypertension   . Asthma   . Gout   . Hyperlipidemia   . Stroke   . Chronic kidney disease   . Arthritis   . Aortic stenosis   . CAD (coronary artery disease)     Past Surgical History  Procedure Laterality Date  . Coronary artery bypass graft      History   Social History  .  Marital Status: Widowed    Spouse Name: N/A  . Number of Children: 3  . Years of Education: N/A   Occupational History  . Not on file.   Social History Main Topics  . Smoking status: Former Smoker    Quit date: 02/06/1963  . Smokeless tobacco: Not on file  . Alcohol Use: 0.0 oz/week    0 Standard drinks or equivalent per week     Comment: One beer per day  . Drug Use: No  . Sexual Activity: Not Currently   Other Topics Concern  . Not on file   Social History Narrative    Family History  Problem Relation Age of Onset  . Heart attack Mother   . Heart attack Brother     ROS: neck pain and  knee pain but no fevers or chills, productive cough, hemoptysis, dysphasia, odynophagia, melena, hematochezia, dysuria, hematuria, rash, seizure activity, orthopnea, PND, pedal edema, claudication. Remaining systems are negative.  Physical Exam:   Blood pressure 112/68, pulse 81, height 5\' 5"  (1.651 m), weight 144 lb (65.318 kg).  General:  Well developed/well nourished in NAD Skin warm/dry Patient not depressed No peripheral clubbing Back-normal HEENT-normal/normal eyelids Neck supple/normal carotid upstroke bilaterally; no bruits; no JVD; no thyromegaly chest - CTA/ normal expansion, previous sternotomy CV - RRR/normal S1 and S2; no rubs or gallops;  PMI nondisplaced, 3/6 systolic murmur left sternal border. S2 is diminished. 2/6 systolic murmur apex. Abdomen -NT/ND, no HSM, no mass, + bowel sounds, no bruit 1+ femoral pulses, no bruits Ext-trace edema, chords, diminished distal pulses Neuro-grossly nonfocal  ECG sinus rhythm with first-degree AV block, left ventricular hypertrophy, left anterior fascicular block, occasional PACs.

## 2014-04-28 ENCOUNTER — Ambulatory Visit (INDEPENDENT_AMBULATORY_CARE_PROVIDER_SITE_OTHER): Payer: Medicare Other | Admitting: Cardiology

## 2014-04-28 ENCOUNTER — Encounter: Payer: Self-pay | Admitting: *Deleted

## 2014-04-28 ENCOUNTER — Other Ambulatory Visit: Payer: Self-pay | Admitting: Cardiology

## 2014-04-28 ENCOUNTER — Encounter: Payer: Self-pay | Admitting: Cardiology

## 2014-04-28 DIAGNOSIS — I10 Essential (primary) hypertension: Secondary | ICD-10-CM | POA: Diagnosis not present

## 2014-04-28 DIAGNOSIS — I350 Nonrheumatic aortic (valve) stenosis: Secondary | ICD-10-CM

## 2014-04-28 DIAGNOSIS — I2510 Atherosclerotic heart disease of native coronary artery without angina pectoris: Secondary | ICD-10-CM | POA: Diagnosis not present

## 2014-04-28 DIAGNOSIS — Z951 Presence of aortocoronary bypass graft: Secondary | ICD-10-CM

## 2014-04-28 DIAGNOSIS — R079 Chest pain, unspecified: Secondary | ICD-10-CM | POA: Insufficient documentation

## 2014-04-28 MED ORDER — ATORVASTATIN CALCIUM 40 MG PO TABS: 40.0000 mg | ORAL_TABLET | Freq: Every day | ORAL | Status: DC

## 2014-04-28 NOTE — Assessment & Plan Note (Signed)
Patient has severe aortic stenosis and moderate to severe mitral regurgitation noted on his echocardiogram. He appears to be symptomatic with increased dyspnea on exertion for one year. Given his age and previous sternotomy doubt he would be a candidate for open aortic valve replacement/mitral valve replacement. However he may be a candidate for TAVR. Hopefully relieving his aortic stenosis would improve his mitral regurgitation. Patient is also having vague atypical chest pain. I will plan to arrange a right and left catheterization with Dr. Burt Knack. The risks and benefits were discussed and the patient agrees to proceed. Dr. Burt Knack can then decide whether he feels patient is a candidate for TAVR and whether to pursue additional preoperative evaluation. Patient would most likely require transesophageal echocardiogram to evaluate mechanism of mitral regurgitation as well. Hold Lasix for 2 days prior to and the day of his procedure. Hold lisinopril HCT the day of his procedure as well.

## 2014-04-28 NOTE — Assessment & Plan Note (Signed)
Symptoms are atypical. Question related to aortic stenosis. Plan right and left cardiac catheterization as described above.

## 2014-04-28 NOTE — Assessment & Plan Note (Signed)
Continue aspirin and statin. 

## 2014-04-28 NOTE — Assessment & Plan Note (Signed)
DC Zocor.begin Lipitor 40 mg daily. Check lipids and liver in 4 weeks.

## 2014-04-28 NOTE — Assessment & Plan Note (Signed)
Continue present blood pressure medications. 

## 2014-04-28 NOTE — Patient Instructions (Addendum)
Your physician recommends that you schedule a follow-up appointment in: [place]  Your physician recommends that you HAVE LAB WORK IN ONE WEEK  Your physician has requested that you have a cardiac catheterization. Cardiac catheterization is used to diagnose and/or treat various heart conditions. Doctors may recommend this procedure for a number of different reasons. The most common reason is to evaluate chest pain. Chest pain can be a symptom of coronary artery disease (CAD), and cardiac catheterization can show whether plaque is narrowing or blocking your heart's arteries. This procedure is also used to evaluate the valves, as well as measure the blood flow and oxygen levels in different parts of your heart. For further information please visit [email or web address]. Please follow instruction sheet, as given.   STOP SIMVASTATIN  START ATORVASTATIN 40 MG ONCE DAILY  Your physician recommends that you return for lab work in: 4 WEEKS-DO NOT EAT PRIOR TO LAB WORK

## 2014-04-29 ENCOUNTER — Ambulatory Visit (INDEPENDENT_AMBULATORY_CARE_PROVIDER_SITE_OTHER): Payer: Medicare Other | Admitting: Sports Medicine

## 2014-04-29 ENCOUNTER — Encounter: Payer: Self-pay | Admitting: Sports Medicine

## 2014-04-29 ENCOUNTER — Ambulatory Visit: Payer: PRIVATE HEALTH INSURANCE | Admitting: Sports Medicine

## 2014-04-29 VITALS — BP 103/60 | HR 72 | Ht 65.0 in | Wt 144.0 lb

## 2014-04-29 DIAGNOSIS — Z23 Encounter for immunization: Secondary | ICD-10-CM | POA: Diagnosis not present

## 2014-04-29 DIAGNOSIS — I2510 Atherosclerotic heart disease of native coronary artery without angina pectoris: Secondary | ICD-10-CM | POA: Diagnosis not present

## 2014-04-29 DIAGNOSIS — S51812D Laceration without foreign body of left forearm, subsequent encounter: Secondary | ICD-10-CM

## 2014-04-29 DIAGNOSIS — Z951 Presence of aortocoronary bypass graft: Secondary | ICD-10-CM

## 2014-04-29 DIAGNOSIS — I350 Nonrheumatic aortic (valve) stenosis: Secondary | ICD-10-CM | POA: Diagnosis not present

## 2014-04-29 DIAGNOSIS — E785 Hyperlipidemia, unspecified: Secondary | ICD-10-CM

## 2014-04-29 MED ORDER — ATORVASTATIN CALCIUM 40 MG PO TABS: 40.0000 mg | ORAL_TABLET | Freq: Every day | ORAL | Status: DC

## 2014-04-29 NOTE — Assessment & Plan Note (Signed)
Small granuloma no longer appears infected after doxycycline. Return as needed for this.

## 2014-04-29 NOTE — Assessment & Plan Note (Signed)
Switching to atorvastatin per cardiology request

## 2014-04-29 NOTE — Progress Notes (Signed)
  Subjective:    CC:  Follow-up  HPI:  aortic stenosis: Sounds like a percutaneous procedure is being entertained.   Hyperlipidemia: Switched to Lipitor, I am okay with this transition.   Laceration granuloma: purulence has resolved with doxycycline.  Past medical history, Surgical history, Family history not pertinant except as noted below, Social history, Allergies, and medications have been entered into the medical record, reviewed, and no changes needed.   Review of Systems: No fevers, chills, night sweats, weight loss, chest pain, or shortness of breath.   Objective:    General: Well Developed, well nourished, and in no acute distress.  Neuro: Alert and oriented x3, extra-ocular muscles intact, sensation grossly intact.  HEENT: Normocephalic, atraumatic, pupils equal round reactive to light, neck supple, no masses, no lymphadenopathy, thyroid nonpalpable.  Skin: Warm and dry, no rashes. Granuloma is no longer purulent, and appears much smaller. Cardiac: Regular rate and rhythm,  2/6 systolic ejection murmur in the aortic area, no lower extremity edema.  Respiratory: Clear to auscultation bilaterally. Not using accessory muscles, speaking in full sentences.  Impression and Recommendations:

## 2014-04-29 NOTE — Assessment & Plan Note (Signed)
Eric Buchanan is having severe/critical aortic stenosis, this is been discussed with cardiology and it sounds like he is a candidate for a percutaneous valvulotomy. He is going to undergo a catheterization and likely a transesophageal echocardiogram first. He does have some exertional chest pain, presyncope, and fatigue.

## 2014-05-06 ENCOUNTER — Telehealth: Payer: Self-pay | Admitting: Cardiology

## 2014-05-06 NOTE — Telephone Encounter (Signed)
New Message..  Pr requests a call back to discuss time to arrive for [facility] on 05/10/2014. Gave patient the time from letter sent from nurse. Closing encounter

## 2014-05-07 ENCOUNTER — Telehealth: Payer: Self-pay | Admitting: Cardiology

## 2014-05-07 LAB — CBC
HEMATOCRIT: 38.9 % — AB (ref 39.0–52.0)
HEMOGLOBIN: 13 g/dL (ref 13.0–17.0)
MCH: 30.1 pg (ref 26.0–34.0)
MCHC: 33.4 g/dL (ref 30.0–36.0)
MCV: 90 fL (ref 78.0–100.0)
MPV: 10.4 fL (ref 8.6–12.4)
Platelets: 293 10*3/uL (ref 150–400)
RBC: 4.32 MIL/uL (ref 4.22–5.81)
RDW: 14.7 % (ref 11.5–15.5)
WBC: 6.2 10*3/uL (ref 4.0–10.5)

## 2014-05-07 NOTE — Telephone Encounter (Signed)
Spoke with pt, up dates to medication list made.

## 2014-05-08 LAB — BASIC METABOLIC PANEL WITH GFR
BUN: 27 mg/dL — ABNORMAL HIGH (ref 6–23)
CHLORIDE: 96 meq/L (ref 96–112)
CO2: 26 meq/L (ref 19–32)
CREATININE: 1.41 mg/dL — AB (ref 0.50–1.35)
Calcium: 9.5 mg/dL (ref 8.4–10.5)
GFR, EST NON AFRICAN AMERICAN: 45 mL/min — AB
GFR, Est African American: 52 mL/min — ABNORMAL LOW
Glucose, Bld: 95 mg/dL (ref 70–99)
Potassium: 4.6 mEq/L (ref 3.5–5.3)
Sodium: 132 mEq/L — ABNORMAL LOW (ref 135–145)

## 2014-05-08 LAB — HEPATIC FUNCTION PANEL
ALBUMIN: 4.2 g/dL (ref 3.5–5.2)
ALT: 18 U/L (ref 0–53)
AST: 21 U/L (ref 0–37)
Alkaline Phosphatase: 99 U/L (ref 39–117)
Bilirubin, Direct: 0.2 mg/dL (ref 0.0–0.3)
Indirect Bilirubin: 0.6 mg/dL (ref 0.2–1.2)
Total Bilirubin: 0.8 mg/dL (ref 0.2–1.2)
Total Protein: 6.4 g/dL (ref 6.0–8.3)

## 2014-05-08 LAB — PROTIME-INR
INR: 0.98 (ref <1.50)
Prothrombin Time: 13 seconds (ref 11.6–15.2)

## 2014-05-08 LAB — LIPID PANEL
CHOLESTEROL: 128 mg/dL (ref 0–200)
HDL: 60 mg/dL (ref >=40)
LDL CALC: 52 mg/dL (ref 0–99)
Total CHOL/HDL Ratio: 2.1 Ratio
Triglycerides: 80 mg/dL (ref <150)
VLDL: 16 mg/dL (ref 0–40)

## 2014-05-10 ENCOUNTER — Encounter (HOSPITAL_COMMUNITY)
Admission: RE | Disposition: A | Discharge status: Home / Self Care | Payer: Self-pay | Source: Ambulatory Visit | Attending: Cardiovascular Disease

## 2014-05-10 ENCOUNTER — Encounter (HOSPITAL_COMMUNITY): Payer: Self-pay | Admitting: Cardiovascular Disease

## 2014-05-10 ENCOUNTER — Ambulatory Visit (HOSPITAL_COMMUNITY)
Admission: RE | Admit: 2014-05-10 | Discharge: 2014-05-10 | Disposition: A | Discharge status: Home / Self Care | Payer: Medicare Other | Source: Ambulatory Visit | Attending: Cardiovascular Disease | Admitting: Cardiovascular Disease

## 2014-05-10 ENCOUNTER — Encounter: Payer: Self-pay | Admitting: Cardiology

## 2014-05-10 DIAGNOSIS — Z8673 Personal history of transient ischemic attack (TIA), and cerebral infarction without residual deficits: Secondary | ICD-10-CM | POA: Insufficient documentation

## 2014-05-10 DIAGNOSIS — I340 Nonrheumatic mitral (valve) insufficiency: Secondary | ICD-10-CM | POA: Insufficient documentation

## 2014-05-10 DIAGNOSIS — M1990 Unspecified osteoarthritis, unspecified site: Secondary | ICD-10-CM | POA: Diagnosis not present

## 2014-05-10 DIAGNOSIS — I350 Nonrheumatic aortic (valve) stenosis: Secondary | ICD-10-CM | POA: Diagnosis not present

## 2014-05-10 DIAGNOSIS — I129 Hypertensive chronic kidney disease with stage 1 through stage 4 chronic kidney disease, or unspecified chronic kidney disease: Secondary | ICD-10-CM | POA: Insufficient documentation

## 2014-05-10 DIAGNOSIS — N189 Chronic kidney disease, unspecified: Secondary | ICD-10-CM | POA: Diagnosis not present

## 2014-05-10 DIAGNOSIS — Z87891 Personal history of nicotine dependence: Secondary | ICD-10-CM | POA: Insufficient documentation

## 2014-05-10 DIAGNOSIS — I2510 Atherosclerotic heart disease of native coronary artery without angina pectoris: Secondary | ICD-10-CM | POA: Insufficient documentation

## 2014-05-10 DIAGNOSIS — Z951 Presence of aortocoronary bypass graft: Secondary | ICD-10-CM | POA: Insufficient documentation

## 2014-05-10 DIAGNOSIS — J45909 Unspecified asthma, uncomplicated: Secondary | ICD-10-CM | POA: Insufficient documentation

## 2014-05-10 DIAGNOSIS — I25810 Atherosclerosis of coronary artery bypass graft(s) without angina pectoris: Secondary | ICD-10-CM

## 2014-05-10 DIAGNOSIS — M109 Gout, unspecified: Secondary | ICD-10-CM | POA: Diagnosis not present

## 2014-05-10 DIAGNOSIS — E785 Hyperlipidemia, unspecified: Secondary | ICD-10-CM | POA: Insufficient documentation

## 2014-05-10 DIAGNOSIS — R0600 Dyspnea, unspecified: Secondary | ICD-10-CM | POA: Diagnosis present

## 2014-05-10 HISTORY — PX: LEFT AND RIGHT HEART CATHETERIZATION WITH CORONARY/GRAFT ANGIOGRAM: SHX5448

## 2014-05-10 HISTORY — DX: Atherosclerosis of coronary artery bypass graft(s) without angina pectoris: I25.810

## 2014-05-10 LAB — POCT I-STAT 3, ART BLOOD GAS (G3+)
ACID-BASE DEFICIT: 3 mmol/L — AB (ref 0.0–2.0)
Acid-base deficit: 4 mmol/L — ABNORMAL HIGH (ref 0.0–2.0)
Bicarbonate: 20 mEq/L (ref 20.0–24.0)
Bicarbonate: 21.8 mEq/L (ref 20.0–24.0)
O2 SAT: 62 %
O2 Saturation: 94 %
PCO2 ART: 37.1 mmHg (ref 35.0–45.0)
PH ART: 7.423 (ref 7.350–7.450)
PO2 ART: 70 mmHg — AB (ref 80.0–100.0)
TCO2: 21 mmol/L (ref 0–100)
TCO2: 23 mmol/L (ref 0–100)
pCO2 arterial: 30.6 mmHg — ABNORMAL LOW (ref 35.0–45.0)
pH, Arterial: 7.378 (ref 7.350–7.450)
pO2, Arterial: 32 mmHg — CL (ref 80.0–100.0)

## 2014-05-10 SURGERY — LEFT AND RIGHT HEART CATHETERIZATION WITH CORONARY/GRAFT ANGIOGRAM
Anesthesia: LOCAL

## 2014-05-10 MED ORDER — HEPARIN (PORCINE) IN NACL 2-0.9 UNIT/ML-% IJ SOLN
INTRAMUSCULAR | Status: AC
  Filled 2014-05-10: qty 1000

## 2014-05-10 MED ORDER — ASPIRIN 81 MG PO CHEW
CHEWABLE_TABLET | ORAL | Status: AC
  Administered 2014-05-10: 81 mg via ORAL
  Filled 2014-05-10: qty 1

## 2014-05-10 MED ORDER — SODIUM CHLORIDE 0.9 % IV SOLN
1.0000 mL/kg/h | INTRAVENOUS | Status: DC
  Administered 2014-05-10: 1 mL/kg/h via INTRAVENOUS

## 2014-05-10 MED ORDER — SODIUM CHLORIDE 0.9 % IJ SOLN: 3.0000 mL | INTRAMUSCULAR | Status: DC | PRN

## 2014-05-10 MED ORDER — LIDOCAINE HCL (PF) 1 % IJ SOLN
INTRAMUSCULAR | Status: AC
  Filled 2014-05-10: qty 30

## 2014-05-10 MED ORDER — FENTANYL CITRATE 0.05 MG/ML IJ SOLN
INTRAMUSCULAR | Status: AC
  Filled 2014-05-10: qty 2

## 2014-05-10 MED ORDER — MIDAZOLAM HCL 2 MG/2ML IJ SOLN
INTRAMUSCULAR | Status: AC
  Filled 2014-05-10: qty 2

## 2014-05-10 MED ORDER — SODIUM CHLORIDE 0.9 % IJ SOLN: 3.0000 mL | Freq: Two times a day (BID) | INTRAMUSCULAR | Status: DC

## 2014-05-10 MED ORDER — NITROGLYCERIN 1 MG/10 ML FOR IR/CATH LAB
INTRA_ARTERIAL | Status: AC
  Filled 2014-05-10: qty 10

## 2014-05-10 MED ORDER — ASPIRIN 81 MG PO CHEW
81.0000 mg | CHEWABLE_TABLET | ORAL | Status: AC
  Administered 2014-05-10: 81 mg via ORAL

## 2014-05-10 MED ORDER — SODIUM CHLORIDE 0.9 % IV SOLN: 1.0000 mL/kg/h | INTRAVENOUS | Status: DC

## 2014-05-10 MED ORDER — SODIUM CHLORIDE 0.9 % IV SOLN: 250.0000 mL | INTRAVENOUS | Status: DC | PRN

## 2014-05-10 NOTE — Discharge Instructions (Signed)

## 2014-05-10 NOTE — Interval H&P Note (Signed)
History and Physical Interval Note:  05/10/2014 1:46 PM  Eric Buchanan  has presented today for surgery, with the diagnosis of as/cp  The various methods of treatment have been discussed with the patient and family. After consideration of risks, benefits and other options for treatment, the patient has consented to  Procedure(s): LEFT AND RIGHT HEART CATHETERIZATION WITH CORONARY/GRAFT ANGIOGRAM (N/A) as a surgical intervention .  The patient's history has been reviewed, patient examined, no change in status, stable for surgery.  I have reviewed the patient's chart and labs.  Questions were answered to the patient's satisfaction.     Sherren Mocha

## 2014-05-10 NOTE — Progress Notes (Signed)
Site area: RFA Site Prior to Removal:  Level  Pressure Applied For:97min Manual:   yes Patient Status During Pull:  stable Post Pull Site:  Level0 Post Pull Instructions Given:  yes Post Pull Pulses Present:palpable  Dressing Applied:  clear Bedrest begins @ [id number] Comments  Removed by Judge Stall:

## 2014-05-10 NOTE — H&P (View-Only) (Signed)
HPI: 81 year old male for evaluation of aortic stenosis. Abdominal ultrasound October 2013 showed no aneurysm. Echocardiogram February 2016 showed normal LV function, grade 1 diastolic dysfunction, severe aortic stenosis with a mean gradient of 40 mmHg. There was moderate to severe mitral regurgitation. Moderate left atrial enlargement and mild tricuspid regurgitation. Patient states that for the past 1 year he has had increased fatigue and dyspnea on exertion. No orthopnea, PND but occasional pedal edema. He has vague chest pain. The pain is in the right chest area and occurs both with exertion and at rest. Last 15 minutes and resolves spontaneously. No associated symptoms. Not pleuritic, positional or exertional. Described as an "inconvenience".   Current Outpatient Prescriptions  Medication Sig Dispense Refill  . albuterol (PROVENTIL HFA;VENTOLIN HFA) 108 (90 BASE) MCG/ACT inhaler Inhale into the lungs.    Marland Kitchen allopurinol (ZYLOPRIM) 100 MG tablet Take 1 tablet (100 mg total) by mouth daily. 90 tablet 3  . aspirin 81 MG tablet Take 81 mg by mouth.    . budesonide-formoterol (SYMBICORT) 160-4.5 MCG/ACT inhaler Inhale 2 puffs into the lungs 2 (two) times daily. 1 Inhaler 0  . Capsaicin-Menthol (SALONPAS GEL) 0.025-1.25 % PTCH Apply 1 patch topically 3 (three) times daily.    . Cholecalciferol (VITAMIN D) 2000 UNITS tablet Take 2,000 Units by mouth.    . dextromethorphan (DELSYM) 30 MG/5ML liquid Take 10 mLs (60 mg total) by mouth 2 (two) times daily. 148 mL 3  . doxycycline (VIBRA-TABS) 100 MG tablet TAKE 1 TABLET (100 MG TOTAL) BY MOUTH 2 (TWO) TIMES DAILY. 14 tablet 0  . esomeprazole (NEXIUM) 40 MG capsule Take 1 capsule (40 mg total) by mouth daily at 12 noon. 90 capsule 3  . fluticasone (FLONASE) 50 MCG/ACT nasal spray One spray in each nostril twice a day, use left hand for right nostril, and right hand for left nostril. 48 g 0  . furosemide (LASIX) 20 MG tablet Take 20 mg by mouth.    .  Glucosamine Sulfate-MSM 500-400 MG CAPS Take by mouth 3 (three) times daily.    Marland Kitchen lisinopril-hydrochlorothiazide (PRINZIDE,ZESTORETIC) 20-25 MG per tablet Take 0.5 tablets by mouth daily. 90 tablet 3  . Naproxen Sodium 220 MG CAPS Take 2 tablets by mouth.    . olmesartan (BENICAR) 20 MG tablet Take 20 mg by mouth.    . oxyCODONE-acetaminophen (PERCOCET/ROXICET) 5-325 MG per tablet Take 0.5 tablets by mouth every 8 (eight) hours as needed. 45 tablet 0  . simvastatin (ZOCOR) 10 MG tablet Take 1 tablet (10 mg total) by mouth at bedtime. 90 tablet 3  . Tiotropium Bromide Monohydrate (SPIRIVA RESPIMAT) 2.5 MCG/ACT AERS Inhale 2.5 mg into the lungs every morning.    . triamcinolone ointment (KENALOG) 0.5 % Apply 1 application topically 2 (two) times daily. To affected area, avoid eyes and face 30 g 3   No current facility-administered medications for this visit.    Allergies  Allergen Reactions  . Beta Adrenergic Blockers     Too low  . Hydrocodone Rash     Past Medical History  Diagnosis Date  . Hypertension   . Asthma   . Gout   . Hyperlipidemia   . Stroke   . Chronic kidney disease   . Arthritis   . Aortic stenosis   . CAD (coronary artery disease)     Past Surgical History  Procedure Laterality Date  . Coronary artery bypass graft      History   Social History  .  Marital Status: Widowed    Spouse Name: N/A  . Number of Children: 3  . Years of Education: N/A   Occupational History  . Not on file.   Social History Main Topics  . Smoking status: Former Smoker    Quit date: 02/06/1963  . Smokeless tobacco: Not on file  . Alcohol Use: 0.0 oz/week    0 Standard drinks or equivalent per week     Comment: One beer per day  . Drug Use: No  . Sexual Activity: Not Currently   Other Topics Concern  . Not on file   Social History Narrative    Family History  Problem Relation Age of Onset  . Heart attack Mother   . Heart attack Brother     ROS: neck pain and  knee pain but no fevers or chills, productive cough, hemoptysis, dysphasia, odynophagia, melena, hematochezia, dysuria, hematuria, rash, seizure activity, orthopnea, PND, pedal edema, claudication. Remaining systems are negative.  Physical Exam:   Blood pressure 112/68, pulse 81, height 5\' 5"  (1.651 m), weight 144 lb (65.318 kg).  General:  Well developed/well nourished in NAD Skin warm/dry Patient not depressed No peripheral clubbing Back-normal HEENT-normal/normal eyelids Neck supple/normal carotid upstroke bilaterally; no bruits; no JVD; no thyromegaly chest - CTA/ normal expansion, previous sternotomy CV - RRR/normal S1 and S2; no rubs or gallops;  PMI nondisplaced, 3/6 systolic murmur left sternal border. S2 is diminished. 2/6 systolic murmur apex. Abdomen -NT/ND, no HSM, no mass, + bowel sounds, no bruit 1+ femoral pulses, no bruits Ext-trace edema, chords, diminished distal pulses Neuro-grossly nonfocal  ECG sinus rhythm with first-degree AV block, left ventricular hypertrophy, left anterior fascicular block, occasional PACs.

## 2014-05-10 NOTE — CV Procedure (Signed)
Cardiac Catheterization Procedure Note  Name: Eric Buchanan MRN: [id number] DOB: [birth year]  Procedure: Right Heart Cath, Left Heart Cath, Selective Coronary Angiography, SVG angiography, LIMA angiography  Indication: Aortic stenosis and mitral regurgitation. 81 year old gentleman with history of remote CABG in 1999. He has developed progressive exertional dyspnea over the last year, now with New York Heart Association functional class III symptoms. Echocardiography has demonstrated findings consistent with severe aortic stenosis and moderately severe mitral regurgitation. He has preserved LV systolic function. He is referred for cardiac catheterization to further evaluate treatment options for his coronary and valvular heart disease.   Procedural Details: The right groin was prepped, draped, and anesthetized with 1% lidocaine. Using the modified Seldinger technique a 5 French sheath was placed in the right femoral artery and a 7 French sheath was placed in the right femoral vein. A Swan-Ganz catheter was used for the right heart catheterization. Standard protocol was followed for recording of right heart pressures and sampling of oxygen saturations. Fick cardiac output was calculated. Standard Judkins catheters were used for selective coronary angiography, saphenous vein graft angiography, and LIMA angiography. The JR4 catheter was used to cross the aortic valve and record left ventricular pressure. The same catheter was used for saphenous vein graft angiography. A LIMA catheter was used for LIMA angiography. Ventriculography was deferred because of chronic kidney disease.. There were no immediate procedural complications. The patient was transferred to the post catheterization recovery area for further monitoring.  Procedural Findings: Hemodynamics RA 8 RV 50/9 PA 45/21 with a mean of 35 PCWP A wave 19, V-wave 19, mean of 21 LV 169/13 AO 147/74 with a mean of 104  Oxygen saturations: PA  62 AO 94  Cardiac Output (Fick) 4.0  Cardiac Index (Fick) 2.35  Aortic valve hemodynamics: Mean gradient 25 mmHg, aortic valve area 0.8 cm   Coronary angiography: Coronary dominance: right  Left mainstem: Heavy calcification with 95% distal left main stenosis  Left anterior descending (LAD): The LAD is severely calcified. The vessel is totally occluded proximally.  Left circumflex (LCx): The left circumflex is severely diseased. There is 95% ostial stenosis. The obtuse marginal branches are occluded. The distal posterolateral branches are small, but patent. There is diffuse 80-90% mid left circumflex stenosis.  Right coronary artery (RCA): The RCA is dominant. The vessel is diffusely calcified. The mid RCA has 80% stenosis. The PLA branch is patent. The PDA branch has area severe diffuse disease with multiple sequential 90-95% stenoses. The vessel is small beyond the area of severe stenosis.  Saphenous vein graft to OM 2: The graft is patent. The mid body of the graft as eccentric 75% stenosis. Beyond the graft insertion site, the native OM 2 is severely diseased with 95% stenosis into a small subbranch. The first OM is also small and it fills retrograde from the graft insertion site.  LIMA to LAD: The LIMA is a large conduit. The vessel is widely patent. The anastomotic site is intact. The LAD is patent and there is 80% stenosis in the apical portion of the LAD. The diagonal and septal perforator branches all fill from the graft.  Left ventriculography: Deferred because of chronic kidney disease  Estimated Blood Loss: minimal  Final Conclusions:   1. Severe three-vessel coronary artery disease 2. Status post aortocoronary bypass surgery with continued patency of the LIMA to LAD and saphenous vein graft to OM 2 3. Severe aortic stenosis 4. Known moderate to severe mitral regurgitation based on echo findings 5. Moderate  point hypertension, likely related to left heart  disease  Recommendations: The patient has diffuse distal vessel CAD. He has at least moderate stenosis of the saphenous vein graft to OM 2. He does not have very good revascularization options because of the diffuse nature of his CAD and distal vessel location. The patient denies symptoms of angina and primarily is limited by shortness of breath. He has severe valvular disease involving both the aortic and mitral valves. I reviewed his echo images and agree that he has severe aortic stenosis and probably severe mitral regurgitation. I think it TEE would be helpful to better delineate his treatment options. If he has purely functional mitral regurgitation, this may improve significantly with treatment of his aortic stenosis. After his TEE, would recommend formal cardiac surgical evaluation for further consideration of treatment options.  Sherren Mocha 05/10/2014, 2:40 PM

## 2014-05-12 ENCOUNTER — Telehealth: Payer: Self-pay | Admitting: *Deleted

## 2014-05-12 DIAGNOSIS — I359 Nonrheumatic aortic valve disorder, unspecified: Secondary | ICD-10-CM

## 2014-05-12 NOTE — Telephone Encounter (Signed)
Returning your call. °

## 2014-05-12 NOTE — Telephone Encounter (Signed)
Left message for pt to call.

## 2014-05-12 NOTE — Telephone Encounter (Signed)
Hilda Blades,     Can you schedule TEE with me and then consult with Dr Roxy Manns following TEE.    [name]        ----- Message -----     From: Sherren Mocha, MD     Sent: 05/10/2014  2:53 PM      To: Laury Deep, RN, Lelon Perla, MD        Aaron Edelman - tough case. He has significant CAD, but no easy treatment options. I think a TEE would be really helpful and would follow that with a consult from [facility] or [place]. I'll copy Levonne Spiller, our TAVR/TCTS navigator so she can stay in the loop on him.         thx Ronalee Belts    Left message for pt to call

## 2014-05-13 ENCOUNTER — Other Ambulatory Visit: Payer: Self-pay | Admitting: Cardiology

## 2014-05-13 ENCOUNTER — Encounter: Payer: Self-pay | Admitting: *Deleted

## 2014-05-13 DIAGNOSIS — I340 Nonrheumatic mitral (valve) insufficiency: Secondary | ICD-10-CM

## 2014-05-13 NOTE — Telephone Encounter (Signed)
Spoke with pt, TEE scheduled for 05-17-14 @ 9 am. Patient voiced understanding of location and time of procedure. Referral placed to dr Roxy Manns

## 2014-05-13 NOTE — Telephone Encounter (Signed)
Pt is returning your call

## 2014-05-17 ENCOUNTER — Other Ambulatory Visit: Payer: Self-pay | Admitting: *Deleted

## 2014-05-17 ENCOUNTER — Encounter (HOSPITAL_COMMUNITY)
Admission: RE | Disposition: A | Discharge status: Home / Self Care | Payer: Self-pay | Source: Ambulatory Visit | Attending: Cardiology

## 2014-05-17 ENCOUNTER — Encounter (HOSPITAL_COMMUNITY): Payer: Self-pay | Admitting: *Deleted

## 2014-05-17 ENCOUNTER — Ambulatory Visit (HOSPITAL_COMMUNITY)
Admission: RE | Admit: 2014-05-17 | Discharge: 2014-05-17 | Disposition: A | Discharge status: Home / Self Care | Payer: Medicare Other | Source: Ambulatory Visit | Attending: Cardiology | Admitting: Cardiology

## 2014-05-17 DIAGNOSIS — Z87891 Personal history of nicotine dependence: Secondary | ICD-10-CM | POA: Insufficient documentation

## 2014-05-17 DIAGNOSIS — Z79899 Other long term (current) drug therapy: Secondary | ICD-10-CM | POA: Diagnosis not present

## 2014-05-17 DIAGNOSIS — Z8673 Personal history of transient ischemic attack (TIA), and cerebral infarction without residual deficits: Secondary | ICD-10-CM | POA: Insufficient documentation

## 2014-05-17 DIAGNOSIS — M109 Gout, unspecified: Secondary | ICD-10-CM | POA: Insufficient documentation

## 2014-05-17 DIAGNOSIS — J45909 Unspecified asthma, uncomplicated: Secondary | ICD-10-CM | POA: Diagnosis not present

## 2014-05-17 DIAGNOSIS — I2510 Atherosclerotic heart disease of native coronary artery without angina pectoris: Secondary | ICD-10-CM | POA: Insufficient documentation

## 2014-05-17 DIAGNOSIS — E785 Hyperlipidemia, unspecified: Secondary | ICD-10-CM | POA: Diagnosis not present

## 2014-05-17 DIAGNOSIS — Z7982 Long term (current) use of aspirin: Secondary | ICD-10-CM | POA: Diagnosis not present

## 2014-05-17 DIAGNOSIS — Z5309 Procedure and treatment not carried out because of other contraindication: Secondary | ICD-10-CM | POA: Insufficient documentation

## 2014-05-17 DIAGNOSIS — I340 Nonrheumatic mitral (valve) insufficiency: Secondary | ICD-10-CM

## 2014-05-17 DIAGNOSIS — I350 Nonrheumatic aortic (valve) stenosis: Secondary | ICD-10-CM | POA: Insufficient documentation

## 2014-05-17 DIAGNOSIS — N189 Chronic kidney disease, unspecified: Secondary | ICD-10-CM | POA: Diagnosis not present

## 2014-05-17 DIAGNOSIS — K222 Esophageal obstruction: Secondary | ICD-10-CM

## 2014-05-17 DIAGNOSIS — I129 Hypertensive chronic kidney disease with stage 1 through stage 4 chronic kidney disease, or unspecified chronic kidney disease: Secondary | ICD-10-CM | POA: Diagnosis not present

## 2014-05-17 HISTORY — PX: TEE WITHOUT CARDIOVERSION: SHX5443

## 2014-05-17 SURGERY — ECHOCARDIOGRAM, TRANSESOPHAGEAL
Anesthesia: Moderate Sedation

## 2014-05-17 MED ORDER — DIPHENHYDRAMINE HCL 50 MG/ML IJ SOLN
INTRAMUSCULAR | Status: AC
  Filled 2014-05-17: qty 1

## 2014-05-17 MED ORDER — BUTAMBEN-TETRACAINE-BENZOCAINE 2-2-14 % EX AERO
INHALATION_SPRAY | CUTANEOUS | Status: DC | PRN
  Administered 2014-05-17: 2 via TOPICAL

## 2014-05-17 MED ORDER — MIDAZOLAM HCL 5 MG/ML IJ SOLN
INTRAMUSCULAR | Status: AC
  Filled 2014-05-17: qty 2

## 2014-05-17 MED ORDER — MIDAZOLAM HCL 10 MG/2ML IJ SOLN
INTRAMUSCULAR | Status: DC | PRN
  Administered 2014-05-17: 1 mg via INTRAVENOUS
  Administered 2014-05-17 (×2): 2 mg via INTRAVENOUS
  Administered 2014-05-17: 1 mg via INTRAVENOUS

## 2014-05-17 MED ORDER — FENTANYL CITRATE 0.05 MG/ML IJ SOLN
INTRAMUSCULAR | Status: DC | PRN
  Administered 2014-05-17: 12.5 ug via INTRAVENOUS
  Administered 2014-05-17: 25 ug via INTRAVENOUS
  Administered 2014-05-17: 12.5 ug via INTRAVENOUS

## 2014-05-17 MED ORDER — SODIUM CHLORIDE 0.9 % IV SOLN
INTRAVENOUS | Status: DC
  Administered 2014-05-17: 09:00:00 via INTRAVENOUS

## 2014-05-17 MED ORDER — FENTANYL CITRATE 0.05 MG/ML IJ SOLN
INTRAMUSCULAR | Status: AC
  Filled 2014-05-17: qty 2

## 2014-05-17 NOTE — H&P (Signed)
[place]  04/28/2014 2:15 PM  Office Visit  MRN:  [id number]   Description: Male DOB: 02/19/1925  Provider: Lelon Perla, MD  Department: [facility]       Vital Signs  Most recent update: 04/28/2014 2:11 PM by Garret Reddish, CMA    BP Pulse Ht Wt BMI    112/68 mmHg 81 5\' 5"  (1.651 m) 144 lb (65.318 kg) 23.96 kg/m2    Vitals History     Progress Notes      Lelon Perla, MD at 04/26/2014 1:30 PM     Status: Signed       Expand All Collapse All       HPI: 81 year old male for evaluation of aortic stenosis. Abdominal ultrasound October 2013 showed no aneurysm. Echocardiogram February 2016 showed normal LV function, grade 1 diastolic dysfunction, severe aortic stenosis with a mean gradient of 40 mmHg. There was moderate to severe mitral regurgitation. Moderate left atrial enlargement and mild tricuspid regurgitation. Patient states that for the past 1 year he has had increased fatigue and dyspnea on exertion. No orthopnea, PND but occasional pedal edema. He has vague chest pain. The pain is in the right chest area and occurs both with exertion and at rest. Last 15 minutes and resolves spontaneously. No associated symptoms. Not pleuritic, positional or exertional. Described as an "inconvenience".   Current Outpatient Prescriptions  Medication Sig Dispense Refill  . albuterol (PROVENTIL HFA;VENTOLIN HFA) 108 (90 BASE) MCG/ACT inhaler Inhale into the lungs.    Marland Kitchen allopurinol (ZYLOPRIM) 100 MG tablet Take 1 tablet (100 mg total) by mouth daily. 90 tablet 3  . aspirin 81 MG tablet Take 81 mg by mouth.    . budesonide-formoterol (SYMBICORT) 160-4.5 MCG/ACT inhaler Inhale 2 puffs into the lungs 2 (two) times daily. 1 Inhaler 0  . Capsaicin-Menthol (SALONPAS GEL) 0.025-1.25 % PTCH Apply 1 patch topically 3 (three) times daily.    . Cholecalciferol (VITAMIN D) 2000 UNITS tablet Take 2,000 Units by mouth.    . dextromethorphan (DELSYM) 30  MG/5ML liquid Take 10 mLs (60 mg total) by mouth 2 (two) times daily. 148 mL 3  . doxycycline (VIBRA-TABS) 100 MG tablet TAKE 1 TABLET (100 MG TOTAL) BY MOUTH 2 (TWO) TIMES DAILY. 14 tablet 0  . esomeprazole (NEXIUM) 40 MG capsule Take 1 capsule (40 mg total) by mouth daily at 12 noon. 90 capsule 3  . fluticasone (FLONASE) 50 MCG/ACT nasal spray One spray in each nostril twice a day, use left hand for right nostril, and right hand for left nostril. 48 g 0  . furosemide (LASIX) 20 MG tablet Take 20 mg by mouth.    . Glucosamine Sulfate-MSM 500-400 MG CAPS Take by mouth 3 (three) times daily.    Marland Kitchen lisinopril-hydrochlorothiazide (PRINZIDE,ZESTORETIC) 20-25 MG per tablet Take 0.5 tablets by mouth daily. 90 tablet 3  . Naproxen Sodium 220 MG CAPS Take 2 tablets by mouth.    . olmesartan (BENICAR) 20 MG tablet Take 20 mg by mouth.    . oxyCODONE-acetaminophen (PERCOCET/ROXICET) 5-325 MG per tablet Take 0.5 tablets by mouth every 8 (eight) hours as needed. 45 tablet 0  . simvastatin (ZOCOR) 10 MG tablet Take 1 tablet (10 mg total) by mouth at bedtime. 90 tablet 3  . Tiotropium Bromide Monohydrate (SPIRIVA RESPIMAT) 2.5 MCG/ACT AERS Inhale 2.5 mg into the lungs every morning.    . triamcinolone ointment (KENALOG) 0.5 % Apply 1 application topically 2 (two) times daily. To affected area, avoid eyes and face  30 g 3   No current facility-administered medications for this visit.    Allergies  Allergen Reactions  . Beta Adrenergic Blockers     Too low  . Hydrocodone Rash     Past Medical History  Diagnosis Date  . Hypertension   . Asthma   . Gout   . Hyperlipidemia   . Stroke   . Chronic kidney disease   . Arthritis   . Aortic stenosis   . CAD (coronary artery disease)     Past Surgical History  Procedure Laterality Date  . Coronary artery bypass graft      History    Social History  . Marital Status: Widowed    Spouse Name: N/A  . Number of Children: 3  . Years of Education: N/A   Occupational History  . Not on file.   Social History Main Topics  . Smoking status: Former Smoker    Quit date: 02/06/1963  . Smokeless tobacco: Not on file  . Alcohol Use: 0.0 oz/week    0 Standard drinks or equivalent per week     Comment: One beer per day  . Drug Use: No  . Sexual Activity: Not Currently   Other Topics Concern  . Not on file   Social History Narrative    Family History  Problem Relation Age of Onset  . Heart attack Mother   . Heart attack Brother     ROS: neck pain and knee pain but no fevers or chills, productive cough, hemoptysis, dysphasia, odynophagia, melena, hematochezia, dysuria, hematuria, rash, seizure activity, orthopnea, PND, pedal edema, claudication. Remaining systems are negative.  Physical Exam:   Blood pressure 112/68, pulse 81, height 5\' 5"  (1.651 m), weight 144 lb (65.318 kg).  General: Well developed/well nourished in NAD Skin warm/dry Patient not depressed No peripheral clubbing Back-normal HEENT-normal/normal eyelids Neck supple/normal carotid upstroke bilaterally; no bruits; no JVD; no thyromegaly chest - CTA/ normal expansion, previous sternotomy CV - RRR/normal S1 and S2; no rubs or gallops; PMI nondisplaced, 3/6 systolic murmur left sternal border. S2 is diminished. 2/6 systolic murmur apex. Abdomen -NT/ND, no HSM, no mass, + bowel sounds, no bruit 1+ femoral pulses, no bruits Ext-trace edema, chords, diminished distal pulses Neuro-grossly nonfocal  ECG sinus rhythm with first-degree AV block, left ventricular hypertrophy, left anterior fascicular block, occasional PACs.              Aortic stenosis - Lelon Perla, MD at 04/28/2014 2:49 PM     Status: Written Related Problem: Aortic stenosis   Expand All  Collapse All   Patient has severe aortic stenosis and moderate to severe mitral regurgitation noted on his echocardiogram. He appears to be symptomatic with increased dyspnea on exertion for one year. Given his age and previous sternotomy doubt he would be a candidate for open aortic valve replacement/mitral valve replacement. However he may be a candidate for TAVR. Hopefully relieving his aortic stenosis would improve his mitral regurgitation. Patient is also having vague atypical chest pain. I will plan to arrange a right and left catheterization with Dr. Burt Knack. The risks and benefits were discussed and the patient agrees to proceed. Dr. Burt Knack can then decide whether he feels patient is a candidate for TAVR and whether to pursue additional preoperative evaluation. Patient would most likely require transesophageal echocardiogram to evaluate mechanism of mitral regurgitation as well. Hold Lasix for 2 days prior to and the day of his procedure. Hold lisinopril HCT the day of his procedure as  well.            Arteriosclerosis of coronary artery - Lelon Perla, MD at 04/28/2014 2:50 PM     Status: Written Related Problem: Arteriosclerosis of coronary artery   Expand All Collapse All   Continue aspirin and statin.            Benign essential HTN - Lelon Perla, MD at 04/28/2014 2:51 PM     Status: Written Related Problem: Benign essential HTN   Expand All Collapse All   Continue present blood pressure medications.            Hyperlipidemia - Lelon Perla, MD at 04/28/2014 2:51 PM     Status: Written Related Problem: Hyperlipidemia   Expand All Collapse All   DC Zocor.begin Lipitor 40 mg daily. Check lipids and liver in 4 weeks.            Chest pain - Lelon Perla, MD at 04/28/2014 2:52 PM     Status: Written Related Problem: Chest pain   Expand All Collapse All   Symptoms are atypical. Question related to aortic stenosis. Plan right and left  cardiac catheterization as described above.       For TEE to assess MR; no changes. [name]

## 2014-05-17 NOTE — Progress Notes (Signed)
Per dr Stanford Breed, unable to pass TEE probe today. Will schedule for barium swallow testing.

## 2014-05-17 NOTE — Discharge Instructions (Signed)
Conscious Sedation, Adult, Care After °Refer to this sheet in the next few weeks. These instructions provide you with information on caring for yourself after your procedure. Your health care provider may also give you more specific instructions. Your treatment has been planned according to current medical practices, but problems sometimes occur. Call your health care provider if you have any problems or questions after your procedure. °WHAT TO EXPECT AFTER THE PROCEDURE  °After your procedure: °· You may feel sleepy, clumsy, and have poor balance for several hours. °· Vomiting may occur if you eat too soon after the procedure. °HOME CARE INSTRUCTIONS °· Do not participate in any activities where you could become injured for at least 24 hours. Do not: °¨ Drive. °¨ Swim. °¨ Ride a bicycle. °¨ Operate heavy machinery. °¨ Cook. °¨ Use power tools. °¨ Climb ladders. °¨ Work from a high place. °· Do not make important decisions or sign legal documents until you are improved. °· If you vomit, drink water, juice, or soup when you can drink without vomiting. Make sure you have little or no nausea before eating solid foods. °· Only take over-the-counter or prescription medicines for pain, discomfort, or fever as directed by your health care provider. °· Make sure you and your family fully understand everything about the medicines given to you, including what side effects may occur. °· You should not drink alcohol, take sleeping pills, or take medicines that cause drowsiness for at least 24 hours. °· If you smoke, do not smoke without supervision. °· If you are feeling better, you may resume normal activities 24 hours after you were sedated. °· Keep all appointments with your health care provider. °SEEK MEDICAL CARE IF: °· Your skin is pale or bluish in color. °· You continue to feel nauseous or vomit. °· Your pain is getting worse and is not helped by medicine. °· You have bleeding or swelling. °· You are still sleepy or  feeling clumsy after 24 hours. °SEEK IMMEDIATE MEDICAL CARE IF: °· You develop a rash. °· You have difficulty breathing. °· You develop any type of allergic problem. °· You have a fever. °MAKE SURE YOU: °· Understand these instructions. °· Will watch your condition. °· Will get help right away if you are not doing well or get worse. °Document Released: 11/12/2012 Document Reviewed: 11/12/2012 °[employer]® Patient Information ©2015 [employer]. This information is not intended to replace advice given to you by your health care provider. Make sure you discuss any questions you have with your health care provider. °  °

## 2014-05-17 NOTE — CV Procedure (Signed)
Patient sedated with total 50 micrograms fentanyl and 6 mg versed; multiple attempts at passing probe (by myself and subsequently Dr Johnsie Cancel) unsuccessful. Will arrange barium swallow; if no obstruction, will try and arrange procedure using diprovan and preceded by EGD.  [name]

## 2014-05-17 NOTE — Interval H&P Note (Signed)
History and Physical Interval Note:  05/17/2014 9:06 AM  Eric Buchanan  has presented today for surgery, with the diagnosis of AORTIC STENOSIS   The various methods of treatment have been discussed with the patient and family. After consideration of risks, benefits and other options for treatment, the patient has consented to  Procedure(s): TRANSESOPHAGEAL ECHOCARDIOGRAM (TEE) (N/A) as a surgical intervention .  The patient's history has been reviewed, patient examined, no change in status, stable for surgery.  I have reviewed the patient's chart and labs.  Questions were answered to the patient's satisfaction.     [name]

## 2014-05-18 ENCOUNTER — Encounter (HOSPITAL_COMMUNITY): Payer: Self-pay | Admitting: Cardiology

## 2014-05-21 ENCOUNTER — Ambulatory Visit (HOSPITAL_COMMUNITY)
Admission: RE | Admit: 2014-05-21 | Discharge: 2014-05-21 | Disposition: A | Discharge status: Home / Self Care | Payer: Medicare Other | Source: Ambulatory Visit | Attending: Cardiology | Admitting: Cardiology

## 2014-05-21 DIAGNOSIS — K225 Diverticulum of esophagus, acquired: Secondary | ICD-10-CM | POA: Insufficient documentation

## 2014-05-21 DIAGNOSIS — K222 Esophageal obstruction: Secondary | ICD-10-CM | POA: Diagnosis not present

## 2014-05-21 HISTORY — DX: Diverticulum of esophagus, acquired: K22.5

## 2014-05-25 ENCOUNTER — Encounter: Payer: Medicare Other | Admitting: Thoracic Surgery (Cardiothoracic Vascular Surgery)

## 2014-05-26 ENCOUNTER — Encounter: Payer: Self-pay | Admitting: Cardiology

## 2014-05-26 NOTE — Progress Notes (Unsigned)
Patient presented for transesophageal echocardiogram previously and attempts at passing the probe by myself and Dr Johnsie Cancel were not successful. I therefore scheduled a barium swallow. 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 I reviewed this with Dr. Carlean Purl. He felt it would be difficult to pass the transesophageal probe. We will therefore have the patient reviewed by Dr. Roxy Manns. If we can obtain enough information concerning his mitral regurgitation from the transthoracic echocardiogram we will not pursue TEE. Our thought previously was that if severe aortic stenosis was corrected, his mitral regurgitation may improve. If after reviewing, transesophageal echocardiogram is felt absolutely necessary we will arrange combined procedure with myself and Dr Carlean Purl ( he felt he could try a pediatric endoscope while trying to guide the transesophageal echo probe). Eric Buchanan

## 2014-06-01 ENCOUNTER — Telehealth: Payer: Self-pay

## 2014-06-01 DIAGNOSIS — M5412 Radiculopathy, cervical region: Secondary | ICD-10-CM

## 2014-06-01 MED ORDER — OXYCODONE-ACETAMINOPHEN 5-325 MG PO TABS: 0.5000 | ORAL_TABLET | Freq: Three times a day (TID) | ORAL | Status: DC | PRN

## 2014-06-01 NOTE — Telephone Encounter (Signed)
Rx in box. 

## 2014-06-01 NOTE — Telephone Encounter (Signed)
Patient advised that Rx is ready for pickup. Marquon Alcala,CMA

## 2014-06-01 NOTE — Telephone Encounter (Signed)
Patient request refill for Oxycodone. Patient will come pick up when its ready. [name],CMA

## 2014-06-02 ENCOUNTER — Encounter: Payer: Medicare Other | Admitting: Thoracic Surgery (Cardiothoracic Vascular Surgery)

## 2014-06-02 ENCOUNTER — Encounter: Payer: Self-pay | Admitting: Thoracic Surgery (Cardiothoracic Vascular Surgery)

## 2014-06-02 ENCOUNTER — Institutional Professional Consult (permissible substitution) (INDEPENDENT_AMBULATORY_CARE_PROVIDER_SITE_OTHER): Payer: Medicare Other | Admitting: Thoracic Surgery (Cardiothoracic Vascular Surgery)

## 2014-06-02 ENCOUNTER — Other Ambulatory Visit: Payer: Self-pay | Admitting: *Deleted

## 2014-06-02 VITALS — BP 123/63 | HR 52 | Resp 20 | Ht 65.0 in | Wt 143.0 lb

## 2014-06-02 DIAGNOSIS — I350 Nonrheumatic aortic (valve) stenosis: Secondary | ICD-10-CM | POA: Diagnosis not present

## 2014-06-02 DIAGNOSIS — I340 Nonrheumatic mitral (valve) insufficiency: Secondary | ICD-10-CM

## 2014-06-02 DIAGNOSIS — I2510 Atherosclerotic heart disease of native coronary artery without angina pectoris: Secondary | ICD-10-CM | POA: Diagnosis not present

## 2014-06-02 DIAGNOSIS — I25700 Atherosclerosis of coronary artery bypass graft(s), unspecified, with unstable angina pectoris: Secondary | ICD-10-CM

## 2014-06-02 DIAGNOSIS — I5042 Chronic combined systolic (congestive) and diastolic (congestive) heart failure: Secondary | ICD-10-CM

## 2014-06-02 DIAGNOSIS — N189 Chronic kidney disease, unspecified: Secondary | ICD-10-CM | POA: Insufficient documentation

## 2014-06-02 DIAGNOSIS — I25110 Atherosclerotic heart disease of native coronary artery with unstable angina pectoris: Secondary | ICD-10-CM

## 2014-06-02 NOTE — Progress Notes (Addendum)
[facility]    CARDIOTHORACIC SURGERY CONSULTATION REPORT  Referring Provider is Crenshaw, Denice Bors, MD PCP is Aundria Mems, MD  Chief Complaint  Patient presents with  . Coronary Artery Disease    Surgical eval, Cardiac Cath 05/10/14. 2D Echo 03/17/14, TEE 05/17/14  . Mitral Regurgitation  . Aortic Stenosis    HPI:   Patient is an 81 year old white male with known history of coronary artery disease status post coronary artery bypass grafting in the remote past, aortic stenosis, mitral regurgitation, hypertension, cerebrovascular disease with remote history of stroke, COPD, hyperlipidemia, and chronic kidney disease who has been referred for surgical consultation to discuss treatment options for management of severe aortic stenosis, mitral regurgitation, and coronary artery disease. The patient states that he suffered a stroke in 1965. Details are unclear but the patient states that he was told it was related to smoking and high cholesterol. He quit smoking at that time. He did well until 1998 when the patient developed relatively sudden onset of symptoms of exertional chest discomfort. He was found to have severe left main coronary artery disease and underwent coronary artery bypass grafting 2 at [facility] in [place].  Details of that procedure are not currently available, but the patient states that he recovered uneventfully and did quite well for many years. He was followed intermittently for a period of time by a cardiologist in [place], but he was lost to follow-up several years ago.  The patient recently complained to his primary care physician about a one-year history of progressive symptoms of exertional shortness of breath and fatigue. He was noted to have a heart murmur on physical exam and underwent a transthoracic echocardiogram on 03/17/2014 that revealed severe aortic stenosis, moderate to severe  mitral regurgitation, and reasonably well-preserved left ventricular systolic function with ejection fraction estimated 60-65%. The patient was referred to Dr. Stanford Breed for formal cardiology evaluation. He subsequently underwent left and right heart catheterization by Dr. Burt Knack on 05/10/2014. This revealed severe left main disease and three-vessel coronary artery disease involving the native coronary arteries with patent left internal mammary artery graft to the left anterior descending coronary artery and patent but diseased saphenous vein graft to the second obtuse marginal branch coronary artery. There was diffuse distal coronary artery disease as well.  There was severe aortic stenosis with mean transvalvular gradient measured 25 mmHg at catheterization, corresponding to calculated aortic valve area of 0.8 cm. There was mild pulmonary hypertension with PA pressures measured 45/21 and mean pulmonary capillary wedge pressure 21 mmHg without large v-waves.  Mean central venous pressure was 8. The patient was scheduled for transesophageal echocardiogram to further evaluate the severity of mitral regurgitation. However, TEE probe could not be safely passed into the esophagus and the procedure was aborted. Subsequent upper GI barium swallow revealed the presence of a variable sized Zenker's diverticulum without esophageal obstruction.  The patient was referred for surgical consultation.  The patient has been widowed for the past 2 years and lives alone in a private home in [place].  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  He remains functionally independent including driving an automobile and cares for himself. He does have a daughter who lives next door and provides some assistance. He has 2 adult sons who do not live nearby.  He reports a progressive decline in his functional status over the past year  or more that has primarily been  related to progressive dyspnea and fatigue. He now gets short of breath with minimal activity and occasionally at rest. He has some degree of chest discomfort associated with shortness of breath. He sleeps on 2 pillows and cannot lay flat in bed. He has mild bilateral lower extremity edema. He denies any history of PND, dizzy spells, or syncope. He states that all of these symptoms have gradually progressed over the past year, and he feels as though he will not last much longer if he doesn't have something done relatively soon.  He also experiences chronic pain in his left knee related to degenerative arthritis. However, he states that his ambulation is limited primarily by dyspnea.   Past Medical History  Diagnosis Date  . Hypertension   . Asthma   . Gout   . Hyperlipidemia   . Chronic kidney disease   . Arthritis   . Aortic stenosis   . CAD (coronary artery disease)   . Stroke     1965  . Inguinal hernia, right 05/15/2013  . COPD (chronic obstructive pulmonary disease) 10/11/2011  . Chronic kidney disease (CKD)   . Coronary artery disease involving native coronary artery   . Coronary artery disease involving coronary bypass graft 05/10/2014  . Zenker's diverticulum 05/21/2014  . Chronic combined systolic and diastolic congestive heart failure   . Mitral regurgitation     Past Surgical History  Procedure Laterality Date  . Left and right heart catheterization with coronary/graft angiogram N/A 05/10/2014    Procedure: LEFT AND RIGHT HEART CATHETERIZATION WITH Beatrix Fetters;  Surgeon: Sherren Mocha, MD;  Location: [facility] CATH LAB;  Service: Cardiovascular;  Laterality: N/A;  . Cardiac catheterization    . Coronary artery bypass graft      1998  . Tee without cardioversion N/A 05/17/2014    Procedure: TRANSESOPHAGEAL ECHOCARDIOGRAM (TEE);  Surgeon: Lelon Perla, MD;  Location: [facility] ENDOSCOPY;  Service: Cardiovascular;  Laterality: N/A;    Family History  Problem Relation Age of  Onset  . Heart attack Mother   . Heart attack Brother     History   Social History  . Marital Status: Widowed    Spouse Name: N/A  . Number of Children: 3  . Years of Education: N/A   Occupational History  . Not on file.   Social History Main Topics  . Smoking status: Former Smoker    Quit date: 02/06/1963  . Smokeless tobacco: Not on file  . Alcohol Use: 0.0 oz/week    0 Standard drinks or equivalent per week     Comment: One beer per day  . Drug Use: No  . Sexual Activity: Not Currently   Other Topics Concern  . Not on file   Social History Narrative    Current Outpatient Prescriptions  Medication Sig Dispense Refill  . albuterol (PROVENTIL HFA;VENTOLIN HFA) 108 (90 BASE) MCG/ACT inhaler Inhale 1 puff into the lungs every 4 (four) hours as needed for shortness of breath.     . allopurinol (ZYLOPRIM) 100 MG tablet Take 1 tablet (100 mg total) by mouth daily. 90 tablet 3  . aspirin 81 MG tablet Take 81 mg by mouth at bedtime.     Marland Kitchen atorvastatin (LIPITOR) 40 MG tablet Take 1 tablet (40 mg total) by mouth daily. 90 tablet 3  . budesonide-formoterol (SYMBICORT) 160-4.5 MCG/ACT inhaler Inhale 2 puffs into the lungs 2 (two) times daily. 1 Inhaler 0  . Capsaicin-Menthol (SALONPAS GEL) 0.025-1.25 %  PTCH Apply 1 patch topically 3 (three) times daily.    . Cholecalciferol (VITAMIN D) 2000 UNITS tablet Take 2,000 Units by mouth.    . esomeprazole (NEXIUM) 40 MG capsule Take 1 capsule (40 mg total) by mouth daily at 12 noon. 90 capsule 3  . fluticasone (FLONASE) 50 MCG/ACT nasal spray One spray in each nostril twice a day, use left hand for right nostril, and right hand for left nostril. 48 g 0  . Glucosamine Sulfate-MSM 500-400 MG CAPS Take by mouth 3 (three) times daily.    Marland Kitchen lisinopril-hydrochlorothiazide (PRINZIDE,ZESTORETIC) 20-25 MG per tablet Take 0.5 tablets by mouth daily. 90 tablet 3  . oxyCODONE-acetaminophen (PERCOCET/ROXICET) 5-325 MG per tablet Take 0.5 tablets by  mouth every 8 (eight) hours as needed. 45 tablet 0  . Tiotropium Bromide Monohydrate (SPIRIVA RESPIMAT) 2.5 MCG/ACT AERS Inhale 2.5 mg into the lungs every morning.     No current facility-administered medications for this visit.    Allergies  Allergen Reactions  . Beta Adrenergic Blockers     Too low  . Hydrocodone Rash      Review of Systems:   General:  normal appetite, decreased energy, no weight gain, no weight loss, no fever  Cardiac:  + chest pain with exertion, + chest pain at rest, +SOB with minimal exertion, + intermittent resting SOB, no PND, + orthopnea, no palpitations, no arrhythmia, no atrial fibrillation, + LE edema, no dizzy spells, no syncope  Respiratory:  + progressive shortness of breath, no home oxygen, no productive cough, + chronic dry cough, no bronchitis, intermittent wheezing, no hemoptysis, + asthma, no pain with inspiration or cough, no sleep apnea, no CPAP at night  GI:   + difficulty swallowing, + reflux, no frequent heartburn, no hiatal hernia, no abdominal pain, no constipation, no diarrhea, no hematochezia, no hematemesis, no melena  GU:   no dysuria,  + frequency, no urinary tract infection, no hematuria, no enlarged prostate, no kidney stones, + chronic kidney disease  Vascular:  no pain suggestive of claudication, no pain in feet, no leg cramps, no varicose veins, no DVT, no non-healing foot ulcer  Neuro:   + remote stroke, no TIA's, no seizures, no headaches, no temporary blindness one eye,  no slurred speech, ? possible peripheral neuropathy, + chronic pain, mild instability of gait, no memory/cognitive dysfunction  Musculoskeletal: + arthritis particularly left knee, + joint swelling, no myalgias, mild difficulty walking, mildly limited mobility   Skin:   no rash, no itching, no skin infections, no pressure sores or ulcerations  Psych:   no anxiety, no depression, no nervousness, no unusual recent stress  Eyes:   + blurry vision, no floaters, +  recent vision changes, + wears glasses or contacts  ENT:   + hearing loss, no loose or painful teeth, edentulous with full dentures, last saw dentist several years ago  Hematologic:  + easy bruising, no abnormal bleeding, no clotting disorder, no frequent epistaxis  Endocrine:  no diabetes, does not check CBG's at home           Physical Exam:   BP 123/63 mmHg  Pulse 52  Resp 20  Ht 5\' 5"  (1.651 m)  Wt 143 lb (64.864 kg)  BMI 23.80 kg/m2  SpO2 93%  General:  Elderly and somewhat frail-appearing  HEENT:  Unremarkable   Neck:   no JVD, no bruits, no adenopathy   Chest:   clear to auscultation, symmetrical breath sounds, no wheezes, no rhonchi   CV:  RRR, grade IV/VI crescendo/decrescendo murmur heard best at LLSB, no diastolic murmur  Abdomen:  soft, non-tender, no masses, + right inguinal hernia  Extremities:  warm, well-perfused, pulses palpable but diminished in both groins, no LE edema  Rectal/GU  Deferred  Neuro:   Grossly non-focal and symmetrical throughout  Skin:   Clean and dry, no rashes, no breakdown, very thin and frail   Diagnostic Tests:  Transthoracic Echocardiography  Patient:  Eric Buchanan, Eric Buchanan MR #:    [id number] Study Date: 03/17/2014 Gender:   M Age:    50 Height:   165.1 cm Weight:   66.2 kg BSA:    1.75 m^2 Pt. Status: Room:  ATTENDING  Silverio Decamp ORDERING   Silverio Decamp REFERRING  Silverio Decamp SONOGRAPHER Donata Clay PERFORMING  [facility], Outpatient  cc:  ------------------------------------------------------------------- LV EF: 60% -  65%  ------------------------------------------------------------------- Indications:   Aortic stenosis 424.1.  ------------------------------------------------------------------- History:  PMH:  Coronary artery disease. Chronic obstructive pulmonary disease. Risk factors: Hypertension.  Dyslipidemia.  ------------------------------------------------------------------- Study Conclusions  - Left ventricle: The cavity size was normal. Wall thickness was increased in a pattern of moderate LVH. Systolic function was normal. The estimated ejection fraction was in the range of 60% to 65%. Wall motion was normal; there were no regional wall motion abnormalities. Doppler parameters are consistent with abnormal left ventricular relaxation (grade 1 diastolic dysfunction). The E/e&' ratio is between 8-15, suggesting indeterminate (but likely elevated) LV filling pressure. - Aortic valve: Heavily calcified with restricted leaflet motion. There is severe aortic stenosis- peak and mean gradients of 95 and 40 mmHg. Based on an LVOT diameter of 1.6 cm, the calculated AVA is 0.5-0.6 cm2. There was mild regurgitation. Valve area (VTI): 0.48 cm^2. Valve area (Vmax): 0.54 cm^2. Valve area (Vmean): 0.46 cm^2. - Mitral valve: Bileaflet thickening. Cannot r/o a rheumatic valve. There is moderate to severe central regurgitation. - Left atrium: Moderately dilated at 41 ml/m2. - Atrial septum: There was increased thickness of the septum, consistent with lipomatous hypertrophy. - Tricuspid valve: There was mild regurgitation. - Pulmonary arteries: PA peak pressure: 40 mm Hg (S).  Impressions:  - LVEF 60-65%, moderate LVH, diastolic dysfunction, likely elevated LV filling pressure, severe calcific aortic stenosis, mean gradient of 40 mmHg, AVA of 0.5-0.6 cm2, moderate to severe central MR with bileaflet thickening and sclerosis (cannot r/o a rheumatic valve), moderately dilated LA, mild TR, RVSP 40 mmHg.  ------------------------------------------------------------------- Labs, prior tests, procedures, and surgery: Coronary artery bypass grafting.  Transthoracic echocardiography. M-mode, complete 2D, spectral Doppler, and color Doppler. Birthdate:  Patient birthdate: 05-01-1925. Age: Patient is 81 yr old. Sex: Gender: male. BMI: 24.3 kg/m^2. Blood pressure:   134/65 Patient status: Outpatient. Study date: Study date: 03/17/2014. Study time: 02:14 PM. Location: Echo laboratory.  -------------------------------------------------------------------  ------------------------------------------------------------------- Left ventricle: The cavity size was normal. Wall thickness was increased in a pattern of moderate LVH. Systolic function was normal. The estimated ejection fraction was in the range of 60% to 65%. Wall motion was normal; there were no regional wall motion abnormalities. Doppler parameters are consistent with abnormal left ventricular relaxation (grade 1 diastolic dysfunction). The E/e&' ratio is between 8-15, suggesting indeterminate (but likely elevated) LV filling pressure.  ------------------------------------------------------------------- Aortic valve: Heavily calcified with restricted leaflet motion. There is severe aortic stenosis- peak and mean gradients of 95 and 40 mmHg. Based on an LVOT diameter of 1.6 cm, the calculated AVA is 0.5-0.6 cm2. Doppler: There was mild regurgitation.  VTI ratio of LVOT to aortic  valve: 0.24. Valve area (VTI): 0.48 cm^2. Indexed valve area (VTI): 0.27 cm^2/m^2. Valve area (Vmax): 0.54 cm^2. Indexed valve area (Vmax): 0.31 cm^2/m^2. Mean velocity ratio of LVOT to aortic valve: 0.23. Valve area (Vmean): 0.46 cm^2. Indexed valve area (Vmean): 0.27 cm^2/m^2.  Mean gradient (S): 40 mm Hg. Peak gradient (S): 78 mm Hg.  ------------------------------------------------------------------- Aorta: Aortic root: The aortic root was normal in size. Ascending aorta: The ascending aorta was normal in size.  ------------------------------------------------------------------- Mitral valve: Bileaflet thickening. Cannot r/o a rheumatic valve. There is moderate to severe  central regurgitation.  ------------------------------------------------------------------- Left atrium: Moderately dilated at 41 ml/m2.  ------------------------------------------------------------------- Atrial septum: There was increased thickness of the septum, consistent with lipomatous hypertrophy.  ------------------------------------------------------------------- Right ventricle: The cavity size was normal. Wall thickness was normal. Systolic function was normal.  ------------------------------------------------------------------- Pulmonic valve:  The valve appears to be grossly normal. Doppler: There was mild regurgitation.  ------------------------------------------------------------------- Tricuspid valve:  Doppler: There was mild regurgitation.  ------------------------------------------------------------------- Pulmonary artery:  The main pulmonary artery was normal-sized.  ------------------------------------------------------------------- Right atrium: The atrium was normal in size.  ------------------------------------------------------------------- Pericardium: There was no pericardial effusion.  ------------------------------------------------------------------- Systemic veins: Inferior vena cava: The vessel was normal in size. The respirophasic diameter changes were in the normal range (= 50%), consistent with normal central venous pressure.  ------------------------------------------------------------------- Measurements  Left ventricle              Value     Reference LV ID, ED, PLAX chordal      (L)   40.9 mm    43 - 52 LV ID, ES, PLAX chordal          28.3 mm    23 - 38 LV fx shortening, PLAX chordal      31  %    >=29 LV PW thickness, ED            10.3 mm    --------- IVS/LV PW ratio, ED            0.88      <=1.3 Stroke volume, 2D              46  ml    --------- Stroke volume/bsa, 2D           26  ml/m^2  ---------  Ventricular septum            Value     Reference IVS thickness, ED             9.02 mm    ---------  LVOT                   Value     Reference LVOT ID, S                16  mm    --------- LVOT area                 2.01 cm^2   --------- LVOT peak velocity, S           119  cm/s   --------- LVOT mean velocity, S           67.5 cm/s   --------- LVOT VTI, S                22.8 cm    --------- LVOT peak gradient, S           6   mm Hg  ---------  Aortic valve  Value     Reference Aortic valve mean velocity, S       292  cm/s   --------- Aortic valve VTI, S            95  cm    --------- Aortic mean gradient, S          40  mm Hg  --------- Aortic peak gradient, S          78  mm Hg  --------- VTI ratio, LVOT/AV            0.24      --------- Aortic valve area, VTI          0.48 cm^2   --------- Aortic valve area/bsa, VTI        0.27 cm^2/m^2 --------- Aortic valve area, peak velocity     0.54 cm^2   --------- Aortic valve area/bsa, peak        0.31 cm^2/m^2 --------- velocity Velocity ratio, mean, LVOT/AV       0.23      --------- Aortic valve area, mean velocity     0.46 cm^2   --------- Aortic valve area/bsa, mean        0.27 cm^2/m^2 --------- velocity Aortic regurg pressure half-time     377  ms    ---------  Aorta                   Value     Reference Aortic root ID, ED            30  mm    ---------  Left atrium                Value     Reference LA ID, A-P,  ES              48  mm    --------- LA ID/bsa, A-P          (H)   2.74 cm/m^2  <=2.2 LA volume, S               70  ml    --------- LA volume/bsa, S             40  ml/m^2  --------- LA volume, ES, 1-p A4C          66  ml    --------- LA volume/bsa, ES, 1-p A4C        37.7 ml/m^2  --------- LA volume, ES, 1-p A2C          68  ml    --------- LA volume/bsa, ES, 1-p A2C        38.8 ml/m^2  ---------  Mitral valve               Value     Reference Mitral regurg VTI, PISA          187  cm    --------- Mitral ERO, PISA             0.15 cm^2   --------- Mitral regurg volume, PISA        28  ml    ---------  Pulmonary arteries            Value     Reference PA pressure, S, DP        (H)   40  mm Hg  <=30  Tricuspid valve              Value  Reference Tricuspid regurg peak velocity      306  cm/s   --------- Tricuspid peak RV-RA gradient       37  mm Hg  ---------  Systemic veins              Value     Reference Estimated CVP               3   mm Hg  ---------  Right ventricle              Value     Reference RV pressure, S, DP        (H)   40  mm Hg  <=30  Pulmonic valve              Value     Reference Pulmonic regurg velocity, ED       87.7 cm/s   ---------  Legend: (L) and (H) mark values outside specified reference range.  ------------------------------------------------------------------- Prepared and Electronically Authenticated by  Lyman Bishop MD 2016-02-10T17:11:56    Cardiac Catheterization Procedure Note  Name: Keino Placencia MRN: [id number] DOB: 10/26/1925  Procedure: Right Heart Cath, Left  Heart Cath, Selective Coronary Angiography, SVG angiography, LIMA angiography  Indication: Aortic stenosis and mitral regurgitation. 81 year old gentleman with history of remote CABG in 1999. He has developed progressive exertional dyspnea over the last year, now with New [place] Heart Association functional class III symptoms. Echocardiography has demonstrated findings consistent with severe aortic stenosis and moderately severe mitral regurgitation. He has preserved LV systolic function. He is referred for cardiac catheterization to further evaluate treatment options for his coronary and valvular heart disease.  Procedural Details: The right groin was prepped, draped, and anesthetized with 1% lidocaine. Using the modified Seldinger technique a 5 French sheath was placed in the right femoral artery and a 7 French sheath was placed in the right femoral vein. A Swan-Ganz catheter was used for the right heart catheterization. Standard protocol was followed for recording of right heart pressures and sampling of oxygen saturations. Fick cardiac output was calculated. Standard Judkins catheters were used for selective coronary angiography, saphenous vein graft angiography, and LIMA angiography. The JR4 catheter was used to cross the aortic valve and record left ventricular pressure. The same catheter was used for saphenous vein graft angiography. A LIMA catheter was used for LIMA angiography. Ventriculography was deferred because of chronic kidney disease.. There were no immediate procedural complications. The patient was transferred to the post catheterization recovery area for further monitoring.  Procedural Findings: Hemodynamics RA 8 RV 50/9 PA 45/21 with a mean of 35 PCWP A wave 19, V-wave 19, mean of 21 LV 169/13 AO 147/74 with a mean of 104  Oxygen saturations: PA 62 AO 94  Cardiac Output (Fick) 4.0  Cardiac Index (Fick) 2.35  Aortic valve hemodynamics: Mean gradient 25 mmHg, aortic valve  area 0.8 cm  Coronary angiography: Coronary dominance: right  Left mainstem: Heavy calcification with 95% distal left main stenosis  Left anterior descending (LAD): The LAD is severely calcified. The vessel is totally occluded proximally.  Left circumflex (LCx): The left circumflex is severely diseased. There is 95% ostial stenosis. The obtuse marginal branches are occluded. The distal posterolateral branches are small, but patent. There is diffuse 80-90% mid left circumflex stenosis.  Right coronary artery (RCA): The RCA is dominant. The vessel is diffusely calcified. The mid RCA has 80% stenosis. The PLA branch is patent. The PDA branch has area severe diffuse disease with  multiple sequential 90-95% stenoses. The vessel is small beyond the area of severe stenosis.  Saphenous vein graft to OM 2: The graft is patent. The mid body of the graft as eccentric 75% stenosis. Beyond the graft insertion site, the native OM 2 is severely diseased with 95% stenosis into a small subbranch. The first OM is also small and it fills retrograde from the graft insertion site.  LIMA to LAD: The LIMA is a large conduit. The vessel is widely patent. The anastomotic site is intact. The LAD is patent and there is 80% stenosis in the apical portion of the LAD. The diagonal and septal perforator branches all fill from the graft.  Left ventriculography: Deferred because of chronic kidney disease  Estimated Blood Loss: minimal  Final Conclusions:  1. Severe three-vessel coronary artery disease 2. Status post aortocoronary bypass surgery with continued patency of the LIMA to LAD and saphenous vein graft to OM 2 3. Severe aortic stenosis 4. Known moderate to severe mitral regurgitation based on echo findings 5. Moderate point hypertension, likely related to left heart disease  Recommendations: The patient has diffuse distal vessel CAD. He has at least moderate stenosis of the saphenous vein graft to OM 2. He  does not have very good revascularization options because of the diffuse nature of his CAD and distal vessel location. The patient denies symptoms of angina and primarily is limited by shortness of breath. He has severe valvular disease involving both the aortic and mitral valves. I reviewed his echo images and agree that he has severe aortic stenosis and probably severe mitral regurgitation. I think it TEE would be helpful to better delineate his treatment options. If he has purely functional mitral regurgitation, this may improve significantly with treatment of his aortic stenosis. After his TEE, would recommend formal cardiac surgical evaluation for further consideration of treatment options.  Sherren Mocha    ESOPHOGRAM / BARIUM SWALLOW / BARIUM TABLET STUDY  TECHNIQUE: Combined double contrast and single contrast examination performed using effervescent crystals, thick barium liquid, and thin barium liquid. The patient was observed with fluoroscopy swallowing a 62mm barium sulphate tablet.  FLUOROSCOPY TIME: Fluoroscopy Time: 1 min 8 seconds  COMPARISON: None.  FINDINGS: The patient has persistent spasm of the cricopharyngeus muscle with formation of a variable sized Zenker's diverticulum. The diverticulum has a variable broad neck. The rest of the esophagus appears normal. There is no stricture or mass. No esophagitis. No visible hiatal hernia.  A 13 mm barium tablet passed immediately from the mouth to the stomach with no delay. The patient reports that pills do lodge in the diverticulum since he can taste them for a long time after swallowing them.  The patient has prominent residual in the piriform sinuses after swallowing but there was no visible aspiration.  IMPRESSION: Persistent prominent spasm of the cricopharyngeus muscle with a variable size pulsion diverticulum at the C6-7 level just above the cricopharyngeus.   Electronically Signed  By: Lorriane Shire M.D.  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  The patient also has at least moderate mitral regurgitation. He presents with progressive symptoms of exertional shortness of breath and fatigue consistent with chronic combined systolic and diastolic congestive heart failure, currently New York Heart Association functional class III.  I have personally reviewed the patient's recent transthoracic echocardiogram and diagnostic cardiac catheterization. The patient has classical findings of severe calcific aortic stenosis with severe thickening, calcification, and restricted leaflet mobility involving all 3 leaflets of the aortic valve. Peak velocity across the aortic valve measured in excess of 4.5 m/s corresponding to a mean transvalvular gradient of 44 mmHg.  There is at least moderate mitral regurgitation, although pulmonary artery pressures were only mildly elevated and there were not large v-waves noted at the time of right heart catheterization. By transthoracic echocardiogram the mitral regurgitation is central and directed slightly posteriorly.  It may be primarily related to type I dysfunction, although there may be some component of both type IIIA and type IIIB dysfunction as well.  There is some thickening of both leaflets of the mitral valve and there may be significant annular calcification.  Quantification of the severity of mitral regurgitation was notable for the presence of an ERO measured 0.15 cm with a calculated  regurgitant volume of 28 mL.  An attempt at transesophageal echocardiogram to sort this out further was aborted because of difficulty with intubation of the esophagus from cricopharyngeus muscle spasm, and the patient has subsequently been found to have a Zenker's diverticulum.  Coronary arteriography demonstrates the presence of severe three-vessel coronary artery disease with continued patency of both bypass grafts placed at the time of surgery in 1998 including a patent left internal mammary artery to the distal left anterior descending coronary artery and a patent vein graft to a large obtuse marginal branch. There is some progression of distal disease in all vascular territories, including 80% stenosis of the mid right coronary artery and diffuse high grade stenosis of the posterior descending coronary artery.  Risks associated with conventional surgery would be extremely high.  I would not consider this elderly gentleman a candidate for redo coronary artery bypass grafting, aortic valve replacement, and mitral valve repair or replacement.  Options include long-term palliative medical therapy versus a targeted less invasive approach including TAVR.  The patient's coronary artery disease could potentially be treated using PCI if necessary, all though at this point the patient's primary symptomatology relates to his valvular disease and congestive heart failure.  I think it would be reasonable to proceed with further diagnostic testing to ascertain the anatomical feasibility of TAVR to address the patient's aortic stenosis. If the patient demonstrates symptoms and findings consistent with severe symptomatic mitral regurgitation following successful TAVR, it is conceivable that his mitral valve disease could be treated using an edge to edge clip.     Plan:  The patient was counseled at length regarding treatment alternatives for management of severe symptomatic aortic stenosis, mitral regurgitation and  coronary artery disease.  Alternative approaches such as conventional surgery, transcatheter aortic valve replacement with or without PCI, and palliative medical therapy were compared and contrasted at length.  The risks associated with conventional surgery were discussed in detail, as were the reasons why I feel the patient should consider less invasive alternatives. Long-term prognosis with palliative medical therapy was discussed. This discussion was placed in the context of the patient's own specific clinical presentation and past medical history.  All of his  questions been addressed.  The patient desires to proceed with further diagnostic testing to characterize the potential risks and benefits of TAVR.  He will undergo cardiac dated CT angiogram of the heart, CT angiogram of the aorta and iliac vessels, pulmonary function testing, and a formal physical therapy evaluation.  Because of his underlying chronic kidney disease his CT angiograms will be staged at different times to minimize the risk of contrast nephropathy. The patient will return in 3-4 weeks to review the results of these tests and discuss treatment options further.  At some point he will need to be referred to a otolaryngologist for treatment of his dysphagia and Zenker's diverticulum.     Valentina Gu. Roxy Manns, MD 06/02/2014 11:07 AM

## 2014-06-03 ENCOUNTER — Encounter: Payer: Self-pay | Admitting: Thoracic Surgery (Cardiothoracic Vascular Surgery)

## 2014-06-03 ENCOUNTER — Other Ambulatory Visit: Payer: Self-pay | Admitting: *Deleted

## 2014-06-03 DIAGNOSIS — I350 Nonrheumatic aortic (valve) stenosis: Secondary | ICD-10-CM

## 2014-06-03 DIAGNOSIS — N289 Disorder of kidney and ureter, unspecified: Secondary | ICD-10-CM

## 2014-06-08 ENCOUNTER — Encounter (HOSPITAL_COMMUNITY)
Admission: RE | Admit: 2014-06-08 | Discharge: 2014-06-08 | Disposition: A | Discharge status: Home / Self Care | Payer: Medicare Other | Source: Ambulatory Visit | Attending: Thoracic Surgery (Cardiothoracic Vascular Surgery) | Admitting: Thoracic Surgery (Cardiothoracic Vascular Surgery)

## 2014-06-08 ENCOUNTER — Ambulatory Visit (HOSPITAL_COMMUNITY)
Admission: RE | Admit: 2014-06-08 | Discharge: 2014-06-08 | Disposition: A | Discharge status: Home / Self Care | Payer: Medicare Other | Source: Ambulatory Visit | Attending: Thoracic Surgery (Cardiothoracic Vascular Surgery) | Admitting: Thoracic Surgery (Cardiothoracic Vascular Surgery)

## 2014-06-08 VITALS — BP 137/55 | HR 75 | Resp 20 | Wt 143.0 lb

## 2014-06-08 DIAGNOSIS — N289 Disorder of kidney and ureter, unspecified: Secondary | ICD-10-CM | POA: Insufficient documentation

## 2014-06-08 DIAGNOSIS — I350 Nonrheumatic aortic (valve) stenosis: Secondary | ICD-10-CM

## 2014-06-08 LAB — BASIC METABOLIC PANEL
BUN: 21 mg/dL (ref 6–23)
CO2: 25 mEq/L (ref 19–32)
Calcium: 9.3 mg/dL (ref 8.4–10.5)
Chloride: 93 mEq/L — ABNORMAL LOW (ref 96–112)
Creat: 1.22 mg/dL (ref 0.50–1.35)
Glucose, Bld: 92 mg/dL (ref 70–99)
Potassium: 4.7 mEq/L (ref 3.5–5.3)
Sodium: 126 mEq/L — ABNORMAL LOW (ref 135–145)

## 2014-06-08 MED ORDER — SODIUM BICARBONATE 8.4 % IV SOLN
INTRAVENOUS | Status: AC
  Administered 2014-06-08: 10:00:00 via INTRAVENOUS
  Filled 2014-06-08: qty 75

## 2014-06-08 MED ORDER — SODIUM BICARBONATE BOLUS VIA INFUSION
INTRAVENOUS | Status: AC
  Administered 2014-06-08: 09:00:00 via INTRAVENOUS
  Filled 2014-06-08: qty 1

## 2014-06-08 MED ORDER — IOHEXOL 350 MG/ML SOLN
80.0000 mL | Freq: Once | INTRAVENOUS | Status: AC | PRN
  Administered 2014-06-08: 80 mL via INTRAVENOUS

## 2014-06-08 MED ORDER — SODIUM BICARBONATE 8.4 % IV SOLN
INTRAVENOUS | Status: DC
  Filled 2014-06-08: qty 500

## 2014-06-10 ENCOUNTER — Ambulatory Visit (INDEPENDENT_AMBULATORY_CARE_PROVIDER_SITE_OTHER): Payer: Medicare Other | Admitting: Sports Medicine

## 2014-06-10 DIAGNOSIS — I2510 Atherosclerotic heart disease of native coronary artery without angina pectoris: Secondary | ICD-10-CM

## 2014-06-10 DIAGNOSIS — R071 Chest pain on breathing: Secondary | ICD-10-CM | POA: Diagnosis not present

## 2014-06-10 DIAGNOSIS — I5042 Chronic combined systolic (congestive) and diastolic (congestive) heart failure: Secondary | ICD-10-CM

## 2014-06-10 LAB — COMPREHENSIVE METABOLIC PANEL WITH GFR
ALT: 26 U/L (ref 0–53)
AST: 24 U/L (ref 0–37)
Albumin: 3.8 g/dL (ref 3.5–5.2)
Alkaline Phosphatase: 114 U/L (ref 39–117)
BUN: 24 mg/dL — ABNORMAL HIGH (ref 6–23)
CO2: 19 meq/L (ref 19–32)
Calcium: 9.3 mg/dL (ref 8.4–10.5)
Glucose, Bld: 94 mg/dL (ref 70–99)
Sodium: 126 meq/L — ABNORMAL LOW (ref 135–145)

## 2014-06-10 LAB — CBC
HCT: 34.4 % — ABNORMAL LOW (ref 39.0–52.0)
Hemoglobin: 11.8 g/dL — ABNORMAL LOW (ref 13.0–17.0)
MCH: 30.6 pg (ref 26.0–34.0)
MCHC: 34.3 g/dL (ref 30.0–36.0)
MCV: 89.1 fL (ref 78.0–100.0)
MPV: 9.6 fL (ref 8.6–12.4)
Platelets: 316 10*3/uL (ref 150–400)
RBC: 3.86 MIL/uL — ABNORMAL LOW (ref 4.22–5.81)
RDW: 15.4 % (ref 11.5–15.5)
WBC: 6 10*3/uL (ref 4.0–10.5)

## 2014-06-10 LAB — COMPREHENSIVE METABOLIC PANEL
Chloride: 93 mEq/L — ABNORMAL LOW (ref 96–112)
Creat: 1.48 mg/dL — ABNORMAL HIGH (ref 0.50–1.35)
Potassium: 4.7 mEq/L (ref 3.5–5.3)
Total Bilirubin: 1.2 mg/dL (ref 0.2–1.2)
Total Protein: 6.4 g/dL (ref 6.0–8.3)

## 2014-06-10 MED ORDER — FUROSEMIDE 40 MG PO TABS: 40.0000 mg | ORAL_TABLET | Freq: Every day | ORAL | Status: DC

## 2014-06-10 NOTE — Assessment & Plan Note (Signed)
Currently having increasing shortness of breath with a normal pulse ox today.  this is likely due to a congestive heart failure exacerbation considering increasing lower extremity edema and bibasilar crackles and rales.  he does have severe aortic stenosis.

## 2014-06-10 NOTE — Progress Notes (Signed)
  Subjective:    CC: shortness of breath  HPI: Eric Buchanan returns, he has known severe heart failure, severe aortic stenosis, and COPD. For the past several days he's been having increasing shortness of breath, no cough, no wheeze, no constitutional symptoms. He is being worked up for consideration of percutaneous aortic valvuloplasty.  Past medical history, Surgical history, Family history not pertinant except as noted below, Social history, Allergies, and medications have been entered into the medical record, reviewed, and no changes needed.   Review of Systems: No fevers, chills, night sweats, weight loss, chest pain, or shortness of breath.   Objective:    General: Well Developed, well nourished, and in no acute distress.  Neuro: Alert and oriented x3, extra-ocular muscles intact, sensation grossly intact.  HEENT: Normocephalic, atraumatic, pupils equal round reactive to light, neck supple, no masses, no lymphadenopathy, thyroid nonpalpable.  Skin: Warm and dry, no rashes. Cardiac: Regular rate and rhythm, no murmurs rubs or gallops, 2+ symmetric lower extremity edema.  Respiratory: Clear to auscultation bilaterally.cor sounds at the bases bilaterally Not using accessory muscles, speaking in full sentences.  Impression and Recommendations:

## 2014-06-11 ENCOUNTER — Ambulatory Visit (HOSPITAL_BASED_OUTPATIENT_CLINIC_OR_DEPARTMENT_OTHER)
Admission: RE | Admit: 2014-06-11 | Discharge: 2014-06-11 | Disposition: A | Discharge status: Home / Self Care | Payer: Medicare Other | Source: Ambulatory Visit | Attending: Sports Medicine | Admitting: Sports Medicine

## 2014-06-11 ENCOUNTER — Ambulatory Visit (HOSPITAL_BASED_OUTPATIENT_CLINIC_OR_DEPARTMENT_OTHER): Payer: Medicare Other

## 2014-06-11 ENCOUNTER — Encounter (HOSPITAL_BASED_OUTPATIENT_CLINIC_OR_DEPARTMENT_OTHER): Payer: Self-pay

## 2014-06-11 DIAGNOSIS — J439 Emphysema, unspecified: Secondary | ICD-10-CM | POA: Insufficient documentation

## 2014-06-11 DIAGNOSIS — R600 Localized edema: Secondary | ICD-10-CM | POA: Insufficient documentation

## 2014-06-11 DIAGNOSIS — I5042 Chronic combined systolic (congestive) and diastolic (congestive) heart failure: Secondary | ICD-10-CM

## 2014-06-11 DIAGNOSIS — J984 Other disorders of lung: Secondary | ICD-10-CM | POA: Insufficient documentation

## 2014-06-11 DIAGNOSIS — J90 Pleural effusion, not elsewhere classified: Secondary | ICD-10-CM | POA: Insufficient documentation

## 2014-06-11 DIAGNOSIS — R0602 Shortness of breath: Secondary | ICD-10-CM | POA: Diagnosis not present

## 2014-06-11 LAB — D-DIMER, QUANTITATIVE: D-Dimer, Quant: 1.51 ug/mL-FEU — ABNORMAL HIGH (ref 0.00–0.48)

## 2014-06-11 LAB — BRAIN NATRIURETIC PEPTIDE: Brain Natriuretic Peptide: 800.6 pg/mL — ABNORMAL HIGH (ref 0.0–100.0)

## 2014-06-11 MED ORDER — IOHEXOL 350 MG/ML SOLN
80.0000 mL | Freq: Once | INTRAVENOUS | Status: AC | PRN
  Administered 2014-06-11: 80 mL via INTRAVENOUS

## 2014-06-11 NOTE — Assessment & Plan Note (Signed)
Presentation with shortness of breath, after percutaneous cardiac procedures, with an elevated d-dimer, and leg swelling. CT angiogram of the pulmonary arteries and lower extreme he Dopplers, both at [facility] [place], [place]. Renal function is approximately at baseline.

## 2014-06-11 NOTE — Addendum Note (Signed)
Addended by: Silverio Decamp on: 06/11/2014 08:51 AM   Modules accepted: Orders

## 2014-06-14 ENCOUNTER — Ambulatory Visit (HOSPITAL_COMMUNITY)
Admission: RE | Admit: 2014-06-14 | Discharge: 2014-06-14 | Disposition: A | Discharge status: Home / Self Care | Payer: Medicare Other | Source: Ambulatory Visit | Attending: Thoracic Surgery (Cardiothoracic Vascular Surgery) | Admitting: Thoracic Surgery (Cardiothoracic Vascular Surgery)

## 2014-06-14 ENCOUNTER — Other Ambulatory Visit: Payer: Self-pay | Admitting: *Deleted

## 2014-06-14 DIAGNOSIS — I350 Nonrheumatic aortic (valve) stenosis: Secondary | ICD-10-CM | POA: Diagnosis not present

## 2014-06-14 DIAGNOSIS — Z7951 Long term (current) use of inhaled steroids: Secondary | ICD-10-CM | POA: Insufficient documentation

## 2014-06-14 DIAGNOSIS — N289 Disorder of kidney and ureter, unspecified: Secondary | ICD-10-CM

## 2014-06-14 DIAGNOSIS — R0609 Other forms of dyspnea: Secondary | ICD-10-CM | POA: Diagnosis not present

## 2014-06-14 DIAGNOSIS — Z87891 Personal history of nicotine dependence: Secondary | ICD-10-CM | POA: Diagnosis not present

## 2014-06-14 DIAGNOSIS — Z01818 Encounter for other preprocedural examination: Secondary | ICD-10-CM | POA: Diagnosis present

## 2014-06-14 LAB — PULMONARY FUNCTION TEST
DL/VA % pred: 70 %
DL/VA: 2.97 ml/min/mmHg/L
DLCO COR % PRED: 38 %
DLCO cor: 9.95 ml/min/mmHg
DLCO unc % pred: 35 %
DLCO unc: 9.06 ml/min/mmHg
FEF 25-75 Post: 2.35 L/sec
FEF 25-75 Pre: 1.79 L/sec
FEF2575-%Change-Post: 31 %
FEF2575-%PRED-PRE: 149 %
FEF2575-%Pred-Post: 196 %
FEV1-%Change-Post: 6 %
FEV1-%Pred-Post: 99 %
FEV1-%Pred-Pre: 93 %
FEV1-PRE: 1.85 L
FEV1-Post: 1.97 L
FEV1FVC-%CHANGE-POST: 5 %
FEV1FVC-%Pred-Pre: 113 %
FEV6-%Change-Post: 2 %
FEV6-%Pred-Post: 88 %
FEV6-%Pred-Pre: 86 %
FEV6-PRE: 2.3 L
FEV6-Post: 2.35 L
FEV6FVC-%Change-Post: 1 %
FEV6FVC-%PRED-POST: 109 %
FEV6FVC-%Pred-Pre: 108 %
FVC-%Change-Post: 0 %
FVC-%PRED-POST: 80 %
FVC-%Pred-Pre: 79 %
FVC-POST: 2.35 L
FVC-Pre: 2.33 L
POST FEV6/FVC RATIO: 100 %
Post FEV1/FVC ratio: 84 %
Pre FEV1/FVC ratio: 79 %
Pre FEV6/FVC Ratio: 99 %
RV % pred: 86 %
RV: 2.18 L
TLC % PRED: 75 %
TLC: 4.58 L

## 2014-06-14 MED ORDER — ALBUTEROL SULFATE (2.5 MG/3ML) 0.083% IN NEBU
2.5000 mg | INHALATION_SOLUTION | Freq: Once | RESPIRATORY_TRACT | Status: AC
  Administered 2014-06-14: 2.5 mg via RESPIRATORY_TRACT

## 2014-06-15 ENCOUNTER — Encounter (HOSPITAL_COMMUNITY): Payer: Medicare Other

## 2014-06-16 ENCOUNTER — Encounter (HOSPITAL_COMMUNITY): Payer: Self-pay

## 2014-06-16 ENCOUNTER — Ambulatory Visit (HOSPITAL_COMMUNITY): Payer: Medicare Other

## 2014-06-16 ENCOUNTER — Ambulatory Visit: Payer: Medicare Other | Admitting: Physical Therapy

## 2014-06-16 ENCOUNTER — Ambulatory Visit (HOSPITAL_COMMUNITY)
Admission: RE | Admit: 2014-06-16 | Discharge: 2014-06-16 | Disposition: A | Discharge status: Home / Self Care | Payer: Medicare Other | Source: Ambulatory Visit | Attending: Thoracic Surgery (Cardiothoracic Vascular Surgery) | Admitting: Thoracic Surgery (Cardiothoracic Vascular Surgery)

## 2014-06-16 VITALS — BP 133/69 | HR 87 | Temp 97.5°F | Resp 18

## 2014-06-16 DIAGNOSIS — K449 Diaphragmatic hernia without obstruction or gangrene: Secondary | ICD-10-CM | POA: Diagnosis not present

## 2014-06-16 DIAGNOSIS — R590 Localized enlarged lymph nodes: Secondary | ICD-10-CM | POA: Diagnosis not present

## 2014-06-16 DIAGNOSIS — K5730 Diverticulosis of large intestine without perforation or abscess without bleeding: Secondary | ICD-10-CM | POA: Insufficient documentation

## 2014-06-16 DIAGNOSIS — Z951 Presence of aortocoronary bypass graft: Secondary | ICD-10-CM | POA: Insufficient documentation

## 2014-06-16 DIAGNOSIS — K8020 Calculus of gallbladder without cholecystitis without obstruction: Secondary | ICD-10-CM | POA: Insufficient documentation

## 2014-06-16 DIAGNOSIS — R918 Other nonspecific abnormal finding of lung field: Secondary | ICD-10-CM | POA: Diagnosis not present

## 2014-06-16 DIAGNOSIS — N289 Disorder of kidney and ureter, unspecified: Secondary | ICD-10-CM

## 2014-06-16 DIAGNOSIS — I517 Cardiomegaly: Secondary | ICD-10-CM | POA: Insufficient documentation

## 2014-06-16 DIAGNOSIS — Z01818 Encounter for other preprocedural examination: Secondary | ICD-10-CM | POA: Insufficient documentation

## 2014-06-16 DIAGNOSIS — K4090 Unilateral inguinal hernia, without obstruction or gangrene, not specified as recurrent: Secondary | ICD-10-CM | POA: Diagnosis not present

## 2014-06-16 DIAGNOSIS — I350 Nonrheumatic aortic (valve) stenosis: Secondary | ICD-10-CM

## 2014-06-16 MED ORDER — IOHEXOL 350 MG/ML SOLN
80.0000 mL | Freq: Once | INTRAVENOUS | Status: AC | PRN
  Administered 2014-06-16: 80 mL via INTRAVENOUS

## 2014-06-16 MED ORDER — SODIUM BICARBONATE 8.4 % IV SOLN
INTRAVENOUS | Status: AC
  Administered 2014-06-16: 10:00:00 via INTRAVENOUS
  Filled 2014-06-16: qty 500

## 2014-06-16 MED ORDER — SODIUM BICARBONATE BOLUS VIA INFUSION
INTRAVENOUS | Status: AC
  Administered 2014-06-16: 09:00:00 via INTRAVENOUS
  Filled 2014-06-16: qty 1

## 2014-06-16 NOTE — Progress Notes (Signed)
Pt arrived from radiology to complete Bicarb gtt. Pt stable. VSS. Bicarb gtt infusing without difficulty.

## 2014-06-17 ENCOUNTER — Ambulatory Visit (INDEPENDENT_AMBULATORY_CARE_PROVIDER_SITE_OTHER): Payer: Medicare Other | Admitting: Sports Medicine

## 2014-06-17 ENCOUNTER — Encounter: Payer: Self-pay | Admitting: Sports Medicine

## 2014-06-17 VITALS — BP 127/73 | HR 44 | Ht 65.0 in | Wt 141.0 lb

## 2014-06-17 DIAGNOSIS — I5042 Chronic combined systolic (congestive) and diastolic (congestive) heart failure: Secondary | ICD-10-CM

## 2014-06-17 DIAGNOSIS — Z79899 Other long term (current) drug therapy: Secondary | ICD-10-CM

## 2014-06-17 DIAGNOSIS — Z5181 Encounter for therapeutic drug level monitoring: Secondary | ICD-10-CM

## 2014-06-17 DIAGNOSIS — I2510 Atherosclerotic heart disease of native coronary artery without angina pectoris: Secondary | ICD-10-CM

## 2014-06-17 MED ORDER — AZITHROMYCIN 250 MG PO TABS: ORAL_TABLET | ORAL | Status: DC

## 2014-06-17 MED ORDER — DIGOXIN 125 MCG PO TABS: 125.0000 ug | ORAL_TABLET | Freq: Every day | ORAL | Status: DC

## 2014-06-17 NOTE — Progress Notes (Signed)
  Subjective:    CC: follow-up  HPI: Eric Buchanan returns, he continues to undergo a workup in anticipation of a transcutaneous type aortic valve replacement. He did have what appeared to be a mild exacerbation of heart failure at the last visit with shortness of breath and bibasilar crackles. He feels a little bit better with Lasix however continues to have significant shortness of breath. He no longer has any swelling in his legs. No chest pain.  Past medical history, Surgical history, Family history not pertinant except as noted below, Social history, Allergies, and medications have been entered into the medical record, reviewed, and no changes needed.   Review of Systems: No fevers, chills, night sweats, weight loss, chest pain, or shortness of breath.   Objective:    General: Well Developed, well nourished, and in no acute distress.  Neuro: Alert and oriented x3, extra-ocular muscles intact, sensation grossly intact.  HEENT: Normocephalic, atraumatic, pupils equal round reactive to light, neck supple, no masses, no lymphadenopathy, thyroid nonpalpable.  Skin: Warm and dry, no rashes. Cardiac: Regular rate and rhythm, no murmurs rubs or gallops, no lower extremity edema.  Respiratory:  Course in the left base. Not using accessory muscles, speaking in full sentences.  Impression and Recommendations:

## 2014-06-17 NOTE — Assessment & Plan Note (Signed)
Persistent severe heart failure , we did hit him with Lasix at the last visit and he is dropped approximately 4 pounds.  he does have persistence of a left-sided pleural effusion with coarse sounds, the right base sounds clear.  He is euvolemic on examination without any lower extremity edema.  symptomatically, he still has shortness of breath.  he is currently also being worked up for a percutaneous type aortic valve replacement 4 severe stenosis.  at this point I'm struggling to find a way to relieve his symptoms until the procedure, I'm going start  Digoxin, 125 g daily without loading, we can recheck levels in 2 weeks.  considering his right base course sounds, as well as evidence of consolidation on his x-ray as well as effusion, we are going to do a course of antibiotics.

## 2014-06-18 LAB — DIGOXIN LEVEL: Digoxin Level: 0.2 ng/mL — ABNORMAL LOW (ref 0.8–2.0)

## 2014-06-18 NOTE — Addendum Note (Signed)
Addended by: Silverio Decamp on: 06/18/2014 10:41 AM   Modules accepted: Orders

## 2014-06-21 ENCOUNTER — Ambulatory Visit: Payer: Medicare Other | Attending: Thoracic Surgery (Cardiothoracic Vascular Surgery) | Admitting: Physical Therapy

## 2014-06-21 ENCOUNTER — Encounter: Payer: Self-pay | Admitting: Physical Therapy

## 2014-06-21 ENCOUNTER — Ambulatory Visit (INDEPENDENT_AMBULATORY_CARE_PROVIDER_SITE_OTHER): Payer: Medicare Other | Admitting: Thoracic Surgery (Cardiothoracic Vascular Surgery)

## 2014-06-21 ENCOUNTER — Encounter: Payer: Self-pay | Admitting: Thoracic Surgery (Cardiothoracic Vascular Surgery)

## 2014-06-21 VITALS — BP 134/73 | HR 82 | Resp 21 | Ht 65.0 in | Wt 141.0 lb

## 2014-06-21 DIAGNOSIS — Z951 Presence of aortocoronary bypass graft: Secondary | ICD-10-CM | POA: Diagnosis not present

## 2014-06-21 DIAGNOSIS — I5042 Chronic combined systolic (congestive) and diastolic (congestive) heart failure: Secondary | ICD-10-CM | POA: Diagnosis not present

## 2014-06-21 DIAGNOSIS — R0602 Shortness of breath: Secondary | ICD-10-CM

## 2014-06-21 DIAGNOSIS — I350 Nonrheumatic aortic (valve) stenosis: Secondary | ICD-10-CM | POA: Diagnosis not present

## 2014-06-21 DIAGNOSIS — R262 Difficulty in walking, not elsewhere classified: Secondary | ICD-10-CM | POA: Insufficient documentation

## 2014-06-21 DIAGNOSIS — I2510 Atherosclerotic heart disease of native coronary artery without angina pectoris: Secondary | ICD-10-CM

## 2014-06-21 DIAGNOSIS — R293 Abnormal posture: Secondary | ICD-10-CM | POA: Diagnosis not present

## 2014-06-21 DIAGNOSIS — N189 Chronic kidney disease, unspecified: Secondary | ICD-10-CM | POA: Insufficient documentation

## 2014-06-21 NOTE — Therapy (Signed)
[place], [place], [address] Phone: [phone]   Fax:  [phone]  Physical Therapy Evaluation  Patient Details  Name: Eric Buchanan MRN: [id number] Date of Birth: 04-24-1925 Referring Provider:  Darylene Price  Encounter Date: 06/21/2014      PT End of Session - 06/21/14 1111    Visit Number 1   PT Start Time 1101   PT Stop Time 1150   PT Time Calculation (min) 49 min      Past Medical History  Diagnosis Date  . Hypertension   . Asthma   . Gout   . Hyperlipidemia   . Chronic kidney disease   . Arthritis   . Aortic stenosis   . CAD (coronary artery disease)   . Stroke     1965  . Inguinal hernia, right 05/15/2013  . COPD (chronic obstructive pulmonary disease) 10/11/2011  . Chronic kidney disease (CKD)   . Coronary artery disease involving native coronary artery   . Coronary artery disease involving coronary bypass graft 05/10/2014  . Zenker's diverticulum 05/21/2014  . Chronic combined systolic and diastolic congestive heart failure   . Mitral regurgitation   . S/P CABG x 2 01/06/1997    LIMA to LAD, SVG to OM, open vein harvest right lower leg - [facility]    Past Surgical History  Procedure Laterality Date  . Left and right heart catheterization with coronary/graft angiogram N/A 05/10/2014    Procedure: LEFT AND RIGHT HEART CATHETERIZATION WITH Beatrix Fetters;  Surgeon: Sherren Mocha, MD;  Location: [facility] CATH LAB;  Service: Cardiovascular;  Laterality: N/A;  . Cardiac catheterization    . Coronary artery bypass graft  01/06/1997    LIMA to LAD, SVG to OM, open SVG harvest right lower leg - [facility]  . Tee without cardioversion N/A 05/17/2014    Procedure: TRANSESOPHAGEAL ECHOCARDIOGRAM (TEE);  Surgeon: Lelon Perla, MD;  Location: [facility] ENDOSCOPY;  Service: Cardiovascular;  Laterality: N/A;    There were no vitals filed for this  visit.  Visit Diagnosis:  Difficulty walking - Plan: PT PLAN OF CARE CERT/RE-CERT  Abnormal posture - Plan: PT PLAN OF CARE CERT/RE-CERT  Shortness of breath - Plan: PT PLAN OF CARE CERT/RE-CERT      Subjective Assessment - 06/21/14 1106    Subjective Pt reports SOB, intermittent chest pain 1-2 minutes, eases with rest, however reports yesterday had chest pain which began when walking around the house which lasted 21 minutes. Pt reports he did not go to hosptial because he did not want to be admitted.    Patient Stated Goals get better   Currently in Pain? Yes   Pain Score 5    Pain Location Neck   Pain Orientation Posterior   Pain Descriptors / Indicators Aching   Pain Type Chronic pain   Pain Onset More than a month ago   Pain Frequency Constant   Aggravating Factors  holding head up   Pain Relieving Factors pain patches. PT to monitor pain but no pain goal to follow secondary to outside scope of this referral.            [facility] PT Assessment - 06/21/14 0001    Assessment   Medical Diagnosis severe aortic stneosis   Onset Date 03/23/14  approximate   Precautions   Precautions None   Restrictions   Weight Bearing Restrictions No   Balance Screen   Has the patient fallen in the past  6 months No   Has the patient had a decrease in activity level because of a fear of falling?  No   Is the patient reluctant to leave their home because of a fear of falling?  No   Home Environment   Living Enviornment Private residence   Home Access Stairs to enter   Entrance Stairs-Number of Steps 4   Entrance Stairs-Rails [place] One level   Prior Function   Level of Independence Independent with homemaking with ambulation  able to do yardwork prior to do three months ago   Posture/Postural Control   Posture/Postural Control Postural limitations   Postural Limitations Rounded Shoulders;Forward head   ROM / Strength   AROM / PROM / Strength AROM;Strength   AROM    Overall AROM  Within functional limits for tasks performed   Strength   Overall Strength Comments bil UEs grossly 4/5, lower extremities grossly 5/5   Strength Assessment Site Hand   Right/Left hand Right;Left   Grip (lbs) R 40 lbs   Grip (lbs) L 46 lbs   Ambulation/Gait   Ambulation/Gait Yes   Ambulation/Gait Assistance 6: Modified independent (Device/Increase time)   Gait Pattern Decreased step length - right;Decreased step length - left;Narrow base of support  reaches for UE support   Gait Comments With fatigue, patient begins to require UE support or CGA/min A due to increasing unsteadiness.          [facility] Pre-Surgical Assessment - 06/21/14 0001    5 Meter Walk Test- trial 1 8 sec  HR to 90 bpm with 3 reps of 5 meter walk   5 Meter Walk Test- trial 2 10 sec.    5 Meter Walk Test- trial 3 10 sec.  >/= 6 sec indicates slow speed   5 meter walk test average 9.33 sec   Timed Up & Go Test trial  22 sec.  HR to 88 bpm with this test alone   Comments >/= 12 sec indicates increased fall risk. Pt demonstrates difficulty and hesitancy with 180 degree turn as well as moderate increase SOB with this short distance.     4 Stage Balance Test tolerated for:  10 sec.   4 Stage Balance Test Position 2   comment inability to hold position 3 for 10 seconds indicates increased fall risk   Sit To Stand Test- trial 1 29 sec.   Comment c/o knee pain and demonstrates SOB   ADL/IADL Independent with: Bathing;Meal prep;Dressing;Finances   ADL/IADL Needs Assistance with: Yard work   ADL/IADL Fraility Index Vulnerable   6 Minute Walk- Baseline yes   BP (mmHg) 102/62 mmHg   HR (bpm) 54   02 Sat (%RA) 99 %   Modified Borg Scale for Dyspnea 2- Mild shortness of breath   Perceived Rate of Exertion (Borg) 9- very light   6 Minute Walk Post Test yes   BP (mmHg) 150/70 mmHg   HR (bpm) 103   02 Sat (%RA) 99 %   Modified Borg Scale for Dyspnea 5- Strong or hard breathing   Perceived Rate of Exertion  (Borg) 15- Hard   Aerobic Endurance Distance Walked 460   Endurance additional comments Ambulated continuously x 5 minutes and then rested for last minute due to increase in SOB. Pt denied chest pain throughout or post testing.                         PT Education -  2014/07/05 1453    Education provided Yes   Education Details fall risk, activity modification   Person(s) Educated Patient   Methods Explanation   Comprehension Verbalized understanding                    Plan - Jul 05, 2014 1454    Clinical Impression Statement Pt is an 81 yo male presenting to OP PT for evaluation prior to possible TAVR surgery due to severe aortic stenosis. Pt's primary c/o is SOB which he really began to notice about 3 months ago. Pt also desribes intermittent chest pain although he denied any today. Pt's HR and BP significantly increased with physical activity during today's evaluation. Pt is at increased fall risk per the 4 stage balance test and the TImed up and go. He was severly limited with the 6 minute walk test and was only able to ambulate 460' during that period with a significant increase in SOB.  Prior to SOB about 3 months ago, pt was independent and able to do all yardwork. He is currently unable to do yardwork and is also limited with basic household activities due to SOB.   PT Frequency One time visit   Consulted and Agree with Plan of Care Patient          G-Codes - 07-05-2014 1458    Functional Assessment Tool Used 6 minute walk 460'   Functional Limitation Mobility: Walking and moving around   Mobility: Walking and Moving Around Current Status [phone]) At least 60 percent but less than 80 percent impaired, limited or restricted   Mobility: Walking and Moving Around Goal Status [phone]) At least 60 percent but less than 80 percent impaired, limited or restricted   Mobility: Walking and Moving Around Discharge Status [phone]) At least 60 percent but less than 80 percent  impaired, limited or restricted       Problem List Patient Active Problem List   Diagnosis Date Noted  . Chronic kidney disease 07/05/14  . Chronic kidney disease (CKD)   . Coronary artery disease involving native coronary artery   . Chronic combined systolic and diastolic congestive heart failure   . Mitral regurgitation   . Zenker's diverticulum 05/21/2014  . Coronary artery disease involving coronary bypass graft 05/10/2014  . Chest pain 04/28/2014  . Laceration of forearm, left, complicated 17/51/0258  . Allergic reaction 11/19/2013  . Arthritis 10/16/2013  . Airway hyperreactivity 10/16/2013  . Cerebral vascular accident 10/16/2013  . Chronic kidney failure 10/16/2013  . Fatigue 09/11/2013  . Inguinal hernia, right 05/15/2013  . Senile purpura 05/15/2013  . Benign skin lesion of forearm 02/06/2013  . LPRD (laryngopharyngeal reflux disease) 11/14/2012  . Nocturia 08/18/2012  . Osteoarthritis of left knee 08/18/2012  . Gout 10/11/2011  . Renal insufficiency 10/11/2011  . Cervical radiculitis right sided C7 10/11/2011  . COPD (chronic obstructive pulmonary disease) 10/11/2011  . Hyperlipidemia 10/11/2011  . Hypertension 10/11/2011  . Aortic stenosis   . H/O coronary artery bypass surgery 04/20/2011  . MI (mitral incompetence) 04/20/2011  . Arteriosclerosis of coronary artery 03/17/2010  . Benign essential HTN 03/17/2010  . S/P CABG x 2 01/06/1997    NICOLETTA,DANA, PT 2014/07/05, 3:02 PM  [facility] [address] [place], [place], [address] Phone: [phone]   Fax:  [phone]

## 2014-06-21 NOTE — Patient Instructions (Addendum)
Patient should not drive an automobile until after he has recovered from surgery  Patient should call EMS or go directly to Emergency if he develops prolonged episode of chest pain that will not resolve on it's own  Patient should continue taking all medications without change through the day before surgery.  Patient should have nothing to eat or drink after midnight the night before surgery.  On the morning of surgery patient should take only Nexium with a sip of water.

## 2014-06-21 NOTE — Progress Notes (Addendum)
[facility]       CARDIOTHORACIC SURGERY NOTE  Referring Provider is Stanford Breed, Denice Bors, MD PCP is Aundria Mems, MD   HPI:  Patient returns to the office today for follow-up of stage D severe symptomatic aortic stenosis with severe three-vessel coronary artery disease and vein graft disease, status post coronary artery bypass grafting in the remote past. He was originally seen in consultation on 06/02/2014. The patient presents with progressive symptoms of exertional shortness of breath and fatigue consistent with chronic combined systolic and diastolic congestive heart failure, currently New York Heart Association functional class 3-4 year the patient has also been having intermittent episodes of substernal chest pain consistent with angina pectoris. Transthoracic echocardiogram confirms presence of severe aortic stenosis and also documents the presence of moderate mitral regurgitation.  An attempt at transesophageal echocardiogram was aborted because of problems with attempts to cannulate the esophagus. 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  Cardiac catheterization confirms the presence of severe three-vessel coronary artery disease with continued patency of both bypass grafts placed at the time of the patient's original surgery in 1998. The patient does have some progression of distal coronary artery disease in all 3 major vascular territories, including 80% stenosis of the mid right coronary artery. Since the patient was originally seen in consultation he has undergone CT angiography and formal pulmonary function testing. He returns to the office today to discuss the results of these tests and contemplate treatment options further. Over the past 2 weeks he has been seen on 2 occasions by his primary care physician. A CT chest was performed to r/o PE and lasix was prescribed for  congestive heart failure.  His breathing has improved a little bit with oral diuretic therapy and his weight has come down more than 6 pounds. The patient has had a persistent nonproductive cough. He was prescribed a short course of azithromycin last week.  However, the patient continues to experience shortness of breath with very mild activity and he cannot lay flat in bed. He has also had some intermittent chest pains that have been for the most part associated with activity and probably relieved by rest. He did have one episode of chest pain yesterday that developed while he was reading the paper in the morning and lasted for nearly 15 minutes.   Current Outpatient Prescriptions  Medication Sig Dispense Refill  . albuterol (PROVENTIL HFA;VENTOLIN HFA) 108 (90 BASE) MCG/ACT inhaler Inhale 1 puff into the lungs every 4 (four) hours as needed for shortness of breath.     . allopurinol (ZYLOPRIM) 100 MG tablet Take 1 tablet (100 mg total) by mouth daily. 90 tablet 3  . aspirin 81 MG tablet Take 81 mg by mouth at bedtime.     Marland Kitchen atorvastatin (LIPITOR) 40 MG tablet Take 1 tablet (40 mg total) by mouth daily. 90 tablet 3  . azithromycin (ZITHROMAX Z-PAK) 250 MG tablet Take 2 tablets (500 mg) on  Day 1,  followed by 1 tablet (250 mg) once daily on Days 2 through 5. 6 tablet 0  . budesonide-formoterol (SYMBICORT) 160-4.5 MCG/ACT inhaler Inhale 2 puffs into the lungs 2 (two) times daily. 1 Inhaler 0  . Capsaicin-Menthol (SALONPAS GEL) 0.025-1.25 % PTCH Apply 1 patch topically 3 (three) times daily.    . Cholecalciferol (VITAMIN D) 2000 UNITS tablet Take 2,000 Units by mouth.    . esomeprazole (NEXIUM) 40 MG capsule Take 1  capsule (40 mg total) by mouth daily at 12 noon. 90 capsule 3  . fluticasone (FLONASE) 50 MCG/ACT nasal spray One spray in each nostril twice a day, use left hand for right nostril, and right hand for left nostril. 48 g 0  . furosemide (LASIX) 40 MG tablet Take 1 tablet (40 mg total) by  mouth daily. 5 tablet 0  . Glucosamine Sulfate-MSM 500-400 MG CAPS Take by mouth 3 (three) times daily.    Marland Kitchen lisinopril-hydrochlorothiazide (PRINZIDE,ZESTORETIC) 20-25 MG per tablet Take 0.5 tablets by mouth daily. 90 tablet 3  . oxyCODONE-acetaminophen (PERCOCET/ROXICET) 5-325 MG per tablet Take 0.5 tablets by mouth every 8 (eight) hours as needed. 45 tablet 0  . Tiotropium Bromide Monohydrate (SPIRIVA RESPIMAT) 2.5 MCG/ACT AERS Inhale 2.5 mg into the lungs every morning.     No current facility-administered medications for this visit.      Physical Exam:   BP 134/73 mmHg  Pulse 82  Resp 21  Ht 5\' 5"  (1.651 m)  Wt 141 lb (63.957 kg)  BMI 23.46 kg/m2  SpO2 98%  General:  Elderly and somewhat frail  Chest:   Fairly clear w/ a few scattered crackles, symmetrical breath sounds  CV:   RRR w/ systolic murmur best along sternal border  Incisions:  n/a  Abdomen:  Soft, non-distended, non-tender  Extremities:  Warm, well-perfused, trivial LE edema   Diagnostic Tests:  Cardiac TAVR CT  TECHNIQUE: The patient was scanned on a Philips 256 scanner. A 120 kV retrospective scan was triggered in the descending thoracic aorta at 111 HU's. Gantry rotation speed was 270 msecs and collimation was .9 mm. No beta blockade or nitro were given. The 3D data set was reconstructed in 5% intervals of the R-R cycle. Systolic and diastolic phases were analyzed on a dedicated work station using MPR, MIP and VRT modes. The patient received 80 cc of contrast.  FINDINGS: Aortic Valve: Trileaflet and heavily calcified. Bulky annular calcification at the base of the left leaflet and smaller amount of nodular calcification at the base of the non coronary leaflet  Sinotubular Junction: 24 mm  Ascending Thoracic Aorta: 29 mm  Aortic Arch: Not well seen on cardiac portion of exam  Descending Thoracic Aorta: 28 mm  Sinus of Valsalva Measurements:  Non-coronary: 31 mm  Right -coronary:  27 mm  Left -coronary: 29 mm  Coronary Artery Height above Annulus:  Left Main: 13.2 mm (but supplied by LIMA and SVG to OM  Right Coronary: 12.7 mm  Virtual Basal Annulus Measurements:  Maximum/Minimum Diameter: 26.5 mm x 20.7 mm  Perimeter: 75 mm  Area: 419 mm2  Coronary Arteries: Sufficient height above annulus for deployment Patent LIMA to LAD and patent SVG to OM2  Optimum Fluoroscopic Angle for Delivery: Essentially AP LAO 1 degree Cranial 0 degrees  IMPRESSION: 1) Calcified tri leaflet aortic valve suitable for 23 mm Sapien 3 valve Annular area 419 mm2  2) Significant bulky annular calcification at base of left cusp and less so at base of non coronary cusp that could increase risk of perivalvular regurgitation post deployment  3) Suitable coronary height above annulus for delivery although left system supplied by LIMA and SVG to OM  4) Optimum angiographic angle for delivery essentially AP (LAO 1 degree Cranial 0 degrees)  Jenkins Rouge   Electronically Signed  By: Jenkins Rouge M.D.  On: 06/08/2014 16:06      Study Result     EXAM: OVER-READ INTERPRETATION CT CHEST  The following report  is an over-read performed by radiologist Dr. Laurence Ferrari [facility] on 06/08/2014. This over-read does not include interpretation of cardiac or coronary anatomy or pathology. The coronary CTA interpretation by the cardiologist is attached.  COMPARISON: None.  FINDINGS: Secondary pulmonary lobular interstitial accentuation noted with faint ground-glass opacities scattered in both lungs. Bilateral airway thickening especially in the lower lobes.  Prior CABG. Small type 1 hiatal hernia.  IMPRESSION: 1. Scattered ground-glass opacities and secondary pulmonary lobular interstitial accentuation favor pulmonary edema. 2. Bilateral airway thickening, particularly in the lower lobes, possibly from edema or  inflammation. 3. Small type 1 hiatal hernia  Electronically Signed: By: Van Clines M.D. On: 06/08/2014 10:16     CT ANGIOGRAPHY CHEST WITH CONTRAST  TECHNIQUE: Multidetector CT imaging of the chest was performed using the standard protocol during bolus administration of intravenous contrast. Multiplanar CT image reconstructions and MIPs were obtained to evaluate the vascular anatomy.  CONTRAST: 65mL OMNIPAQUE IOHEXOL 350 MG/ML SOLN  COMPARISON: Coronary CTA performed 06/08/2014  FINDINGS: CT CHEST FINDINGS  Mediastinum/Nodes: No filling defects in the pulmonary arteries to suggest pulmonary emboli. Scattered small and borderline sized mediastinal lymph nodes. Left paratracheal/ AP window lymph node measures 11 mm subcarinal lymph node has a short axis diameter of 12 mm. Partially calcified right hilar lymph nodes have a short axis diameter of 9 mm.  Prior CABG. Heart is borderline in size. Aorta is normal caliber.  Lungs/Pleura: Trace bilateral pleural effusions. No confluent airspace opacities. Small subpleural nodule in the peripheral lower right upper lobe on image 47 measuring 5 mm. Scarring noted in the bases, left greater than right.  Musculoskeletal: Degenerative changes in the thoracic spine.  Upper abdomen: Imaging into the upper abdomen shows no acute findings.  Review of the MIP images confirms the above findings.  IMPRESSION: No evidence of pulmonary embolus.  Borderline sized mediastinal and right hilar lymph nodes, presumably reactive.  Trace bilateral pleural effusions. No confluent airspace opacity. Scarring in the lung bases.   Electronically Signed  By: Rolm Baptise M.D.  On: 06/11/2014 11:48   CT ANGIOGRAPHY CHEST, ABDOMEN AND PELVIS  TECHNIQUE: Multidetector CT imaging through the chest, abdomen and pelvis was performed using the standard protocol during bolus administration of intravenous contrast.  Multiplanar reconstructed images and MIPs were obtained and reviewed to evaluate the vascular anatomy.  CONTRAST: 41mL OMNIPAQUE IOHEXOL 350 MG/ML SOLN  COMPARISON: Chest CT 06/08/2014.  FINDINGS: CTA CHEST FINDINGS  Mediastinum/Lymph Nodes: Heart size is mildly enlarged. The apex of the left ventricle in is present immediately deep to the anterior left fifth/sixth intercostal space. There is no significant pericardial fluid, thickening or pericardial calcification. Severe thickening calcification of the aortic valve. There is atherosclerosis of the thoracic aorta, the great vessels of the mediastinum and the coronary arteries, including calcified atherosclerotic plaque in the left main, left anterior descending, left circumflex and right coronary arteries. Status post median sternotomy for CABG, including LIMA to the LAD. Multiple borderline enlarged mediastinal lymph nodes are nonspecific. Small hiatal hernia. No axillary lymphadenopathy.  Lungs/Pleura: A few scattered tiny pulmonary nodules are noted in the lungs bilaterally, largest of which measures only 5 mm in the left upper lobe (image 25 of series 403). No larger more suspicious appearing pulmonary nodules or masses are noted. No acute consolidative airspace disease. No pleural effusions.  Musculoskeletal/Soft Tissues: Median sternotomy wires. There are no aggressive appearing lytic or blastic lesions noted in the visualized portions of the skeleton.  CTA ABDOMEN AND PELVIS FINDINGS  Hepatobiliary: No cystic or solid hepatic lesions. No intra or extrahepatic biliary ductal dilatation. Tiny 2 mm calcified gallstone lying dependently in the neck of the gallbladder. Gallbladder is otherwise normal in appearance.  Pancreas: No pancreatic mass. No pancreatic ductal dilatation. No pancreatic or peripancreatic fluid or inflammatory changes.  Spleen: Unremarkable.  Adrenals/Urinary Tract: Bilateral adrenal  glands are normal in appearance. Mild cortical thinning in the kidneys bilaterally. The appearance of the kidneys is otherwise normal. No hydroureteronephrosis. Urinary bladder is normal in appearance.  Stomach/Bowel: The appearance of the stomach is normal. No pathologic dilatation of small bowel or colon. Several colonic diverticulae are noted, particularly in the sigmoid colon, without surrounding inflammatory changes to suggest an acute diverticulitis at this time. Small right inguinal hernia containing several loops of distal small bowel, without evidence of incarceration or obstruction at this time.  Vascular/Lymphatic: Vascular findings and measurements pertinent to potential TAVR procedure, as detailed below. In addition, there are ulcerated plaques in the infrarenal abdominal aorta (image 168 of series 401), and in the left common iliac artery (image 203 of series 401). There is also fusiform ectasia of the infrarenal abdominal aorta measuring up to 2.5 x 2.4 cm (image 179 of series 401). Stenosis at the ostium of the right renal artery appears potentially hemodynamically significant. Single renal arteries bilaterally. There also appears to be a potentially hemodynamically significant stenosis at the ostium of the inferior mesenteric artery. Celiac axis and SMA are widely patent. No lymphadenopathy noted in the abdomen or pelvis.  Reproductive: Prostate gland appears mildly enlarged measuring 5.6 x 4.1 cm. Seminal vesicles are unremarkable in appearance.  Other: No significant volume of ascites. No pneumoperitoneum.  Musculoskeletal: There are no aggressive appearing lytic or blastic lesions noted in the visualized portions of the skeleton.  VASCULAR MEASUREMENTS PERTINENT TO TAVR:  AORTA:  Minimal Aortic Diameter - 12 x 15 mm  Severity of Aortic Calcification - moderate to severe  RIGHT PELVIS:  Right Common Iliac Artery -  Minimal Diameter - 7.1  x 4.2 mm  Tortuosity - mild  Calcification - severe  Right External Iliac Artery -  Minimal Diameter - 5.2 x 4.6 mm  Tortuosity - mild  Calcification - moderate  Right Common Femoral Artery -  Minimal Diameter - 5.8 x 4.4 mm  Tortuosity - mild  Calcification - moderate  LEFT PELVIS:  Left Common Iliac Artery -  Minimal Diameter - 4.7 x 3.7 mm  Tortuosity - mild  Calcification - moderate  Left External Iliac Artery -  Minimal Diameter - 5.0 x 4.8 mm  Tortuosity - mild  Calcification - moderate  Left Common Femoral Artery -  Minimal Diameter - 4.7 x 4.2 mm  Tortuosity - mild  Calcification - moderate  Review of the MIP images confirms the above findings.  IMPRESSION: 1. Vascular findings and measurements pertinent to potential TAVR procedure, as detailed above. This patient does not appear to have suitable pelvic arterial access for TAVR. 2. The apex of the left ventricle is immediately deep to the anterior aspect of the left fifth/sixth intercostal space. 3. A few scattered tiny pulmonary nodules are nonspecific, largest of which measures only 5 mm in the left upper lobe. If the patient is at high risk for bronchogenic carcinoma, follow-up chest CT at 6-12 months is recommended. If the patient is at low risk for bronchogenic carcinoma, follow-up chest CT at 12 months is recommended. This recommendation follows the consensus statement: Guidelines for Management of Small Pulmonary Nodules Detected  on CT Scans: A Statement from the [facility] as published in Radiology 2005;237:395-400. 4. Colonic diverticulosis without findings to suggest acute diverticulitis at this time. 5. Cholelithiasis without evidence of acute cholecystitis at this time. 6. Additional incidental findings, as above.   Electronically Signed  By: Vinnie Langton M.D.  On: 06/16/2014 13:15   Pulmonary Function  Tests  Baseline      Post-bronchodilator  FVC  2.33 L  (79% predicted) FVC  2.35 L  (80% predicted) FEV1  1.85 L  (93% predicted) FEV1  1.97 L  (99% predicted) FEF25-75 1.79 L  (149% predicted) FEF25-75 2.35 L  (196% predicted)  TLC  4.58 L  (75% predicted) RV  2.18 L  (86% predicted) DLCO  38% predicted     Impression:  Patient has stage D severe symptomatic aortic stenosis with severe three-vessel coronary artery disease and vein graft disease status post coronary artery bypass grafting in the remote past. The patient also has at least moderate mitral regurgitation. He presents with progressive symptoms of exertional shortness of breath and fatigue consistent with chronic combined systolic and diastolic congestive heart failure, currently New York Heart Association functional class III-VI. He is also having chest pain c/w unstable angina pectoris.  I have personally reviewed the patient's recent transthoracic echocardiogram and diagnostic cardiac catheterization. The patient has classical findings of severe calcific aortic stenosis with severe thickening, calcification, and restricted leaflet mobility involving all 3 leaflets of the aortic valve. Peak velocity across the aortic valve measured in excess of 4.5 m/s corresponding to a mean transvalvular gradient of 44 mmHg. There is at least moderate mitral regurgitation, although pulmonary artery pressures were only mildly elevated and there were not large v-waves noted at the time of right heart catheterization. By transthoracic echocardiogram the mitral regurgitation is central and directed slightly posteriorly. It may be primarily related to type I dysfunction, although there may be some component of both type IIIA and type IIIB dysfunction as well. There is some thickening of both leaflets of the mitral valve and there may be significant annular calcification. Quantification of the severity of mitral regurgitation was notable for the  presence of an ERO measured 0.15 cm with a calculated regurgitant volume of 28 mL. An attempt at transesophageal echocardiogram to sort this out further was aborted because of difficulty with intubation of the esophagus from cricopharyngeus muscle spasm, and the patient has subsequently been found to have a Zenker's diverticulum. Coronary arteriography demonstrates the presence of severe three-vessel coronary artery disease with continued patency of both bypass grafts placed at the time of surgery in 1998 including a patent left internal mammary artery to the distal left anterior descending coronary artery and a patent vein graft to a large obtuse marginal branch. There is some progression of distal disease in all vascular territories, including 80% stenosis of the mid right coronary artery and diffuse high grade stenosis of the posterior descending coronary artery.  Risks associated with conventional surgery would be extremely high. I would not consider this elderly gentleman a candidate for redo coronary artery bypass grafting, aortic valve replacement, and mitral valve repair or replacement. Options include long-term palliative medical therapy versus a targeted less invasive approach including TAVR. The patient's coronary artery disease could potentially be treated using PCI, although at this point the patient's primary symptomatology relates to his valvular disease and congestive heart failure.   I have personally reviewed the patient's recent CT angiograms and pulmonary function tests. Cardiac gated CT angiogram of the  heart demonstrates findings consistent with severe aortic stenosis. The patient appears to have anatomical characteristics suitable for transcatheter aortic valve replacement. There is a fair amount of calcification in the aortic annulus that could slightly increase risk of paravalvular leak. However, there are no other significant complicating features. CT angiogram of the abdomen and  pelvis demonstrates the presence of borderline pelvic vascular access with a small infrarenal abdominal aortic aneurysm and diffuse atherotic atherosclerotic disease with high-grade stenosis of the left common iliac artery and moderate stenosis of the right common iliac artery. The patient appears to have adequate lumen size for possible transfemoral approach on the right side but not on the left.  However, there is diffuse disease with long segment calcification of the right common iliac artery, and a small localized dissection or ulcerated plaque.   Plan:   The patient and his daughter were again counseled at length regarding treatment alternatives for management of severe symptomatic aortic stenosis. Alternative approaches such as conventional aortic valve replacement, transcatheter aortic valve replacement, and palliative medical therapy were compared and contrasted at length.  The risks associated with conventional surgery were discussed in detail, as were the reasons why I feel this patient should not be considered a candidate for open surgery.  This discussion was placed in the context of the patient's own specific clinical presentation and past medical history.  All of their questions been addressed.  The patient hopes to proceed with transcatheter aortic valve replacement as soon as possible. We tentatively plan to proceed on 07/02/2014.  We will review the patient's CT angiograms with a multidisciplinary team as specialists and consider whether or not it seems reasonable to attempt a transfemoral approach on the right side. Alternatively, we will plan to use a transapical approach.  Given the patient's somewhat tenuous hemodynamic status with the presence of significant mitral regurgitation, the use of a trans-apical approach might be associated with increased risk of hemodynamic instability and/or the need for cardiopulmonary bypass support.   Following the decision to proceed with transcatheter  aortic valve replacement, a discussion has been held regarding what types of management strategies would be attempted intraoperatively in the event of life-threatening complications, including whether or not the patient would be considered a candidate for the use of cardiopulmonary bypass and/or conversion to open sternotomy for attempted surgical intervention.  Whereas we would potentially use cardioplegia bypass for short-term support with femoral cannulation, the patient would not be considered a candidate for emergency median sternotomy.  The patient has been advised of a variety of complications that might develop including but not limited to risks of death, stroke, paravalvular leak, aortic dissection or other major vascular complications, aortic annulus rupture, device embolization, cardiac rupture or perforation, mitral regurgitation, acute myocardial infarction, arrhythmia, heart block or bradycardia requiring permanent pacemaker placement, congestive heart failure, respiratory failure, renal failure, pneumonia, infection, other late complications related to structural valve deterioration or migration, or other complications that might ultimately cause a temporary or permanent loss of functional independence or other long term morbidity.  The patient provides full informed consent for the procedure as described and all questions were answered.  During the interim period of time the patient has been advised that he should call EMS or present to the ED if his breathing deteriorates any further or if he develops any prolonged episodes of chest pain unrelieved by rest.   I spent in excess of 30 minutes during the conduct of this office consultation and >50% of this time involved  direct face-to-face encounter with the patient for counseling and/or coordination of their care.    Valentina Gu. Roxy Manns, MD 06/21/2014 1:13 PM

## 2014-06-22 ENCOUNTER — Other Ambulatory Visit: Payer: Self-pay | Admitting: *Deleted

## 2014-06-22 DIAGNOSIS — I350 Nonrheumatic aortic (valve) stenosis: Secondary | ICD-10-CM

## 2014-06-25 ENCOUNTER — Telehealth: Payer: Self-pay

## 2014-06-25 DIAGNOSIS — M5412 Radiculopathy, cervical region: Secondary | ICD-10-CM

## 2014-06-25 NOTE — Telephone Encounter (Signed)
Danyelle from GI called requested another order be put in for patient to have epidural done the order that was placed in October has expired. Patient is already scheduled for this coming up Monday  [facility]

## 2014-06-25 NOTE — Telephone Encounter (Signed)
Orders placed.

## 2014-06-28 ENCOUNTER — Ambulatory Visit
Admission: RE | Admit: 2014-06-28 | Discharge: 2014-06-28 | Disposition: A | Discharge status: Home / Self Care | Payer: Medicare Other | Source: Ambulatory Visit | Attending: Sports Medicine | Admitting: Sports Medicine

## 2014-06-28 MED ORDER — IOHEXOL 300 MG/ML  SOLN
1.0000 mL | Freq: Once | INTRAMUSCULAR | Status: AC | PRN
Start: 2014-06-28 — End: 2014-06-28

## 2014-06-28 MED ORDER — TRIAMCINOLONE ACETONIDE 40 MG/ML IJ SUSP (RADIOLOGY): 60.0000 mg | Freq: Once | INTRAMUSCULAR | Status: DC

## 2014-06-30 ENCOUNTER — Encounter (HOSPITAL_COMMUNITY)
Admission: RE | Admit: 2014-06-30 | Discharge: 2014-06-30 | Disposition: A | Discharge status: Home / Self Care | Payer: Medicare Other | Source: Ambulatory Visit | Attending: Cardiovascular Disease | Admitting: Cardiovascular Disease

## 2014-06-30 ENCOUNTER — Institutional Professional Consult (permissible substitution) (INDEPENDENT_AMBULATORY_CARE_PROVIDER_SITE_OTHER): Payer: Medicare Other | Admitting: Surgery

## 2014-06-30 ENCOUNTER — Encounter: Payer: Self-pay | Admitting: Surgery

## 2014-06-30 ENCOUNTER — Encounter (HOSPITAL_COMMUNITY): Payer: Self-pay

## 2014-06-30 VITALS — BP 140/72 | HR 78 | Resp 20 | Ht 65.0 in | Wt 141.0 lb

## 2014-06-30 VITALS — BP 118/63 | HR 57 | Temp 98.1°F | Resp 20 | Ht 65.0 in | Wt 141.8 lb

## 2014-06-30 DIAGNOSIS — I5042 Chronic combined systolic (congestive) and diastolic (congestive) heart failure: Secondary | ICD-10-CM

## 2014-06-30 DIAGNOSIS — Z951 Presence of aortocoronary bypass graft: Secondary | ICD-10-CM

## 2014-06-30 DIAGNOSIS — I10 Essential (primary) hypertension: Secondary | ICD-10-CM

## 2014-06-30 DIAGNOSIS — I2510 Atherosclerotic heart disease of native coronary artery without angina pectoris: Secondary | ICD-10-CM | POA: Insufficient documentation

## 2014-06-30 DIAGNOSIS — I350 Nonrheumatic aortic (valve) stenosis: Secondary | ICD-10-CM | POA: Diagnosis not present

## 2014-06-30 DIAGNOSIS — I340 Nonrheumatic mitral (valve) insufficiency: Secondary | ICD-10-CM | POA: Diagnosis not present

## 2014-06-30 DIAGNOSIS — Z01812 Encounter for preprocedural laboratory examination: Secondary | ICD-10-CM | POA: Insufficient documentation

## 2014-06-30 DIAGNOSIS — Z01810 Encounter for preprocedural cardiovascular examination: Secondary | ICD-10-CM | POA: Insufficient documentation

## 2014-06-30 DIAGNOSIS — E785 Hyperlipidemia, unspecified: Secondary | ICD-10-CM | POA: Insufficient documentation

## 2014-06-30 DIAGNOSIS — Z01818 Encounter for other preprocedural examination: Secondary | ICD-10-CM | POA: Insufficient documentation

## 2014-06-30 DIAGNOSIS — I25110 Atherosclerotic heart disease of native coronary artery with unstable angina pectoris: Secondary | ICD-10-CM

## 2014-06-30 HISTORY — DX: Gastro-esophageal reflux disease without esophagitis: K21.9

## 2014-06-30 HISTORY — DX: Reserved for inherently not codable concepts without codable children: IMO0001

## 2014-06-30 HISTORY — DX: Angina pectoris, unspecified: I20.9

## 2014-06-30 LAB — APTT: aPTT: 31 seconds (ref 24–37)

## 2014-06-30 LAB — COMPREHENSIVE METABOLIC PANEL
ALBUMIN: 3.7 g/dL (ref 3.5–5.0)
ALT: 20 U/L (ref 17–63)
ANION GAP: 11 (ref 5–15)
AST: 22 U/L (ref 15–41)
Alkaline Phosphatase: 115 U/L (ref 38–126)
BUN: 21 mg/dL — ABNORMAL HIGH (ref 6–20)
CO2: 18 mmol/L — AB (ref 22–32)
CREATININE: 1.34 mg/dL — AB (ref 0.61–1.24)
Calcium: 9.4 mg/dL (ref 8.9–10.3)
Chloride: 99 mmol/L — ABNORMAL LOW (ref 101–111)
GFR calc non Af Amer: 46 mL/min — ABNORMAL LOW (ref >60)
GFR, EST AFRICAN AMERICAN: 54 mL/min — AB (ref >60)
GLUCOSE: 99 mg/dL (ref 65–99)
POTASSIUM: 4.5 mmol/L (ref 3.5–5.1)
Sodium: 128 mmol/L — ABNORMAL LOW (ref 135–145)
Total Bilirubin: 0.9 mg/dL (ref 0.3–1.2)
Total Protein: 6.3 g/dL — ABNORMAL LOW (ref 6.5–8.1)

## 2014-06-30 LAB — BLOOD GAS, ARTERIAL
Acid-base deficit: 2.7 mmol/L — ABNORMAL HIGH (ref 0.0–2.0)
Bicarbonate: 20.6 mEq/L (ref 20.0–24.0)
DRAWN BY: 206361
FIO2: 0.21 %
O2 Saturation: 97.9 %
PCO2 ART: 29.6 mmHg — AB (ref 35.0–45.0)
PH ART: 7.456 — AB (ref 7.350–7.450)
Patient temperature: 98.6
TCO2: 21.5 mmol/L (ref 0–100)
pO2, Arterial: 95.7 mmHg (ref 80.0–100.0)

## 2014-06-30 LAB — URINALYSIS, ROUTINE W REFLEX MICROSCOPIC
Bilirubin Urine: NEGATIVE
Glucose, UA: NEGATIVE mg/dL
HGB URINE DIPSTICK: NEGATIVE
Ketones, ur: NEGATIVE mg/dL
Leukocytes, UA: NEGATIVE
Nitrite: NEGATIVE
Protein, ur: NEGATIVE mg/dL
Specific Gravity, Urine: 1.012 (ref 1.005–1.030)
UROBILINOGEN UA: 0.2 mg/dL (ref 0.0–1.0)
pH: 5.5 (ref 5.0–8.0)

## 2014-06-30 LAB — CBC
HCT: 33.9 % — ABNORMAL LOW (ref 39.0–52.0)
HEMOGLOBIN: 11.8 g/dL — AB (ref 13.0–17.0)
MCH: 31.4 pg (ref 26.0–34.0)
MCHC: 34.8 g/dL (ref 30.0–36.0)
MCV: 90.2 fL (ref 78.0–100.0)
Platelets: 248 10*3/uL (ref 150–400)
RBC: 3.76 MIL/uL — AB (ref 4.22–5.81)
RDW: 15.4 % (ref 11.5–15.5)
WBC: 6.4 10*3/uL (ref 4.0–10.5)

## 2014-06-30 LAB — PROTIME-INR
INR: 1.15 (ref 0.00–1.49)
Prothrombin Time: 14.9 seconds (ref 11.6–15.2)

## 2014-06-30 LAB — SURGICAL PCR SCREEN
MRSA, PCR: POSITIVE — AB
Staphylococcus aureus: POSITIVE — AB

## 2014-06-30 NOTE — Progress Notes (Signed)
RYAN FROM DR. OWEN'S OFFICE CALLED AND STATED SHE WOULD NOTIFIY DR. Roxy Manns.  CALLED [facility]UPIROCIN [facility] INTO [facility] IN [place] [phone].  SPOKE WITH PATIENT AND MADE HIM AWARE OF MUPIROCIN RX NEEDING TO BE PICKED UP AND START USING ASAP.  LEFT MESSAGE FOR PATIENT'S DAUGHTER THAT PATIENT NEEDED TO PICK UP AND BEGIN USING MUPIROCIN , USE Friday MORNING AND BRING TO HOSPITAL.

## 2014-06-30 NOTE — Progress Notes (Signed)
Patient ID: Eric Buchanan, male   DOB: Oct 26, 1925, 81 y.o.   MRN: [id number]   [facility] REPORT  Referring Provider is Stanford Breed, Denice Bors, MD PCP is Aundria Mems, MD  Chief Complaint  Patient presents with  . Aortic Stenosis    Further discuss TAVR scheduled for 07/02/14    HPI:   Patient is an 81 year old white male with known history of coronary artery disease status post coronary artery bypass grafting x 2 in 1998 at [facility], aortic stenosis, mitral regurgitation, hypertension, cerebrovascular disease with remote history of stroke in 1965, COPD, hyperlipidemia, and stage III chronic kidney disease who is being considered for TAVR for severe symptomatic aortic stenosis. The patient recently complained to his primary care physician about a one-year history of progressive symptoms of exertional shortness of breath and fatigue. He was noted to have a heart murmur on physical exam and underwent a transthoracic echocardiogram on 03/17/2014 that revealed severe aortic stenosis, moderate to severe mitral regurgitation, and a left ventricular  ejection fraction estimated 60-65%. The patient was subsequently referred to Dr. Stanford Breed for cardiology evaluation and  underwent left and right heart catheterization by Dr. Burt Knack on 05/10/2014. This revealed severe left main disease and three-vessel coronary artery disease involving the native coronary arteries with patent left internal mammary artery graft to the left anterior descending coronary artery and patent but diseased saphenous vein graft to the second obtuse marginal branch coronary artery. There was diffuse distal coronary artery disease. There was severe aortic stenosis with mean transvalvular gradient measured 25 mmHg at catheterization, corresponding to calculated aortic valve area of 0.8 cm. There was mild pulmonary hypertension with PA pressures  measured 45/21 and mean pulmonary capillary wedge pressure of 21 mmHg without large v-waves. Mean central venous pressure was 8. The patient was scheduled for transesophageal echocardiogram to further evaluate the severity of mitral regurgitation but the TEE probe could not be safely passed into the esophagus and the procedure was aborted. Subsequent  barium swallow revealed the presence of a  Zenker's diverticulum without esophageal obstruction.   The patient has been widowed for the past 2 years and lives alone in [place]. He remains functionally independent including driving an automobile and cares for himself. He does have a daughter who is with him today and  lives next door and provides some assistance. He has 2 adult sons who do not live nearby.He reports a progressive decline in his functional status over the past year or more that has primarily been related to progressive dyspnea and fatigue. He now gets short of breath with minimal activity and occasionally at rest. He has some degree of chest discomfort associated with shortness of breath that occurs intermittently and lasts 1-2 minutes. He had a severe episode of chest discomfort last Sunday while reading the paper that lasted 20 minutes. He sleeps on 2 pillows and cannot lay flat in bed. He has mild bilateral lower extremity edema. He denies any history of PND, dizzy spells, or syncope.He also experiences chronic pain in his left knee related to degenerative arthritis but feels that he is too old to have a knee replacement. He has chronic pain in his neck due to DJD and has been taking Oxycodone for the past 4 months for that.   Past Medical History  Diagnosis Date  . Hypertension   . Asthma   . Gout   . Hyperlipidemia   . Chronic kidney disease   .  Arthritis   . Aortic stenosis   . CAD (coronary artery disease)   . Stroke     1965  . Inguinal hernia, right 05/15/2013  . COPD (chronic obstructive pulmonary disease) 10/11/2011  .  Chronic kidney disease (CKD)   . Coronary artery disease involving native coronary artery   . Coronary artery disease involving coronary bypass graft 05/10/2014  . Zenker's diverticulum 05/21/2014  . Chronic combined systolic and diastolic congestive heart failure   . Mitral regurgitation   . S/P CABG x 2 01/06/1997    LIMA to LAD, SVG to OM, open vein harvest right lower leg - [facility]  . Anginal pain   . Shortness of breath dyspnea   . GERD (gastroesophageal reflux disease)   . Cancer     SKIN CANCER REMOVED L ELBOW    Past Surgical History  Procedure Laterality Date  . Left and right heart catheterization with coronary/graft angiogram N/A 05/10/2014    Procedure: LEFT AND RIGHT HEART CATHETERIZATION WITH Beatrix Fetters;  Surgeon: Sherren Mocha, MD;  Location: [facility] CATH LAB;  Service: Cardiovascular;  Laterality: N/A;  . Cardiac catheterization    . Coronary artery bypass graft  01/06/1997    LIMA to LAD, SVG to OM, open SVG harvest right lower leg - [facility]  . Tee without cardioversion N/A 05/17/2014    Procedure: TRANSESOPHAGEAL ECHOCARDIOGRAM (TEE);  Surgeon: Lelon Perla, MD;  Location: [facility] ENDOSCOPY;  Service: Cardiovascular;  Laterality: N/A;    Family History  Problem Relation Age of Onset  . Heart attack Mother   . Heart attack Brother     History   Social History  . Marital Status: Widowed    Spouse Name: N/A  . Number of Children: 3  . Years of Education: N/A   Occupational History  . Not on file.   Social History Main Topics  . Smoking status: Former Smoker    Quit date: 02/06/1963  . Smokeless tobacco: Not on file  . Alcohol Use: 0.0 oz/week    0 Standard drinks or equivalent per week     Comment: One beer per day  . Drug Use: No  . Sexual Activity: Not Currently   Other Topics Concern  . Not on file   Social History Narrative    Current Outpatient Prescriptions  Medication Sig Dispense Refill    . albuterol (PROVENTIL HFA;VENTOLIN HFA) 108 (90 BASE) MCG/ACT inhaler Inhale 1 puff into the lungs every 4 (four) hours as needed for shortness of breath.     . allopurinol (ZYLOPRIM) 100 MG tablet Take 1 tablet (100 mg total) by mouth daily. 90 tablet 3  . aspirin 81 MG tablet Take 81 mg by mouth at bedtime.     Marland Kitchen atorvastatin (LIPITOR) 40 MG tablet Take 1 tablet (40 mg total) by mouth daily. 90 tablet 3  . budesonide-formoterol (SYMBICORT) 160-4.5 MCG/ACT inhaler Inhale 2 puffs into the lungs 2 (two) times daily. 1 Inhaler 0  . Capsaicin-Menthol (SALONPAS GEL) 0.025-1.25 % PTCH Apply 1 patch topically 3 (three) times daily.    . Cholecalciferol (VITAMIN D) 2000 UNITS tablet Take 2,000 Units by mouth.    . esomeprazole (NEXIUM) 40 MG capsule Take 1 capsule (40 mg total) by mouth daily at 12 noon. 90 capsule 3  . fluticasone (FLONASE) 50 MCG/ACT nasal spray One spray in each nostril twice a day, use left hand for right nostril, and right hand for left nostril. 48 g 0  .  furosemide (LASIX) 40 MG tablet Take 1 tablet (40 mg total) by mouth daily. 5 tablet 0  . Glucosamine Sulfate-MSM 500-400 MG CAPS Take by mouth 3 (three) times daily.    Marland Kitchen lisinopril-hydrochlorothiazide (PRINZIDE,ZESTORETIC) 20-25 MG per tablet Take 0.5 tablets by mouth daily. 90 tablet 3  . oxyCODONE-acetaminophen (PERCOCET/ROXICET) 5-325 MG per tablet Take 0.5 tablets by mouth every 8 (eight) hours as needed. 45 tablet 0  . Tiotropium Bromide Monohydrate (SPIRIVA RESPIMAT) 2.5 MCG/ACT AERS Inhale 2.5 mg into the lungs every morning.     No current facility-administered medications for this visit.    Allergies  Allergen Reactions  . Beta Adrenergic Blockers     Too low  . Hydrocodone Rash      Review of Systems:   General:normal appetite, decreased energy, no weight gain, no weight loss, no fever Cardiac:+ chest pain with exertion, + chest pain at rest, +SOB  with minimal exertion, + intermittent resting SOB, no PND, + orthopnea, no palpitations, no arrhythmia, no atrial fibrillation, + LE edema, no dizzy spells, no syncope Respiratory:+ progressive shortness of breath, no home oxygen, no productive cough, + chronic dry cough, no bronchitis, intermittent wheezing, no hemoptysis, + asthma, no pain with inspiration or cough, no sleep apnea, no CPAP at night GI:+ difficulty swallowing, + reflux, no frequent heartburn, no hiatal hernia, no abdominal pain, no constipation, no diarrhea, no hematochezia, no hematemesis, no melena GU:no dysuria, + frequency, no urinary tract infection, no hematuria, no enlarged prostate, no kidney stones, + chronic kidney disease Vascular:no pain suggestive of claudication, no pain in feet, no leg cramps, no varicose veins, no DVT, no non-healing foot ulcer Neuro:+ remote stroke, no TIA's, no seizures, no headaches, no temporary blindness one eye, no slurred speech, ? possible peripheral neuropathy, + chronic pain, mild instability of gait, no memory/cognitive dysfunction Musculoskeletal:+ arthritis particularly left knee, + joint swelling, no myalgias, mild difficulty walking, mildly limited mobility  Skin:no rash, no itching, no skin infections, no pressure sores or ulcerations Psych:no anxiety, no depression, no nervousness, no unusual recent stress Eyes:+ blurry vision, no floaters, + recent vision changes, + wears glasses or contacts ENT:+ hearing loss, no loose or painful teeth, edentulous with full dentures, last saw dentist several years  ago Hematologic:+ easy bruising, no abnormal bleeding, no clotting disorder, no frequent epistaxis Endocrine:no diabetes, does not check CBG's at home      Physical Exam:   BP 140/72 mmHg  Pulse 78  Resp 20  Ht 5\' 5"  (1.651 m)  Wt 141 lb (63.957 kg)  BMI 23.46 kg/m2  SpO2 98%  General:  Elderly, frail-appearing gentleman in no distress  HEENT:  Unremarkable , [place]/AT, mass in right temporal scalp that is soft and nontender that he says has been there for 50 years. PERLA, EOMI.  Neck:   no JVD, no bruits, no adenopathy or thyromegaly  Chest:   clear to auscultation, symmetrical breath sounds, no wheezes, no rhonchi   CV:   RRR, grade III/VI crescendo/decrescendo murmur heard best at LLSB,  no diastolic murmur  Abdomen:  soft, non-tender, no masses or organomegaly  Extremities:  warm, well-perfused, femoral pulses diminished, pedal pulses not palpable , no LE edema  Rectal/GU  Deferred  Neuro:   Grossly non-focal and symmetrical throughout  Skin:   Clean and dry, no rashes, no breakdown, ecchymotic patchs on arms.    Diagnostic Tests:   Transthoracic Echocardiography  Patient:  Caedan, Sumler MR #:    [id number] Study Date: 03/17/2014  Gender:   M Age:    22 Height:   165.1 cm Weight:   66.2 kg BSA:    1.75 m^2 Pt. Status: Room:  ATTENDING  Silverio Decamp ORDERING   Silverio Decamp REFERRING  Silverio Decamp SONOGRAPHER Donata Clay PERFORMING  [facility], Outpatient  cc:  ------------------------------------------------------------------- LV EF: 60% -  65%  ------------------------------------------------------------------- Indications:   Aortic stenosis 424.1.  ------------------------------------------------------------------- History:  PMH:  Coronary artery disease. Chronic obstructive pulmonary disease. Risk factors: Hypertension.  Dyslipidemia.  ------------------------------------------------------------------- Study Conclusions  - Left ventricle: The cavity size was normal. Wall thickness was increased in a pattern of moderate LVH. Systolic function was normal. The estimated ejection fraction was in the range of 60% to 65%. Wall motion was normal; there were no regional wall motion abnormalities. Doppler parameters are consistent with abnormal left ventricular relaxation (grade 1 diastolic dysfunction). The E/e&' ratio is between 8-15, suggesting indeterminate (but likely elevated) LV filling pressure. - Aortic valve: Heavily calcified with restricted leaflet motion. There is severe aortic stenosis- peak and mean gradients of 95 and 40 mmHg. Based on an LVOT diameter of 1.6 cm, the calculated AVA is 0.5-0.6 cm2. There was mild regurgitation. Valve area (VTI): 0.48 cm^2. Valve area (Vmax): 0.54 cm^2. Valve area (Vmean): 0.46 cm^2. - Mitral valve: Bileaflet thickening. Cannot r/o a rheumatic valve. There is moderate to severe central regurgitation. - Left atrium: Moderately dilated at 41 ml/m2. - Atrial septum: There was increased thickness of the septum, consistent with lipomatous hypertrophy. - Tricuspid valve: There was mild regurgitation. - Pulmonary arteries: PA peak pressure: 40 mm Hg (S).  Impressions:  - LVEF 60-65%, moderate LVH, diastolic dysfunction, likely elevated LV filling pressure, severe calcific aortic stenosis, mean gradient of 40 mmHg, AVA of 0.5-0.6 cm2, moderate to severe central MR with bileaflet thickening and sclerosis (cannot r/o a rheumatic valve), moderately dilated LA, mild TR, RVSP 40 mmHg.  ------------------------------------------------------------------- Labs, prior tests, procedures, and surgery: Coronary artery bypass grafting.  Transthoracic echocardiography. M-mode, complete 2D, spectral Doppler, and color Doppler. Birthdate:  Patient birthdate: [birth year]. Age: Patient is 81 yr old. Sex: Gender: male. BMI: 24.3 kg/m^2. Blood pressure:   134/65 Patient status: Outpatient. Study date: Study date: 03/17/2014. Study time: 02:14 PM. Location: Echo laboratory.  -------------------------------------------------------------------  ------------------------------------------------------------------- Left ventricle: The cavity size was normal. Wall thickness was increased in a pattern of moderate LVH. Systolic function was normal. The estimated ejection fraction was in the range of 60% to 65%. Wall motion was normal; there were no regional wall motion abnormalities. Doppler parameters are consistent with abnormal left ventricular relaxation (grade 1 diastolic dysfunction). The E/e&' ratio is between 8-15, suggesting indeterminate (but likely elevated) LV filling pressure.  ------------------------------------------------------------------- Aortic valve: Heavily calcified with restricted leaflet motion. There is severe aortic stenosis- peak and mean gradients of 95 and 40 mmHg. Based on an LVOT diameter of 1.6 cm, the calculated AVA is 0.5-0.6 cm2. Doppler: There was mild regurgitation.  VTI ratio of LVOT to aortic valve: 0.24. Valve area (VTI): 0.48 cm^2. Indexed valve area (VTI): 0.27 cm^2/m^2. Valve area (Vmax): 0.54 cm^2. Indexed valve area (Vmax): 0.31 cm^2/m^2. Mean velocity ratio of LVOT to aortic valve: 0.23. Valve area (Vmean): 0.46 cm^2. Indexed valve area (Vmean): 0.27 cm^2/m^2.  Mean gradient (S): 40 mm Hg. Peak gradient (S): 78 mm Hg.  ------------------------------------------------------------------- Aorta: Aortic root: The aortic root was normal in size. Ascending aorta: The ascending aorta was normal in size.  ------------------------------------------------------------------- Mitral valve: Bileaflet thickening. Cannot r/o a  rheumatic valve. There is moderate to severe  central regurgitation.  ------------------------------------------------------------------- Left atrium: Moderately dilated at 41 ml/m2.  ------------------------------------------------------------------- Atrial septum: There was increased thickness of the septum, consistent with lipomatous hypertrophy.  ------------------------------------------------------------------- Right ventricle: The cavity size was normal. Wall thickness was normal. Systolic function was normal.  ------------------------------------------------------------------- Pulmonic valve:  The valve appears to be grossly normal. Doppler: There was mild regurgitation.  ------------------------------------------------------------------- Tricuspid valve:  Doppler: There was mild regurgitation.  ------------------------------------------------------------------- Pulmonary artery:  The main pulmonary artery was normal-sized.  ------------------------------------------------------------------- Right atrium: The atrium was normal in size.  ------------------------------------------------------------------- Pericardium: There was no pericardial effusion.  ------------------------------------------------------------------- Systemic veins: Inferior vena cava: The vessel was normal in size. The respirophasic diameter changes were in the normal range (= 50%), consistent with normal central venous pressure.  ------------------------------------------------------------------- Measurements  Left ventricle              Value     Reference LV ID, ED, PLAX chordal      (L)   40.9 mm    43 - 52 LV ID, ES, PLAX chordal          28.3 mm    23 - 38 LV fx shortening, PLAX chordal      31  %    >=29 LV PW thickness, ED            10.3 mm    --------- IVS/LV PW ratio, ED            0.88      <=1.3 Stroke volume, 2D              46  ml    --------- Stroke volume/bsa, 2D           26  ml/m^2  ---------  Ventricular septum            Value     Reference IVS thickness, ED             9.02 mm    ---------  LVOT                   Value     Reference LVOT ID, S                16  mm    --------- LVOT area                 2.01 cm^2   --------- LVOT peak velocity, S           119  cm/s   --------- LVOT mean velocity, S           67.5 cm/s   --------- LVOT VTI, S                22.8 cm    --------- LVOT peak gradient, S           6   mm Hg  ---------  Aortic valve               Value     Reference Aortic valve mean velocity, S       292  cm/s   --------- Aortic valve VTI, S            95  cm    --------- Aortic mean gradient, S          40  mm Hg  --------- Aortic peak gradient, S          78  mm Hg  --------- VTI ratio, LVOT/AV            0.24      --------- Aortic valve area, VTI          0.48 cm^2   --------- Aortic valve area/bsa, VTI        0.27 cm^2/m^2 --------- Aortic valve area, peak velocity     0.54 cm^2   --------- Aortic valve area/bsa, peak        0.31 cm^2/m^2 --------- velocity Velocity ratio, mean, LVOT/AV       0.23      --------- Aortic valve area, mean velocity     0.46 cm^2   --------- Aortic valve area/bsa, mean        0.27 cm^2/m^2 --------- velocity Aortic regurg pressure half-time     377  ms    ---------  Aorta                   Value     Reference Aortic root ID, ED            30  mm    ---------  Left atrium                Value     Reference LA ID, A-P,  ES              48  mm    --------- LA ID/bsa, A-P          (H)   2.74 cm/m^2  <=2.2 LA volume, S               70  ml    --------- LA volume/bsa, S             40  ml/m^2  --------- LA volume, ES, 1-p A4C          66  ml    --------- LA volume/bsa, ES, 1-p A4C        37.7 ml/m^2  --------- LA volume, ES, 1-p A2C          68  ml    --------- LA volume/bsa, ES, 1-p A2C        38.8 ml/m^2  ---------  Mitral valve               Value     Reference Mitral regurg VTI, PISA          187  cm    --------- Mitral ERO, PISA             0.15 cm^2   --------- Mitral regurg volume, PISA        28  ml    ---------  Pulmonary arteries            Value     Reference PA pressure, S, DP        (H)   40  mm Hg  <=30  Tricuspid valve              Value     Reference Tricuspid regurg peak velocity      306  cm/s   --------- Tricuspid peak RV-RA gradient       37  mm Hg  ---------  Systemic veins              Value     Reference Estimated CVP               3   mm Hg  ---------  Right ventricle              Value     Reference RV pressure, S, DP        (H)   40  mm Hg  <=30  Pulmonic valve              Value     Reference Pulmonic regurg velocity, ED       87.7 cm/s   ---------  Legend: (L) and (H) mark values outside specified reference range.  ------------------------------------------------------------------- Prepared and Electronically Authenticated by  Lyman Bishop MD 2016-02-10T17:11:56    Cardiac Catheterization Procedure Note  Name: Evens Meno MRN: [id number] DOB: 1925-10-18  Procedure: Right Heart Cath, Left  Heart Cath, Selective Coronary Angiography, SVG angiography, LIMA angiography  Indication: Aortic stenosis and mitral regurgitation. 81 year old gentleman with history of remote CABG in 1999. He has developed progressive exertional dyspnea over the last year, now with New York Heart Association functional class III symptoms. Echocardiography has demonstrated findings consistent with severe aortic stenosis and moderately severe mitral regurgitation. He has preserved LV systolic function. He is referred for cardiac catheterization to further evaluate treatment options for his coronary and valvular heart disease.  Procedural Details: The right groin was prepped, draped, and anesthetized with 1% lidocaine. Using the modified Seldinger technique a 5 French sheath was placed in the right femoral artery and a 7 French sheath was placed in the right femoral vein. A Swan-Ganz catheter was used for the right heart catheterization. Standard protocol was followed for recording of right heart pressures and sampling of oxygen saturations. Fick cardiac output was calculated. Standard Judkins catheters were used for selective coronary angiography, saphenous vein graft angiography, and LIMA angiography. The JR4 catheter was used to cross the aortic valve and record left ventricular pressure. The same catheter was used for saphenous vein graft angiography. A LIMA catheter was used for LIMA angiography. Ventriculography was deferred because of chronic kidney disease.. There were no immediate procedural complications. The patient was transferred to the post catheterization recovery area for further monitoring.  Procedural Findings: Hemodynamics RA 8 RV 50/9 PA 45/21 with a mean of 35 PCWP A wave 19, V-wave 19, mean of 21 LV 169/13 AO 147/74 with a mean of 104  Oxygen saturations: PA 62 AO 94  Cardiac Output (Fick) 4.0  Cardiac Index (Fick) 2.35  Aortic valve hemodynamics: Mean gradient 25 mmHg, aortic valve  area 0.8 cm  Coronary angiography: Coronary dominance: right  Left mainstem: Heavy calcification with 95% distal left main stenosis  Left anterior descending (LAD): The LAD is severely calcified. The vessel is totally occluded proximally.  Left circumflex (LCx): The left circumflex is severely diseased. There is 95% ostial stenosis. The obtuse marginal branches are occluded. The distal posterolateral branches are small, but patent. There is diffuse 80-90% mid left circumflex stenosis.  Right coronary artery (RCA): The RCA is dominant. The vessel is diffusely calcified. The mid RCA has 80% stenosis. The PLA branch is patent. The PDA branch has area severe diffuse disease with multiple sequential 90-95% stenoses. The vessel is small beyond the area of severe stenosis.  Saphenous vein graft to OM 2: The graft is patent. The mid body of the graft as eccentric 75% stenosis. Beyond the graft insertion site, the native OM 2 is severely diseased with 95% stenosis into a small subbranch. The first OM is also small and it fills retrograde from the graft insertion site.  LIMA to LAD: The LIMA is a large  conduit. The vessel is widely patent. The anastomotic site is intact. The LAD is patent and there is 80% stenosis in the apical portion of the LAD. The diagonal and septal perforator branches all fill from the graft.  Left ventriculography: Deferred because of chronic kidney disease  Estimated Blood Loss: minimal  Final Conclusions:  1. Severe three-vessel coronary artery disease 2. Status post aortocoronary bypass surgery with continued patency of the LIMA to LAD and saphenous vein graft to OM 2 3. Severe aortic stenosis 4. Known moderate to severe mitral regurgitation based on echo findings 5. Moderate point hypertension, likely related to left heart disease  Recommendations: The patient has diffuse distal vessel CAD. He has at least moderate stenosis of the saphenous vein graft to OM 2. He  does not have very good revascularization options because of the diffuse nature of his CAD and distal vessel location. The patient denies symptoms of angina and primarily is limited by shortness of breath. He has severe valvular disease involving both the aortic and mitral valves. I reviewed his echo images and agree that he has severe aortic stenosis and probably severe mitral regurgitation. I think it TEE would be helpful to better delineate his treatment options. If he has purely functional mitral regurgitation, this may improve significantly with treatment of his aortic stenosis. After his TEE, would recommend formal cardiac surgical evaluation for further consideration of treatment options.  Sherren Mocha    ESOPHOGRAM / BARIUM SWALLOW / BARIUM TABLET STUDY  TECHNIQUE: Combined double contrast and single contrast examination performed using effervescent crystals, thick barium liquid, and thin barium liquid. The patient was observed with fluoroscopy swallowing a 109mm barium sulphate tablet.  FLUOROSCOPY TIME: Fluoroscopy Time: 1 min 8 seconds  COMPARISON: None.  FINDINGS: The patient has persistent spasm of the cricopharyngeus muscle with formation of a variable sized Zenker's diverticulum. The diverticulum has a variable broad neck. The rest of the esophagus appears normal. There is no stricture or mass. No esophagitis. No visible hiatal hernia.  A 13 mm barium tablet passed immediately from the mouth to the stomach with no delay. The patient reports that pills do lodge in the diverticulum since he can taste them for a long time after swallowing them.  The patient has prominent residual in the piriform sinuses after swallowing but there was no visible aspiration.  IMPRESSION: Persistent prominent spasm of the cricopharyngeus muscle with a variable size pulsion diverticulum at the C6-7 level just above the cricopharyngeus.   Electronically Signed  By: Lorriane Shire M.D.  On: 05/21/2014 10:39   ADDENDUM REPORT: 06/08/2014 16:06  CLINICAL DATA: Aortic stenosis  EXAM: Cardiac TAVR CT  TECHNIQUE: The patient was scanned on a Philips 256 scanner. A 120 kV retrospective scan was triggered in the descending thoracic aorta at 111 HU's. Gantry rotation speed was 270 msecs and collimation was .9 mm. No beta blockade or nitro were given. The 3D data set was reconstructed in 5% intervals of the R-R cycle. Systolic and diastolic phases were analyzed on a dedicated work station using MPR, MIP and VRT modes. The patient received 80 cc of contrast.  FINDINGS: Aortic Valve: Trileaflet and heavily calcified. Bulky annular calcification at the base of the left leaflet and smaller amount of nodular calcification at the base of the non coronary leaflet  Sinotubular Junction: 24 mm  Ascending Thoracic Aorta: 29 mm  Aortic Arch: Not well seen on cardiac portion of exam  Descending Thoracic Aorta: 28 mm  Sinus of Valsalva Measurements:  Non-coronary: 31 mm  Right -coronary: 27 mm  Left -coronary: 29 mm  Coronary Artery Height above Annulus:  Left Main: 13.2 mm (but supplied by LIMA and SVG to OM  Right Coronary: 12.7 mm  Virtual Basal Annulus Measurements:  Maximum/Minimum Diameter: 26.5 mm x 20.7 mm  Perimeter: 75 mm  Area: 419 mm2  Coronary Arteries: Sufficient height above annulus for deployment Patent LIMA to LAD and patent SVG to OM2  Optimum Fluoroscopic Angle for Delivery: Essentially AP LAO 1 degree Cranial 0 degrees  IMPRESSION: 1) Calcified tri leaflet aortic valve suitable for 23 mm Sapien 3 valve Annular area 419 mm2  2) Significant bulky annular calcification at base of left cusp and less so at base of non coronary cusp that could increase risk of perivalvular regurgitation post deployment  3) Suitable coronary height above annulus for delivery although left system  supplied by LIMA and SVG to OM  4) Optimum angiographic angle for delivery essentially AP (LAO 1 degree Cranial 0 degrees)  Jenkins Rouge   Electronically Signed  By: Jenkins Rouge M.D.  On: 06/08/2014 16:06      Study Result     EXAM: OVER-READ INTERPRETATION CT CHEST  The following report is an over-read performed by radiologist Dr. Laurence Ferrari [facility] on 06/08/2014. This over-read does not include interpretation of cardiac or coronary anatomy or pathology. The coronary CTA interpretation by the cardiologist is attached.  COMPARISON: None.  FINDINGS: Secondary pulmonary lobular interstitial accentuation noted with faint ground-glass opacities scattered in both lungs. Bilateral airway thickening especially in the lower lobes.  Prior CABG. Small type 1 hiatal hernia.  IMPRESSION: 1. Scattered ground-glass opacities and secondary pulmonary lobular interstitial accentuation favor pulmonary edema. 2. Bilateral airway thickening, particularly in the lower lobes, possibly from edema or inflammation. 3. Small type 1 hiatal hernia  Electronically Signed: By: Van Clines M.D. On: 06/08/2014 10:16     CLINICAL DATA: 81 year old male with history of severe aortic stenosis. Preprocedural study prior to potential transcatheter aortic valve replacement (TAVR).  EXAM: CT ANGIOGRAPHY CHEST, ABDOMEN AND PELVIS  TECHNIQUE: Multidetector CT imaging through the chest, abdomen and pelvis was performed using the standard protocol during bolus administration of intravenous contrast. Multiplanar reconstructed images and MIPs were obtained and reviewed to evaluate the vascular anatomy.  CONTRAST: 83mL OMNIPAQUE IOHEXOL 350 MG/ML SOLN  COMPARISON: Chest CT 06/08/2014.  FINDINGS: CTA CHEST FINDINGS  Mediastinum/Lymph Nodes: Heart size is mildly enlarged. The apex of the left ventricle in is present immediately deep to  the anterior left fifth/sixth intercostal space. There is no significant pericardial fluid, thickening or pericardial calcification. Severe thickening calcification of the aortic valve. There is atherosclerosis of the thoracic aorta, the great vessels of the mediastinum and the coronary arteries, including calcified atherosclerotic plaque in the left main, left anterior descending, left circumflex and right coronary arteries. Status post median sternotomy for CABG, including LIMA to the LAD. Multiple borderline enlarged mediastinal lymph nodes are nonspecific. Small hiatal hernia. No axillary lymphadenopathy.  Lungs/Pleura: A few scattered tiny pulmonary nodules are noted in the lungs bilaterally, largest of which measures only 5 mm in the left upper lobe (image 25 of series 403). No larger more suspicious appearing pulmonary nodules or masses are noted. No acute consolidative airspace disease. No pleural effusions.  Musculoskeletal/Soft Tissues: Median sternotomy wires. There are no aggressive appearing lytic or blastic lesions noted in the visualized portions of the skeleton.  CTA ABDOMEN AND PELVIS FINDINGS  Hepatobiliary: No cystic or solid  hepatic lesions. No intra or extrahepatic biliary ductal dilatation. Tiny 2 mm calcified gallstone lying dependently in the neck of the gallbladder. Gallbladder is otherwise normal in appearance.  Pancreas: No pancreatic mass. No pancreatic ductal dilatation. No pancreatic or peripancreatic fluid or inflammatory changes.  Spleen: Unremarkable.  Adrenals/Urinary Tract: Bilateral adrenal glands are normal in appearance. Mild cortical thinning in the kidneys bilaterally. The appearance of the kidneys is otherwise normal. No hydroureteronephrosis. Urinary bladder is normal in appearance.  Stomach/Bowel: The appearance of the stomach is normal. No pathologic dilatation of small bowel or colon. Several colonic diverticulae are noted,  particularly in the sigmoid colon, without surrounding inflammatory changes to suggest an acute diverticulitis at this time. Small right inguinal hernia containing several loops of distal small bowel, without evidence of incarceration or obstruction at this time.  Vascular/Lymphatic: Vascular findings and measurements pertinent to potential TAVR procedure, as detailed below. In addition, there are ulcerated plaques in the infrarenal abdominal aorta (image 168 of series 401), and in the left common iliac artery (image 203 of series 401). There is also fusiform ectasia of the infrarenal abdominal aorta measuring up to 2.5 x 2.4 cm (image 179 of series 401). Stenosis at the ostium of the right renal artery appears potentially hemodynamically significant. Single renal arteries bilaterally. There also appears to be a potentially hemodynamically significant stenosis at the ostium of the inferior mesenteric artery. Celiac axis and SMA are widely patent. No lymphadenopathy noted in the abdomen or pelvis.  Reproductive: Prostate gland appears mildly enlarged measuring 5.6 x 4.1 cm. Seminal vesicles are unremarkable in appearance.  Other: No significant volume of ascites. No pneumoperitoneum.  Musculoskeletal: There are no aggressive appearing lytic or blastic lesions noted in the visualized portions of the skeleton.  VASCULAR MEASUREMENTS PERTINENT TO TAVR:  AORTA:  Minimal Aortic Diameter - 12 x 15 mm  Severity of Aortic Calcification - moderate to severe  RIGHT PELVIS:  Right Common Iliac Artery -  Minimal Diameter - 7.1 x 4.2 mm  Tortuosity - mild  Calcification - severe  Right External Iliac Artery -  Minimal Diameter - 5.2 x 4.6 mm  Tortuosity - mild  Calcification - moderate  Right Common Femoral Artery -  Minimal Diameter - 5.8 x 4.4 mm  Tortuosity - mild  Calcification - moderate  LEFT PELVIS:  Left Common Iliac Artery  -  Minimal Diameter - 4.7 x 3.7 mm  Tortuosity - mild  Calcification - moderate  Left External Iliac Artery -  Minimal Diameter - 5.0 x 4.8 mm  Tortuosity - mild  Calcification - moderate  Left Common Femoral Artery -  Minimal Diameter - 4.7 x 4.2 mm  Tortuosity - mild  Calcification - moderate  Review of the MIP images confirms the above findings.  IMPRESSION: 1. Vascular findings and measurements pertinent to potential TAVR procedure, as detailed above. This patient does not appear to have suitable pelvic arterial access for TAVR. 2. The apex of the left ventricle is immediately deep to the anterior aspect of the left fifth/sixth intercostal space. 3. A few scattered tiny pulmonary nodules are nonspecific, largest of which measures only 5 mm in the left upper lobe. If the patient is at high risk for bronchogenic carcinoma, follow-up chest CT at 6-12 months is recommended. If the patient is at low risk for bronchogenic carcinoma, follow-up chest CT at 12 months is recommended. This recommendation follows the consensus statement: Guidelines for Management of Small Pulmonary Nodules Detected on CT Scans: A Statement  from the [place] as published in Radiology 2005;237:395-400. 4. Colonic diverticulosis without findings to suggest acute diverticulitis at this time. 5. Cholelithiasis without evidence of acute cholecystitis at this time. 6. Additional incidental findings, as above.   Electronically Signed  By: Vinnie Langton M.D.  On: 06/16/2014 13:15           Vitals     Height Weight BMI (Calculated)    5\' 5"  (1.651 m) 141 lb (63.957 kg) 23.5      Interpretation Summary     CLINICAL DATA: 81 year old male with history of severe aortic stenosis. Preprocedural study prior to potential transcatheter aortic valve replacement (TAVR).  EXAM: CT ANGIOGRAPHY CHEST, ABDOMEN AND PELVIS  TECHNIQUE: Multidetector CT  imaging through the chest, abdomen and pelvis was performed using the standard protocol during bolus administration of intravenous contrast. Multiplanar reconstructed images and MIPs were obtained and reviewed to evaluate the vascular anatomy.  CONTRAST: 47mL OMNIPAQUE IOHEXOL 350 MG/ML SOLN  COMPARISON: Chest CT 06/08/2014.  FINDINGS: CTA CHEST FINDINGS  Mediastinum/Lymph Nodes: Heart size is mildly enlarged. The apex of the left ventricle in is present immediately deep to the anterior left fifth/sixth intercostal space. There is no significant pericardial fluid, thickening or pericardial calcification. Severe thickening calcification of the aortic valve. There is atherosclerosis of the thoracic aorta, the great vessels of the mediastinum and the coronary arteries, including calcified atherosclerotic plaque in the left main, left anterior descending, left circumflex and right coronary arteries. Status post median sternotomy for CABG, including LIMA to the LAD. Multiple borderline enlarged mediastinal lymph nodes are nonspecific. Small hiatal hernia. No axillary lymphadenopathy.  Lungs/Pleura: A few scattered tiny pulmonary nodules are noted in the lungs bilaterally, largest of which measures only 5 mm in the left upper lobe (image 25 of series 403). No larger more suspicious appearing pulmonary nodules or masses are noted. No acute consolidative airspace disease. No pleural effusions.  Musculoskeletal/Soft Tissues: Median sternotomy wires. There are no aggressive appearing lytic or blastic lesions noted in the visualized portions of the skeleton.  CTA ABDOMEN AND PELVIS FINDINGS  Hepatobiliary: No cystic or solid hepatic lesions. No intra or extrahepatic biliary ductal dilatation. Tiny 2 mm calcified gallstone lying dependently in the neck of the gallbladder. Gallbladder is otherwise normal in appearance.  Pancreas: No pancreatic mass. No pancreatic ductal  dilatation. No pancreatic or peripancreatic fluid or inflammatory changes.  Spleen: Unremarkable.  Adrenals/Urinary Tract: Bilateral adrenal glands are normal in appearance. Mild cortical thinning in the kidneys bilaterally. The appearance of the kidneys is otherwise normal. No hydroureteronephrosis. Urinary bladder is normal in appearance.  Stomach/Bowel: The appearance of the stomach is normal. No pathologic dilatation of small bowel or colon. Several colonic diverticulae are noted, particularly in the sigmoid colon, without surrounding inflammatory changes to suggest an acute diverticulitis at this time. Small right inguinal hernia containing several loops of distal small bowel, without evidence of incarceration or obstruction at this time.  Vascular/Lymphatic: Vascular findings and measurements pertinent to potential TAVR procedure, as detailed below. In addition, there are ulcerated plaques in the infrarenal abdominal aorta (image 168 of series 401), and in the left common iliac artery (image 203 of series 401). There is also fusiform ectasia of the infrarenal abdominal aorta measuring up to 2.5 x 2.4 cm (image 179 of series 401). Stenosis at the ostium of the right renal artery appears potentially hemodynamically significant. Single renal arteries bilaterally. There also appears to be a potentially hemodynamically significant stenosis at the ostium of the  inferior mesenteric artery. Celiac axis and SMA are widely patent. No lymphadenopathy noted in the abdomen or pelvis.  Reproductive: Prostate gland appears mildly enlarged measuring 5.6 x 4.1 cm. Seminal vesicles are unremarkable in appearance.  Other: No significant volume of ascites. No pneumoperitoneum.  Musculoskeletal: There are no aggressive appearing lytic or blastic lesions noted in the visualized portions of the skeleton.  VASCULAR MEASUREMENTS PERTINENT TO TAVR:  AORTA:  Minimal Aortic Diameter  - 12 x 15 mm  Severity of Aortic Calcification - moderate to severe  RIGHT PELVIS:  Right Common Iliac Artery -  Minimal Diameter - 7.1 x 4.2 mm  Tortuosity - mild  Calcification - severe  Right External Iliac Artery -  Minimal Diameter - 5.2 x 4.6 mm  Tortuosity - mild  Calcification - moderate  Right Common Femoral Artery -  Minimal Diameter - 5.8 x 4.4 mm  Tortuosity - mild  Calcification - moderate  LEFT PELVIS:  Left Common Iliac Artery -  Minimal Diameter - 4.7 x 3.7 mm  Tortuosity - mild  Calcification - moderate  Left External Iliac Artery -  Minimal Diameter - 5.0 x 4.8 mm  Tortuosity - mild  Calcification - moderate  Left Common Femoral Artery -  Minimal Diameter - 4.7 x 4.2 mm  Tortuosity - mild  Calcification - moderate  Review of the MIP images confirms the above findings.  IMPRESSION: 1. Vascular findings and measurements pertinent to potential TAVR procedure, as detailed above. This patient does not appear to have suitable pelvic arterial access for TAVR. 2. The apex of the left ventricle is immediately deep to the anterior aspect of the left fifth/sixth intercostal space. 3. A few scattered tiny pulmonary nodules are nonspecific, largest of which measures only 5 mm in the left upper lobe. If the patient is at high risk for bronchogenic carcinoma, follow-up chest CT at 6-12 months is recommended. If the patient is at low risk for bronchogenic carcinoma, follow-up chest CT at 12 months is recommended. This recommendation follows the consensus statement: Guidelines for Management of Small Pulmonary Nodules Detected on CT Scans: A Statement from the [place] as published in Radiology 2005;237:395-400. 4. Colonic diverticulosis without findings to suggest acute diverticulitis at this time. 5. Cholelithiasis without evidence of acute cholecystitis at this time. 6. Additional incidental  findings, as above.   Electronically Signed  By: Vinnie Langton M.D.  On: 06/16/2014 13:15     Ref Range 2wk ago    FVC-Pre L 2.33   FVC-%Pred-Pre % 79   FVC-Post L 2.35   FVC-%Pred-Post % 80   FVC-%Change-Post % 0   FEV1-Pre L 1.85   FEV1-%Pred-Pre % 93   FEV1-Post L 1.97   FEV1-%Pred-Post % 99   FEV1-%Change-Post % 6   FEV6-Pre L 2.30   FEV6-%Pred-Pre % 86   FEV6-Post L 2.35   FEV6-%Pred-Post % 88   FEV6-%Change-Post % 2   Pre FEV1/FVC ratio % 79   FEV1FVC-%Pred-Pre % 113   Post FEV1/FVC ratio % 84   FEV1FVC-%Change-Post % 5   Pre FEV6/FVC Ratio % 99   FEV6FVC-%Pred-Pre % 108   Post FEV6/FVC ratio % 100   FEV6FVC-%Pred-Post % 109   FEV6FVC-%Change-Post % 1   FEF 25-75 Pre L/sec 1.79   FEF2575-%Pred-Pre % 149   FEF 25-75 Post L/sec 2.35   FEF2575-%Pred-Post % 196   FEF2575-%Change-Post % 31   RV L 2.18   RV % pred % 86   TLC L 4.58   TLC %  pred % 75   DLCO unc ml/min/mmHg 9.06   DLCO unc % pred % 35   DLCO cor ml/min/mmHg 9.95   DLCO cor % pred % 38   DL/VA ml/min/mmHg/L 2.97   DL/VA % pred % 70   Resulting Agency [facility]       Specimen Collected: 06/14/14 8:10 AM Last Resulted: 06/14/14 9:01 AM          STS Risk Calculator  ProcedureRedo CABG + AVR  Risk of Mortality17.7% Morbidity or Mortality53.0% Prolonged LOS29.3% Short LOS7.4% Permanent Stroke6.6% Prolonged Vent Support39.9% DSW Infection0.6% Renal Failure19.6% Reoperation18.8%    Impression:  The patient has stage D severe symptomatic aortic stenosis with severe three-vessel coronary artery disease and  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 The patient also has at least moderate mitral regurgitation. He presents with progressive symptoms of exertional shortness of breath and fatigue as well as chest pain at rest and with exertion consistent with chronic combined systolic and diastolic congestive heart failure and unstable angina. He is  currently [place] Heart Association functional class III-VI. I have personally reviewed the patient's recent transthoracic echocardiogram and diagnostic cardiac catheterization. He has severe calcific AS with severe thickening, calcification, and restricted leaflet mobility involving all 3 leaflets of the aortic valve. The mean transvalvular gradient is 44 mmHg.There is at least moderate mitral regurgitation, although pulmonary artery pressures were only mildly elevated. Coronary arteriography shows severe three-vessel coronary artery disease with continued patency of both bypass grafts including a patent left internal mammary artery to the distal left anterior descending coronary artery and a patent vein graft to a large obtuse marginal branch. There is some progression of diffuse distal disease including 80% stenosis of the mid right coronary artery and diffuse high grade stenosis of the posterior descending coronary artery. I agree that risks associated with conventional surgery would be extremely high. I would not consider this  gentleman a candidate for redo coronary artery bypass grafting, aortic valve replacement, and mitral valve repair or replacement. I think TAVR would be the best option for his severe aortic stenosis. Hopefully the MR will improve with treatment of the severe AS.  I have personally reviewed the patient's recent CT angiograms. Cardiac gated CT shows findings consistent with severe aortic stenosis. The scan shows that the patient would be a candidate for TAVR with a 23 mm Sapien 3 valve.  There is a fair amount of  calcification in the aortic annulus that could slightly increase risk of paravalvular leak. However, there are no other significant complicating features. CT angiogram of the abdomen and pelvis demonstrates the presence of borderline pelvic vascular access with a small infrarenal abdominal aortic aneurysm and diffuse atherotic atherosclerotic disease with high-grade stenosis of the left common iliac artery and moderate stenosis of the right common iliac artery. The patient appears to have adequate lumen size for possible transfemoral approach on the right side but not on the left. However, there is diffuse disease with long segment calcification of the right common iliac artery, and a small localized dissection or ulcerated plaque.   The patient has been advised of a variety of complications that might develop including but not limited to risks of death, stroke, paravalvular leak, aortic dissection or other major vascular complications, aortic annulus rupture, device embolization, cardiac rupture or perforation, mitral regurgitation, acute myocardial infarction, arrhythmia, heart block or bradycardia requiring permanent pacemaker placement, congestive heart failure, respiratory failure, renal failure, pneumonia, infection, other late complications  related to structural valve deterioration or migration, or other complications that might ultimately cause a temporary or permanent loss of functional independence or other long term morbidity. The patient provides full informed consent for the procedure as described and all questions were answered.   Plan:  Right transfemoral TAVR on 07/02/2014. If transfemoral access is inadequate we will plan transapical access.    Gaye Pollack, MD 06/30/2014

## 2014-06-30 NOTE — Progress Notes (Signed)
   06/30/14 0841  OBSTRUCTIVE SLEEP APNEA  Have you ever been diagnosed with sleep apnea through a sleep study? No  Do you snore loudly (loud enough to be heard through closed doors)?  0  Do you often feel tired, fatigued, or sleepy during the daytime? 1  Has anyone observed you stop breathing during your sleep? 0  Do you have, or are you being treated for high blood pressure? 1  BMI more than 35 kg/m2? 0  Age over 79 years old? 1  Neck circumference greater than 40 cm/16 inches? 1  Gender: 1

## 2014-06-30 NOTE — Pre-Procedure Instructions (Signed)
Eric Buchanan  06/30/2014      [id number] [place], [place] - [address] [place] [address] Phone: [phone] Fax: [phone]  [place], [place] [address] [address] [place] [address] [place] [address] Phone: [phone] Fax: [phone]    Your procedure is scheduled on  Friday 07/02/14  Report to [facility] at  730 A.M.  Call this number if you have problems the morning of surgery:  [phone]   Remember:  Do not eat food or drink liquids after midnight.  Take these medicines the morning of surgery with A SIP OF WATER  ALBUTEROL, SYMBICORT, NEXIUM, SPIRIVA, OXYCODONE    Do not wear jewelry, make-up or nail polish.  Do not wear lotions, powders, or perfumes.  You may wear deodorant.  Do not shave 48 hours prior to surgery.  Men may shave face and neck.  Do not bring valuables to the hospital.  [facility] is not responsible for any belongings or valuables.  Contacts, dentures or bridgework may not be worn into surgery.  Leave your suitcase in the car.  After surgery it may be brought to your room.  For patients admitted to the hospital, discharge time will be determined by your treatment team.  Patients discharged the day of surgery will not be allowed to drive home.   Name and phone number of your driver:    Special instructions: [place] - Preparing for Surgery  Before surgery, you can play an important role.  Because skin is not sterile, your skin needs to be as free of germs as possible.  You can reduce the number of germs on you skin by washing with CHG (chlorahexidine gluconate) soap before surgery.  CHG is an antiseptic cleaner which kills germs and bonds with the skin to continue killing germs even after washing.  Please DO NOT use if you have an allergy to CHG or antibacterial soaps.  If your skin becomes reddened/irritated stop using the CHG and inform  your nurse when you arrive at Short Stay.  Do not shave (including legs and underarms) for at least 48 hours prior to the first CHG shower.  You may shave your face.  Please follow these instructions carefully:   1.  Shower with CHG Soap the night before surgery and the                                morning of Surgery.  2.  If you choose to wash your hair, wash your hair first as usual with your       normal shampoo.  3.  After you shampoo, rinse your hair and body thoroughly to remove the                      Shampoo.  4.  Use CHG as you would any other liquid soap.  You can apply chg directly       to the skin and wash gently with scrungie or a clean washcloth.  5.  Apply the CHG Soap to your body ONLY FROM THE NECK DOWN.        Do not use on open wounds or open sores.  Avoid contact with your eyes,       ears, mouth and genitals (private parts).  Wash genitals (private parts)  with your normal soap.  6.  Wash thoroughly, paying special attention to the area where your surgery        will be performed.  7.  Thoroughly rinse your body with warm water from the neck down.  8.  DO NOT shower/wash with your normal soap after using and rinsing off       the CHG Soap.  9.  Pat yourself dry with a clean towel.            10.  Wear clean pajamas.            11.  Place clean sheets on your bed the night of your first shower and do not        sleep with pets.  Day of Surgery  Do not apply any lotions/deoderants the morning of surgery.  Please wear clean clothes to the hospital/surgery center.   Please read over the following fact sheets that you were given. Pain Booklet, Coughing and Deep Breathing, Blood Transfusion Information, MRSA Information and Surgical Site Infection Prevention

## 2014-06-30 NOTE — Progress Notes (Signed)
Anesthesia Chart Review:  Pt is 81 year old male scheduled for TAVR and TEE on 07/02/2014 with Dr. Burt Knack.   PMH includes: CAD (s/p CABG 1998), severe aortic stenosis, mitral regurgitation, HTN, combined systolic and diastolic HF, stroke (9509), asthma, COPD, hyperlipidemia, CKD (stage 3). Former smoker. BMI 23.  Medications include: ASA, lipitor, lasix, lisinopril-hctz.   Preoperative labs reviewed.  Na 128. Notified Levonne Spiller, RN to pass on result to Dr. Burt Knack. Ordered Istat-4 for DOS to recheck Na.   Chest x-ray 06/29/2013 reviewed. No active disease. Stable hyperinflation and chronic interstitial prominence.   EKG 06/30/2014: Sinus rhythm with 1st degree A-V block with occasional PVCs and PACs. Left axis deviation. Left ventricular hypertrophy with repolarization abnormality.   Echo 03/17/2014: - Left ventricle: The cavity size was normal. Wall thickness was increased in a pattern of moderate LVH. Systolic function was normal. The estimated ejection fraction was in the range of 60% to 65%. Wall motion was normal; there were no regional wall motion abnormalities. Doppler parameters are consistent with abnormal left ventricular relaxation (grade 1 diastolic dysfunction). The E/e&' ratio is between 8-15, suggesting indeterminate (but likely elevated) LV filling pressure. - Aortic valve: Heavily calcified with restricted leaflet motion. There is severe aortic stenosis- peak and mean gradients of 95 and 40 mmHg. Based on an LVOT diameter of 1.6 cm, the calculatedAVA is 0.5-0.6 cm2. There was mild regurgitation. Valve area(VTI): 0.48 cm^2. Valve area (Vmax): 0.54 cm^2. Valve area (Vmean): 0.46 cm^2. - Mitral valve: Bileaflet thickening. Cannot r/o a rheumatic valve. There is moderate to severe central regurgitation. - Left atrium: Moderately dilated at 41 ml/m2. - Atrial septum: There was increased thickness of the septum, consistent with lipomatous hypertrophy. - Tricuspid valve: There was mild  regurgitation. - Pulmonary arteries: PA peak pressure: 40 mm Hg (S). Impressions:- LVEF 60-65%, moderate LVH, diastolic dysfunction, likely elevatedLV filling pressure, severe calcific aortic stenosis, meangradient of 40 mmHg, AVA of 0.5-0.6 cm2, moderate to severecentral MR with bileaflet thickening and sclerosis (cannot r/o arheumatic valve), moderately dilated LA, mild TR, RVSP 40 mmHg.  Cardiac cath 05/10/2014: 1. Severe three-vessel coronary artery disease 2. Status post aortocoronary bypass surgery with continued patency of the LIMA to LAD and saphenous vein graft to OM 2 3. Severe aortic stenosis 4. Known moderate to severe mitral regurgitation based on echo findings 5. Moderate point hypertension, likely related to left heart disease  Recommendations: The patient has diffuse distal vessel CAD. He has at least moderate stenosis of the saphenous vein graft to OM 2. He does not have very good revascularization options because of the diffuse nature of his CAD and distal vessel location. The patient denies symptoms of angina and primarily is limited by shortness of breath. He has severe valvular disease involving both the aortic and mitral valves. I reviewed his echo images and agree that he has severe aortic stenosis and probably severe mitral regurgitation. I think it TEE would be helpful to better delineate his treatment options. If he has purely functional mitral regurgitation, this may improve significantly with treatment of his aortic stenosis. After his TEE, would recommend formal cardiac surgical evaluation for further consideration of treatment options.  If lab results acceptable DOS I anticipate pt can proceed as scheduled.   Willeen Cass, FNP-BC [facility] Short Stay Surgical Center/Anesthesiology Phone: [phone] 06/30/2014 4:47 PM

## 2014-06-30 NOTE — Progress Notes (Signed)
LEFT VOICE MESSAGE FOR RYAN AT DR. Guy Sandifer OFFICE RE: POSITIVE ANTIBODIES W/  TYPE AND SCREEN.

## 2014-07-01 ENCOUNTER — Telehealth: Payer: Self-pay | Admitting: *Deleted

## 2014-07-01 ENCOUNTER — Ambulatory Visit (INDEPENDENT_AMBULATORY_CARE_PROVIDER_SITE_OTHER): Payer: Medicare Other | Admitting: Sports Medicine

## 2014-07-01 ENCOUNTER — Encounter: Payer: Self-pay | Admitting: Sports Medicine

## 2014-07-01 VITALS — BP 120/61 | HR 71 | Wt 142.0 lb

## 2014-07-01 DIAGNOSIS — I2510 Atherosclerotic heart disease of native coronary artery without angina pectoris: Secondary | ICD-10-CM

## 2014-07-01 DIAGNOSIS — M1712 Unilateral primary osteoarthritis, left knee: Secondary | ICD-10-CM

## 2014-07-01 LAB — HEMOGLOBIN A1C
Hgb A1c MFr Bld: 5.9 % — ABNORMAL HIGH (ref 4.8–5.6)
MEAN PLASMA GLUCOSE: 123 mg/dL

## 2014-07-01 MED ORDER — MAGNESIUM SULFATE 50 % IJ SOLN
40.0000 meq | INTRAMUSCULAR | Status: DC
  Filled 2014-07-01: qty 10

## 2014-07-01 MED ORDER — NITROGLYCERIN IN D5W 200-5 MCG/ML-% IV SOLN
2.0000 ug/min | INTRAVENOUS | Status: DC
  Filled 2014-07-01: qty 250

## 2014-07-01 MED ORDER — PHENYLEPHRINE HCL 10 MG/ML IJ SOLN
30.0000 ug/min | INTRAVENOUS | Status: DC
  Filled 2014-07-01: qty 2

## 2014-07-01 MED ORDER — DEXTROSE 5 % IV SOLN
0.0000 ug/min | INTRAVENOUS | Status: DC
  Filled 2014-07-01: qty 4

## 2014-07-01 MED ORDER — NOREPINEPHRINE BITARTRATE 1 MG/ML IV SOLN
0.0000 ug/min | INTRAVENOUS | Status: DC
  Filled 2014-07-01: qty 4

## 2014-07-01 MED ORDER — CEFUROXIME SODIUM 1.5 G IJ SOLR
1.5000 g | INTRAMUSCULAR | Status: AC
  Administered 2014-07-02: 1.5 g via INTRAVENOUS
  Filled 2014-07-01 (×2): qty 1.5

## 2014-07-01 MED ORDER — DEXMEDETOMIDINE HCL IN NACL 400 MCG/100ML IV SOLN
0.1000 ug/kg/h | INTRAVENOUS | Status: AC
  Administered 2014-07-02: .4 ug/kg/h via INTRAVENOUS
  Filled 2014-07-01: qty 100

## 2014-07-01 MED ORDER — VANCOMYCIN HCL 10 G IV SOLR
1250.0000 mg | INTRAVENOUS | Status: AC
  Administered 2014-07-02: 1250 mg via INTRAVENOUS
  Filled 2014-07-01: qty 1250

## 2014-07-01 MED ORDER — POTASSIUM CHLORIDE 2 MEQ/ML IV SOLN
80.0000 meq | INTRAVENOUS | Status: DC
  Filled 2014-07-01: qty 40

## 2014-07-01 MED ORDER — INSULIN REGULAR HUMAN 100 UNIT/ML IJ SOLN
INTRAMUSCULAR | Status: DC
  Filled 2014-07-01: qty 2.5

## 2014-07-01 MED ORDER — HEPARIN SODIUM (PORCINE) 1000 UNIT/ML IJ SOLN
INTRAMUSCULAR | Status: DC
  Filled 2014-07-01: qty 30

## 2014-07-01 MED ORDER — DOPAMINE-DEXTROSE 3.2-5 MG/ML-% IV SOLN
0.0000 ug/kg/min | INTRAVENOUS | Status: DC
  Filled 2014-07-01: qty 250

## 2014-07-01 NOTE — Assessment & Plan Note (Signed)
Aspiration and lidocaine injection as above, cannot use steroids due to upcoming aortic valve replacement tomorrow.

## 2014-07-01 NOTE — Anesthesia Preprocedure Evaluation (Addendum)
Anesthesia Evaluation  Patient identified by MRN, date of birth, ID band Patient awake    Reviewed: Allergy & Precautions, NPO status , Patient's Chart, lab work & pertinent test results  Airway Mallampati: I  TM Distance: >3 FB Neck ROM: Limited    Dental  (+) Edentulous Upper, Edentulous Lower   Pulmonary shortness of breath, COPD COPD inhaler, former smoker,  breath sounds clear to auscultation        Cardiovascular hypertension, + angina + CAD, + CABG, + Peripheral Vascular Disease and +CHF + Valvular Problems/Murmurs AS and MR Rhythm:Regular Rate:Normal     Neuro/Psych CVA    GI/Hepatic Neg liver ROS, GERD-  ,Zenkers diverticulum   Endo/Other  Chronically Hyponatremic  Renal/GU CRFRenal disease     Musculoskeletal  (+) Arthritis -,   Abdominal   Peds  Hematology  (+) anemia , Hgb 11.8- pt with antibodies. T&C for 4 units   Anesthesia Other Findings   Reproductive/Obstetrics                           Lab Results  Component Value Date   CREATININE 1.34* 06/30/2014   BUN 21* 06/30/2014   NA 127* 07/02/2014   K 4.2 07/02/2014   CL 99* 06/30/2014   CO2 18* 06/30/2014    Lab Results  Component Value Date   WBC 6.4 06/30/2014   HGB 10.9* 07/02/2014   HCT 32.0* 07/02/2014   MCV 90.2 06/30/2014   PLT 248 06/30/2014    Anesthesia Physical Anesthesia Plan  ASA: IV  Anesthesia Plan: General   Post-op Pain Management:    Induction: Intravenous  Airway Management Planned: Oral ETT  Additional Equipment: Arterial line, PA Cath, Ultrasound Guidance Line Placement and CVP  Intra-op Plan:   Post-operative Plan: Extubation in OR and Possible Post-op intubation/ventilation  Informed Consent: I have reviewed the patients History and Physical, chart, labs and discussed the procedure including the risks, benefits and alternatives for the proposed anesthesia with the patient or  authorized representative who has indicated his/her understanding and acceptance.     Plan Discussed with: CRNA  Anesthesia Plan Comments:        Anesthesia Quick Evaluation

## 2014-07-01 NOTE — Telephone Encounter (Signed)
Blood Bank from [facility] called stating patient's blood work showed +Antibody Screen, so they need a little more time to find acceptable blood to have on hold/standby for patient's procedure tomorrow. Blood bank needs to know if 2 units are enough to have on standby or do they need more. Will route to Dr. Burt Knack and Dr. Stanford Breed to advise.

## 2014-07-01 NOTE — Telephone Encounter (Signed)
This has been completed. Encounter will be closed.

## 2014-07-01 NOTE — Progress Notes (Signed)
  Subjective:    CC: follow-up  HPI: Systolic heart failure: Breathing has improved with Lasix, it seems he is off of digoxin, he is scheduled for aortic valve replacement tomorrow. I wished him luck.  Left knee osteoarthritis: Significant swelling, desires aspiration, cannot have steroids for several weeks before and after the aortic valve replacement.  Past medical history, Surgical history, Family history not pertinant except as noted below, Social history, Allergies, and medications have been entered into the medical record, reviewed, and no changes needed.   Review of Systems: No fevers, chills, night sweats, weight loss, chest pain, or shortness of breath.   Objective:    General: Well Developed, well nourished, and in no acute distress.  Neuro: Alert and oriented x3, extra-ocular muscles intact, sensation grossly intact.  HEENT: Normocephalic, atraumatic, pupils equal round reactive to light, neck supple, no masses, no lymphadenopathy, thyroid nonpalpable.  Skin: Warm and dry, no rashes. Cardiac: Regular rate and rhythm, no rubs or gallops, no lower extremity edema.  Respiratory: Clear to auscultation bilaterally. Not using accessory muscles, speaking in full sentences.  Procedure: Real-time Ultrasound Guided Injection of left knee Device: GE Logiq E  Verbal informed consent obtained.  Time-out conducted.  Noted no overlying erythema, induration, or other signs of local infection.  Skin prepped in a sterile fashion.  Local anesthesia: Topical Ethyl chloride.  With sterile technique and under real time ultrasound guidance:  25 mL straw-colored fluid aspirated, syringe switched and 3 mL lidocaine, 3 mL Marcaine injected easily. Completed without difficulty  Pain immediately resolved suggesting accurate placement of the medication.  Advised to call if fevers/chills, erythema, induration, drainage, or persistent bleeding.  Images permanently stored and available for review in the  ultrasound unit.  Impression: Technically successful ultrasound guided injection.  Impression and Recommendations:

## 2014-07-01 NOTE — Telephone Encounter (Signed)
Will need 4 units. Levonne Spiller, RN will call blood bank to let them know. thx

## 2014-07-01 NOTE — Telephone Encounter (Signed)
Will forward to Dr Burt Knack. [name]

## 2014-07-02 ENCOUNTER — Inpatient Hospital Stay (HOSPITAL_COMMUNITY)
Admission: RE | Admit: 2014-07-02 | Discharge: 2014-07-06 | DRG: 266 | Disposition: A | Discharge status: Home Health | Payer: Medicare Other | Source: Ambulatory Visit | Attending: Cardiovascular Disease | Admitting: Cardiovascular Disease

## 2014-07-02 ENCOUNTER — Inpatient Hospital Stay (HOSPITAL_COMMUNITY): Payer: Medicare Other | Admitting: Anesthesiology

## 2014-07-02 ENCOUNTER — Encounter (HOSPITAL_COMMUNITY)
Admission: RE | Disposition: A | Discharge status: Home Health | Payer: Self-pay | Source: Ambulatory Visit | Attending: Cardiovascular Disease

## 2014-07-02 ENCOUNTER — Inpatient Hospital Stay (HOSPITAL_COMMUNITY): Payer: Medicare Other

## 2014-07-02 ENCOUNTER — Encounter (HOSPITAL_COMMUNITY): Payer: Self-pay | Admitting: Anesthesiology

## 2014-07-02 DIAGNOSIS — I708 Atherosclerosis of other arteries: Secondary | ICD-10-CM | POA: Diagnosis present

## 2014-07-02 DIAGNOSIS — E785 Hyperlipidemia, unspecified: Secondary | ICD-10-CM | POA: Diagnosis present

## 2014-07-02 DIAGNOSIS — I129 Hypertensive chronic kidney disease with stage 1 through stage 4 chronic kidney disease, or unspecified chronic kidney disease: Secondary | ICD-10-CM | POA: Diagnosis present

## 2014-07-02 DIAGNOSIS — Z7982 Long term (current) use of aspirin: Secondary | ICD-10-CM | POA: Diagnosis not present

## 2014-07-02 DIAGNOSIS — I350 Nonrheumatic aortic (valve) stenosis: Secondary | ICD-10-CM

## 2014-07-02 DIAGNOSIS — D649 Anemia, unspecified: Secondary | ICD-10-CM | POA: Diagnosis present

## 2014-07-02 DIAGNOSIS — J449 Chronic obstructive pulmonary disease, unspecified: Secondary | ICD-10-CM | POA: Diagnosis present

## 2014-07-02 DIAGNOSIS — I080 Rheumatic disorders of both mitral and aortic valves: Principal | ICD-10-CM | POA: Diagnosis present

## 2014-07-02 DIAGNOSIS — K219 Gastro-esophageal reflux disease without esophagitis: Secondary | ICD-10-CM | POA: Diagnosis present

## 2014-07-02 DIAGNOSIS — E871 Hypo-osmolality and hyponatremia: Secondary | ICD-10-CM | POA: Diagnosis present

## 2014-07-02 DIAGNOSIS — Z87891 Personal history of nicotine dependence: Secondary | ICD-10-CM | POA: Diagnosis not present

## 2014-07-02 DIAGNOSIS — Z79899 Other long term (current) drug therapy: Secondary | ICD-10-CM | POA: Diagnosis not present

## 2014-07-02 DIAGNOSIS — Z8673 Personal history of transient ischemic attack (TIA), and cerebral infarction without residual deficits: Secondary | ICD-10-CM

## 2014-07-02 DIAGNOSIS — I714 Abdominal aortic aneurysm, without rupture: Secondary | ICD-10-CM | POA: Diagnosis present

## 2014-07-02 DIAGNOSIS — I272 Other secondary pulmonary hypertension: Secondary | ICD-10-CM | POA: Diagnosis present

## 2014-07-02 DIAGNOSIS — I25110 Atherosclerotic heart disease of native coronary artery with unstable angina pectoris: Secondary | ICD-10-CM | POA: Diagnosis present

## 2014-07-02 DIAGNOSIS — I5033 Acute on chronic diastolic (congestive) heart failure: Secondary | ICD-10-CM | POA: Diagnosis not present

## 2014-07-02 DIAGNOSIS — Z7902 Long term (current) use of antithrombotics/antiplatelets: Secondary | ICD-10-CM | POA: Diagnosis not present

## 2014-07-02 DIAGNOSIS — Z954 Presence of other heart-valve replacement: Secondary | ICD-10-CM | POA: Diagnosis not present

## 2014-07-02 DIAGNOSIS — I25710 Atherosclerosis of autologous vein coronary artery bypass graft(s) with unstable angina pectoris: Secondary | ICD-10-CM | POA: Diagnosis present

## 2014-07-02 DIAGNOSIS — N183 Chronic kidney disease, stage 3 unspecified: Secondary | ICD-10-CM

## 2014-07-02 DIAGNOSIS — Z006 Encounter for examination for normal comparison and control in clinical research program: Secondary | ICD-10-CM

## 2014-07-02 DIAGNOSIS — K224 Dyskinesia of esophagus: Secondary | ICD-10-CM | POA: Diagnosis present

## 2014-07-02 DIAGNOSIS — K225 Diverticulum of esophagus, acquired: Secondary | ICD-10-CM | POA: Diagnosis present

## 2014-07-02 DIAGNOSIS — Z952 Presence of prosthetic heart valve: Secondary | ICD-10-CM

## 2014-07-02 DIAGNOSIS — I25810 Atherosclerosis of coronary artery bypass graft(s) without angina pectoris: Secondary | ICD-10-CM | POA: Diagnosis present

## 2014-07-02 DIAGNOSIS — I5023 Acute on chronic systolic (congestive) heart failure: Secondary | ICD-10-CM | POA: Diagnosis not present

## 2014-07-02 DIAGNOSIS — I509 Heart failure, unspecified: Secondary | ICD-10-CM

## 2014-07-02 DIAGNOSIS — I10 Essential (primary) hypertension: Secondary | ICD-10-CM | POA: Diagnosis present

## 2014-07-02 DIAGNOSIS — Z7951 Long term (current) use of inhaled steroids: Secondary | ICD-10-CM | POA: Diagnosis not present

## 2014-07-02 DIAGNOSIS — I5042 Chronic combined systolic (congestive) and diastolic (congestive) heart failure: Secondary | ICD-10-CM | POA: Diagnosis present

## 2014-07-02 HISTORY — DX: Presence of prosthetic heart valve: Z95.2

## 2014-07-02 HISTORY — PX: TRANSCATHETER AORTIC VALVE REPLACEMENT, TRANSFEMORAL: SHX6400

## 2014-07-02 HISTORY — PX: TEE WITHOUT CARDIOVERSION: SHX5443

## 2014-07-02 LAB — CBC
HCT: 27.8 % — ABNORMAL LOW (ref 39.0–52.0)
HEMATOCRIT: 28.4 % — AB (ref 39.0–52.0)
Hemoglobin: 9.7 g/dL — ABNORMAL LOW (ref 13.0–17.0)
Hemoglobin: 9.9 g/dL — ABNORMAL LOW (ref 13.0–17.0)
MCH: 32.7 pg (ref 26.0–34.0)
MCH: 33.1 pg (ref 26.0–34.0)
MCHC: 34.9 g/dL (ref 30.0–36.0)
MCHC: 34.9 g/dL (ref 30.0–36.0)
MCV: 93.6 fL (ref 78.0–100.0)
MCV: 95 fL (ref 78.0–100.0)
PLATELETS: 194 10*3/uL (ref 150–400)
Platelets: 186 10*3/uL (ref 150–400)
RBC: 2.97 MIL/uL — AB (ref 4.22–5.81)
RBC: 2.99 MIL/uL — ABNORMAL LOW (ref 4.22–5.81)
RDW: 15.6 % — AB (ref 11.5–15.5)
RDW: 16.2 % — AB (ref 11.5–15.5)
WBC: 7.3 10*3/uL (ref 4.0–10.5)
WBC: 6.4 10*3/uL (ref 4.0–10.5)

## 2014-07-02 LAB — POCT I-STAT, CHEM 8
BUN: 26 mg/dL — ABNORMAL HIGH (ref 6–20)
BUN: 26 mg/dL — ABNORMAL HIGH (ref 6–20)
BUN: 26 mg/dL — ABNORMAL HIGH (ref 6–20)
BUN: 24 mg/dL — ABNORMAL HIGH (ref 6–20)
CALCIUM ION: 1.23 mmol/L (ref 1.13–1.30)
CREATININE: 1.3 mg/dL — AB (ref 0.61–1.24)
Calcium, Ion: 1.24 mmol/L (ref 1.13–1.30)
Calcium, Ion: 1.24 mmol/L (ref 1.13–1.30)
Calcium, Ion: 1.23 mmol/L (ref 1.13–1.30)
Chloride: 101 mmol/L (ref 101–111)
Chloride: 100 mmol/L — ABNORMAL LOW (ref 101–111)
Chloride: 97 mmol/L — ABNORMAL LOW (ref 101–111)
Chloride: 100 mmol/L — ABNORMAL LOW (ref 101–111)
Creatinine, Ser: 1.3 mg/dL — ABNORMAL HIGH (ref 0.61–1.24)
Creatinine, Ser: 1.2 mg/dL (ref 0.61–1.24)
Creatinine, Ser: 1.2 mg/dL (ref 0.61–1.24)
GLUCOSE: 101 mg/dL — AB (ref 65–99)
GLUCOSE: 128 mg/dL — AB (ref 65–99)
Glucose, Bld: 114 mg/dL — ABNORMAL HIGH (ref 65–99)
Glucose, Bld: 102 mg/dL — ABNORMAL HIGH (ref 65–99)
HCT: 30 % — ABNORMAL LOW (ref 39.0–52.0)
HCT: 27 % — ABNORMAL LOW (ref 39.0–52.0)
HEMATOCRIT: 24 % — AB (ref 39.0–52.0)
HEMATOCRIT: 24 % — AB (ref 39.0–52.0)
Hemoglobin: 10.2 g/dL — ABNORMAL LOW (ref 13.0–17.0)
Hemoglobin: 8.2 g/dL — ABNORMAL LOW (ref 13.0–17.0)
Hemoglobin: 8.2 g/dL — ABNORMAL LOW (ref 13.0–17.0)
Hemoglobin: 9.2 g/dL — ABNORMAL LOW (ref 13.0–17.0)
POTASSIUM: 3.9 mmol/L (ref 3.5–5.1)
Potassium: 4.2 mmol/L (ref 3.5–5.1)
Potassium: 4.1 mmol/L (ref 3.5–5.1)
Potassium: 4.2 mmol/L (ref 3.5–5.1)
SODIUM: 130 mmol/L — AB (ref 135–145)
Sodium: 130 mmol/L — ABNORMAL LOW (ref 135–145)
Sodium: 128 mmol/L — ABNORMAL LOW (ref 135–145)
Sodium: 128 mmol/L — ABNORMAL LOW (ref 135–145)
TCO2: 19 mmol/L (ref 0–100)
TCO2: 19 mmol/L (ref 0–100)
TCO2: 19 mmol/L (ref 0–100)
TCO2: 18 mmol/L (ref 0–100)

## 2014-07-02 LAB — POCT I-STAT 3, ART BLOOD GAS (G3+)
Acid-base deficit: 6 mmol/L — ABNORMAL HIGH (ref 0.0–2.0)
Acid-base deficit: 5 mmol/L — ABNORMAL HIGH (ref 0.0–2.0)
Bicarbonate: 19.5 mEq/L — ABNORMAL LOW (ref 20.0–24.0)
Bicarbonate: 19.3 mEq/L — ABNORMAL LOW (ref 20.0–24.0)
O2 Saturation: 99 %
O2 Saturation: 91 %
PH ART: 7.299 — AB (ref 7.350–7.450)
PO2 ART: 58 mmHg — AB (ref 80.0–100.0)
Patient temperature: 35.8
TCO2: 21 mmol/L (ref 0–100)
TCO2: 20 mmol/L (ref 0–100)
pCO2 arterial: 39.7 mmHg (ref 35.0–45.0)
pCO2 arterial: 31.8 mmHg — ABNORMAL LOW (ref 35.0–45.0)
pH, Arterial: 7.384 (ref 7.350–7.450)
pO2, Arterial: 146 mmHg — ABNORMAL HIGH (ref 80.0–100.0)

## 2014-07-02 LAB — POCT I-STAT 4, (NA,K, GLUC, HGB,HCT)
Glucose, Bld: 90 mg/dL (ref 65–99)
Glucose, Bld: 118 mg/dL — ABNORMAL HIGH (ref 65–99)
HCT: 25 % — ABNORMAL LOW (ref 39.0–52.0)
HEMATOCRIT: 32 % — AB (ref 39.0–52.0)
HEMOGLOBIN: 10.9 g/dL — AB (ref 13.0–17.0)
Hemoglobin: 8.5 g/dL — ABNORMAL LOW (ref 13.0–17.0)
Potassium: 4.2 mmol/L (ref 3.5–5.1)
Potassium: 4 mmol/L (ref 3.5–5.1)
SODIUM: 127 mmol/L — AB (ref 135–145)
Sodium: 130 mmol/L — ABNORMAL LOW (ref 135–145)

## 2014-07-02 LAB — PROTIME-INR
INR: 1.37 (ref 0.00–1.49)
Prothrombin Time: 17 seconds — ABNORMAL HIGH (ref 11.6–15.2)

## 2014-07-02 LAB — GLUCOSE, CAPILLARY
Glucose-Capillary: 105 mg/dL — ABNORMAL HIGH (ref 65–99)
Glucose-Capillary: 157 mg/dL — ABNORMAL HIGH (ref 65–99)

## 2014-07-02 LAB — PREPARE RBC (CROSSMATCH)

## 2014-07-02 LAB — TYPE AND SCREEN
ABO/RH(D): O POS
Antibody Screen: POSITIVE

## 2014-07-02 LAB — CREATININE, SERUM
Creatinine, Ser: 1.15 mg/dL (ref 0.61–1.24)
GFR calc Af Amer: >60 mL/min
GFR calc non Af Amer: 56 mL/min — ABNORMAL LOW

## 2014-07-02 LAB — MAGNESIUM: Magnesium: 1.2 mg/dL — ABNORMAL LOW (ref 1.7–2.4)

## 2014-07-02 LAB — APTT: APTT: 34 s (ref 24–37)

## 2014-07-02 SURGERY — IMPLANTATION, AORTIC VALVE, TRANSCATHETER, FEMORAL APPROACH
Anesthesia: General | Site: Chest

## 2014-07-02 MED ORDER — FENTANYL CITRATE (PF) 100 MCG/2ML IJ SOLN
INTRAMUSCULAR | Status: DC | PRN
Start: 2014-07-02 — End: 2014-07-02
  Administered 2014-07-02: 25 ug via INTRAVENOUS

## 2014-07-02 MED ORDER — SODIUM CHLORIDE 0.9 % IV SOLN
INTRAVENOUS | Status: DC
  Filled 2014-07-02: qty 2.5

## 2014-07-02 MED ORDER — ACETAMINOPHEN 325 MG PO TABS
650.0000 mg | ORAL_TABLET | Freq: Once | ORAL | Status: AC
  Administered 2014-07-02: 650 mg via ORAL

## 2014-07-02 MED ORDER — ARTIFICIAL TEARS OP OINT
TOPICAL_OINTMENT | OPHTHALMIC | Status: AC
  Filled 2014-07-02: qty 3.5

## 2014-07-02 MED ORDER — PROPOFOL 10 MG/ML IV BOLUS
INTRAVENOUS | Status: DC | PRN
  Administered 2014-07-02: 50 mg via INTRAVENOUS

## 2014-07-02 MED ORDER — NITROGLYCERIN IN D5W 200-5 MCG/ML-% IV SOLN
0.0000 ug/min | INTRAVENOUS | Status: DC
  Administered 2014-07-02: 20 ug/min via INTRAVENOUS

## 2014-07-02 MED ORDER — SUCCINYLCHOLINE CHLORIDE 20 MG/ML IJ SOLN
INTRAMUSCULAR | Status: AC
  Filled 2014-07-02: qty 1

## 2014-07-02 MED ORDER — SODIUM CHLORIDE 0.9 % IV SOLN
Freq: Once | INTRAVENOUS | Status: AC
  Administered 2014-07-02: 08:00:00 via INTRAVENOUS
  Filled 2014-07-02: qty 40

## 2014-07-02 MED ORDER — SODIUM CHLORIDE 0.9 % IR SOLN
Status: DC | PRN
  Administered 2014-07-02: 500 mL

## 2014-07-02 MED ORDER — CHLORHEXIDINE GLUCONATE 4 % EX LIQD
60.0000 mL | Freq: Once | CUTANEOUS | Status: DC
Start: 2014-07-03 — End: 2014-07-02

## 2014-07-02 MED ORDER — TIOTROPIUM BROMIDE MONOHYDRATE 18 MCG IN CAPS
18.0000 ug | ORAL_CAPSULE | Freq: Every day | RESPIRATORY_TRACT | Status: DC
  Administered 2014-07-02 – 2014-07-06 (×3): 18 ug via RESPIRATORY_TRACT
  Filled 2014-07-02: qty 5

## 2014-07-02 MED ORDER — DEXTROSE 5 % IV SOLN
1.5000 g | Freq: Two times a day (BID) | INTRAVENOUS | Status: AC
  Administered 2014-07-02 – 2014-07-04 (×4): 1.5 g via INTRAVENOUS
  Filled 2014-07-02 (×4): qty 1.5

## 2014-07-02 MED ORDER — SODIUM CHLORIDE 0.9 % IJ SOLN
3.0000 mL | Freq: Two times a day (BID) | INTRAMUSCULAR | Status: DC
  Administered 2014-07-02 – 2014-07-04 (×3): 3 mL via INTRAVENOUS

## 2014-07-02 MED ORDER — ALBUTEROL SULFATE (2.5 MG/3ML) 0.083% IN NEBU: 3.0000 mL | INHALATION_SOLUTION | RESPIRATORY_TRACT | Status: DC | PRN

## 2014-07-02 MED ORDER — SODIUM CHLORIDE 0.9 % IV SOLN: INTRAVENOUS | Status: DC

## 2014-07-02 MED ORDER — NEOSTIGMINE METHYLSULFATE 10 MG/10ML IV SOLN
INTRAVENOUS | Status: AC
  Filled 2014-07-02: qty 1

## 2014-07-02 MED ORDER — ONDANSETRON HCL 4 MG/2ML IJ SOLN
INTRAMUSCULAR | Status: AC
  Filled 2014-07-02: qty 2

## 2014-07-02 MED ORDER — ROCURONIUM BROMIDE 50 MG/5ML IV SOLN
INTRAVENOUS | Status: AC
  Filled 2014-07-02: qty 1

## 2014-07-02 MED ORDER — MUPIROCIN 2 % EX OINT
1.0000 "application " | TOPICAL_OINTMENT | Freq: Two times a day (BID) | CUTANEOUS | Status: DC
  Administered 2014-07-02 – 2014-07-06 (×8): 1 via NASAL

## 2014-07-02 MED ORDER — SODIUM CHLORIDE 0.9 % IJ SOLN
INTRAMUSCULAR | Status: AC
  Filled 2014-07-02: qty 20

## 2014-07-02 MED ORDER — LACTATED RINGERS IV SOLN
INTRAVENOUS | Status: DC | PRN
  Administered 2014-07-02 (×2): via INTRAVENOUS

## 2014-07-02 MED ORDER — LIDOCAINE HCL (CARDIAC) 20 MG/ML IV SOLN
INTRAVENOUS | Status: AC
  Filled 2014-07-02: qty 15

## 2014-07-02 MED ORDER — FENTANYL CITRATE (PF) 250 MCG/5ML IJ SOLN
INTRAMUSCULAR | Status: AC
  Filled 2014-07-02: qty 5

## 2014-07-02 MED ORDER — ONDANSETRON HCL 4 MG/2ML IJ SOLN: 4.0000 mg | Freq: Four times a day (QID) | INTRAMUSCULAR | Status: DC | PRN

## 2014-07-02 MED ORDER — ALBUMIN HUMAN 5 % IV SOLN
INTRAVENOUS | Status: DC | PRN
  Administered 2014-07-02: 07:00:00 via INTRAVENOUS

## 2014-07-02 MED ORDER — ALLOPURINOL 100 MG PO TABS
100.0000 mg | ORAL_TABLET | Freq: Every day | ORAL | Status: DC
  Administered 2014-07-03 – 2014-07-06 (×4): 100 mg via ORAL
  Filled 2014-07-02 (×4): qty 1

## 2014-07-02 MED ORDER — TRAMADOL HCL 50 MG PO TABS
50.0000 mg | ORAL_TABLET | ORAL | Status: DC | PRN
  Administered 2014-07-02: 50 mg via ORAL
  Filled 2014-07-02: qty 1

## 2014-07-02 MED ORDER — ACETAMINOPHEN 650 MG RE SUPP: 650.0000 mg | Freq: Once | RECTAL | Status: DC

## 2014-07-02 MED ORDER — GLYCOPYRROLATE 0.2 MG/ML IJ SOLN
INTRAMUSCULAR | Status: DC | PRN
  Administered 2014-07-02: 0.6 mg via INTRAVENOUS

## 2014-07-02 MED ORDER — PANTOPRAZOLE SODIUM 40 MG PO TBEC
40.0000 mg | DELAYED_RELEASE_TABLET | Freq: Every day | ORAL | Status: DC
  Administered 2014-07-04 – 2014-07-06 (×3): 40 mg via ORAL
  Filled 2014-07-02 (×3): qty 1

## 2014-07-02 MED ORDER — HEPARIN SODIUM (PORCINE) 1000 UNIT/ML IJ SOLN
INTRAMUSCULAR | Status: AC
  Filled 2014-07-02: qty 2

## 2014-07-02 MED ORDER — SODIUM CHLORIDE 0.9 % IJ SOLN: 3.0000 mL | INTRAMUSCULAR | Status: DC | PRN

## 2014-07-02 MED ORDER — ATORVASTATIN CALCIUM 40 MG PO TABS
40.0000 mg | ORAL_TABLET | Freq: Every day | ORAL | Status: DC
  Administered 2014-07-02 – 2014-07-05 (×4): 40 mg via ORAL
  Filled 2014-07-02 (×5): qty 1

## 2014-07-02 MED ORDER — ACETAMINOPHEN 160 MG/5ML PO SOLN
1000.0000 mg | Freq: Four times a day (QID) | ORAL | Status: DC
  Administered 2014-07-05: 1000 mg
  Filled 2014-07-02: qty 40

## 2014-07-02 MED ORDER — SODIUM CHLORIDE 0.9 % IV SOLN: 250.0000 mL | INTRAVENOUS | Status: DC | PRN

## 2014-07-02 MED ORDER — BUDESONIDE-FORMOTEROL FUMARATE 160-4.5 MCG/ACT IN AERO
2.0000 | INHALATION_SPRAY | Freq: Two times a day (BID) | RESPIRATORY_TRACT | Status: DC
  Administered 2014-07-02 – 2014-07-06 (×7): 2 via RESPIRATORY_TRACT
  Filled 2014-07-02: qty 6

## 2014-07-02 MED ORDER — INSULIN ASPART 100 UNIT/ML ~~LOC~~ SOLN
0.0000 [IU] | SUBCUTANEOUS | Status: DC
  Administered 2014-07-02: 3 [IU] via SUBCUTANEOUS
  Administered 2014-07-03: 2 [IU] via SUBCUTANEOUS

## 2014-07-02 MED ORDER — FENTANYL CITRATE (PF) 100 MCG/2ML IJ SOLN: 25.0000 ug | INTRAMUSCULAR | Status: DC | PRN

## 2014-07-02 MED ORDER — REMIFENTANIL HCL 1 MG IV SOLR
INTRAVENOUS | Status: DC | PRN
  Administered 2014-07-02: .375 ug/kg/min via INTRAVENOUS

## 2014-07-02 MED ORDER — CLOPIDOGREL BISULFATE 75 MG PO TABS
75.0000 mg | ORAL_TABLET | Freq: Every day | ORAL | Status: DC
  Administered 2014-07-03 – 2014-07-06 (×4): 75 mg via ORAL
  Filled 2014-07-02 (×5): qty 1

## 2014-07-02 MED ORDER — MUSCLE RUB 10-15 % EX CREA
TOPICAL_CREAM | Freq: Three times a day (TID) | CUTANEOUS | Status: DC
  Administered 2014-07-02 – 2014-07-03 (×3): via TOPICAL
  Filled 2014-07-02: qty 85

## 2014-07-02 MED ORDER — VANCOMYCIN HCL IN DEXTROSE 1-5 GM/200ML-% IV SOLN
1000.0000 mg | Freq: Once | INTRAVENOUS | Status: AC
  Administered 2014-07-02: 1000 mg via INTRAVENOUS
  Filled 2014-07-02: qty 200

## 2014-07-02 MED ORDER — IODIXANOL 320 MG/ML IV SOLN
INTRAVENOUS | Status: DC | PRN
  Administered 2014-07-02: 91.4 mL via INTRAVENOUS

## 2014-07-02 MED ORDER — 0.9 % SODIUM CHLORIDE (POUR BTL) OPTIME
TOPICAL | Status: DC | PRN
  Administered 2014-07-02: 1000 mL

## 2014-07-02 MED ORDER — PROTAMINE SULFATE 10 MG/ML IV SOLN
INTRAVENOUS | Status: DC | PRN
  Administered 2014-07-02: 10 mg via INTRAVENOUS
  Administered 2014-07-02: 60 mg via INTRAVENOUS
  Administered 2014-07-02: 40 mg via INTRAVENOUS

## 2014-07-02 MED ORDER — MUPIROCIN 2 % EX OINT
TOPICAL_OINTMENT | CUTANEOUS | Status: AC
  Administered 2014-07-02 (×2): 1 via NASAL
  Filled 2014-07-02: qty 22

## 2014-07-02 MED ORDER — LIDOCAINE HCL (CARDIAC) 20 MG/ML IV SOLN
INTRAVENOUS | Status: DC | PRN
  Administered 2014-07-02: 100 mg via INTRAVENOUS

## 2014-07-02 MED ORDER — MAGNESIUM SULFATE 4 GM/100ML IV SOLN
4.0000 g | Freq: Once | INTRAVENOUS | Status: AC
  Administered 2014-07-02: 4 g via INTRAVENOUS
  Filled 2014-07-02: qty 100

## 2014-07-02 MED ORDER — CAPSAICIN 0.025 % EX CREA
TOPICAL_CREAM | Freq: Three times a day (TID) | CUTANEOUS | Status: DC
  Filled 2014-07-02: qty 56.6

## 2014-07-02 MED ORDER — EPHEDRINE SULFATE 50 MG/ML IJ SOLN
INTRAMUSCULAR | Status: AC
  Filled 2014-07-02: qty 1

## 2014-07-02 MED ORDER — ROCURONIUM BROMIDE 100 MG/10ML IV SOLN
INTRAVENOUS | Status: DC | PRN
  Administered 2014-07-02: 30 mg via INTRAVENOUS

## 2014-07-02 MED ORDER — HEPARIN SODIUM (PORCINE) 1000 UNIT/ML IJ SOLN
INTRAMUSCULAR | Status: DC | PRN
  Administered 2014-07-02: 11000 [IU] via INTRAVENOUS

## 2014-07-02 MED ORDER — MIDAZOLAM HCL 2 MG/2ML IJ SOLN: 2.0000 mg | INTRAMUSCULAR | Status: DC | PRN

## 2014-07-02 MED ORDER — PHENYLEPHRINE HCL 10 MG/ML IJ SOLN
20.0000 mg | INTRAVENOUS | Status: DC | PRN
  Administered 2014-07-02: 25 ug/min via INTRAVENOUS

## 2014-07-02 MED ORDER — DEXMEDETOMIDINE HCL IN NACL 200 MCG/50ML IV SOLN: 0.1000 ug/kg/h | INTRAVENOUS | Status: DC

## 2014-07-02 MED ORDER — PHENYLEPHRINE 40 MCG/ML (10ML) SYRINGE FOR IV PUSH (FOR BLOOD PRESSURE SUPPORT)
PREFILLED_SYRINGE | INTRAVENOUS | Status: AC
  Filled 2014-07-02: qty 10

## 2014-07-02 MED ORDER — GLYCOPYRROLATE 0.2 MG/ML IJ SOLN
INTRAMUSCULAR | Status: AC
  Filled 2014-07-02: qty 3

## 2014-07-02 MED ORDER — FAMOTIDINE IN NACL 20-0.9 MG/50ML-% IV SOLN
20.0000 mg | INTRAVENOUS | Status: AC
  Administered 2014-07-02: 20 mg via INTRAVENOUS

## 2014-07-02 MED ORDER — PROPOFOL 10 MG/ML IV BOLUS
INTRAVENOUS | Status: AC
  Filled 2014-07-02: qty 20

## 2014-07-02 MED ORDER — FLUTICASONE PROPIONATE 50 MCG/ACT NA SUSP
1.0000 | Freq: Every day | NASAL | Status: DC
  Administered 2014-07-04 – 2014-07-06 (×3): 1 via NASAL
  Filled 2014-07-02: qty 16

## 2014-07-02 MED ORDER — CHLORHEXIDINE GLUCONATE 4 % EX LIQD: 60.0000 mL | Freq: Once | CUTANEOUS | Status: DC

## 2014-07-02 MED ORDER — CAPSAICIN-MENTHOL 0.025-1.25 % EX PTCH
1.0000 | MEDICATED_PATCH | Freq: Three times a day (TID) | CUTANEOUS | Status: DC
  Filled 2014-07-02 (×2): qty 1

## 2014-07-02 MED ORDER — PHENYLEPHRINE HCL 10 MG/ML IJ SOLN
0.0000 ug/min | INTRAVENOUS | Status: DC
  Filled 2014-07-02: qty 2

## 2014-07-02 MED ORDER — INSULIN REGULAR BOLUS VIA INFUSION
0.0000 [IU] | Freq: Three times a day (TID) | INTRAVENOUS | Status: DC
  Filled 2014-07-02: qty 10

## 2014-07-02 MED ORDER — SODIUM CHLORIDE 0.9 % IV SOLN
1.0000 mL/kg/h | INTRAVENOUS | Status: AC
  Administered 2014-07-02: 1 mL/kg/h via INTRAVENOUS

## 2014-07-02 MED ORDER — MILRINONE IN DEXTROSE 20 MG/100ML IV SOLN
0.2500 ug/kg/min | INTRAVENOUS | Status: AC
  Administered 2014-07-02: .3 ug/kg/min via INTRAVENOUS
  Filled 2014-07-02: qty 100

## 2014-07-02 MED ORDER — LACTATED RINGERS IV SOLN: 500.0000 mL | Freq: Once | INTRAVENOUS | Status: AC | PRN

## 2014-07-02 MED ORDER — CHLORHEXIDINE GLUCONATE CLOTH 2 % EX PADS
6.0000 | MEDICATED_PAD | Freq: Every day | CUTANEOUS | Status: DC
  Administered 2014-07-03 – 2014-07-06 (×4): 6 via TOPICAL

## 2014-07-02 MED ORDER — MUPIROCIN 2 % EX OINT: 1.0000 "application " | TOPICAL_OINTMENT | Freq: Two times a day (BID) | CUTANEOUS | Status: DC

## 2014-07-02 MED ORDER — ASPIRIN 81 MG PO TABS
81.0000 mg | ORAL_TABLET | Freq: Every day | ORAL | Status: DC
  Administered 2014-07-03: 81 mg via ORAL
  Filled 2014-07-02 (×4): qty 1

## 2014-07-02 MED ORDER — NEOSTIGMINE METHYLSULFATE 10 MG/10ML IV SOLN
INTRAVENOUS | Status: DC | PRN
  Administered 2014-07-02: 4 mg via INTRAVENOUS

## 2014-07-02 MED ORDER — ACETAMINOPHEN 160 MG/5ML PO SOLN: 650.0000 mg | Freq: Once | ORAL | Status: DC

## 2014-07-02 MED ORDER — MILRINONE IN DEXTROSE 20 MG/100ML IV SOLN
0.2500 ug/kg/min | INTRAVENOUS | Status: AC
  Administered 2014-07-03: 0.3 ug/kg/min via INTRAVENOUS
  Filled 2014-07-02 (×2): qty 100

## 2014-07-02 MED ORDER — ALBUMIN HUMAN 5 % IV SOLN: 250.0000 mL | INTRAVENOUS | Status: AC | PRN

## 2014-07-02 MED ORDER — CHLORHEXIDINE GLUCONATE 4 % EX LIQD: 30.0000 mL | CUTANEOUS | Status: DC

## 2014-07-02 MED ORDER — ONDANSETRON HCL 4 MG/2ML IJ SOLN
INTRAMUSCULAR | Status: DC | PRN
  Administered 2014-07-02: 4 mg via INTRAVENOUS

## 2014-07-02 MED ORDER — ACETAMINOPHEN 500 MG PO TABS
1000.0000 mg | ORAL_TABLET | Freq: Four times a day (QID) | ORAL | Status: DC
  Administered 2014-07-03 – 2014-07-06 (×11): 1000 mg via ORAL
  Filled 2014-07-02 (×18): qty 2

## 2014-07-02 MED ORDER — EPINEPHRINE HCL 1 MG/ML IJ SOLN
INTRAMUSCULAR | Status: AC
  Filled 2014-07-02: qty 1

## 2014-07-02 MED ORDER — TIOTROPIUM BROMIDE MONOHYDRATE 2.5 MCG/ACT IN AERS
2.5000 mg | INHALATION_SPRAY | RESPIRATORY_TRACT | Status: DC
Start: 2014-07-03 — End: 2014-07-02

## 2014-07-02 MED ORDER — NOREPINEPHRINE BITARTRATE 1 MG/ML IV SOLN
4000.0000 ug | INTRAVENOUS | Status: DC | PRN
  Administered 2014-07-02: 5 ug/min via INTRAVENOUS

## 2014-07-02 SURGICAL SUPPLY — 95 items
ARTERIAL PRESSURE LINE (MISCELLANEOUS) ×8 IMPLANT
ATTRACTOMAT 16X20 MAGNETIC DRP (DRAPES) IMPLANT
BAG BANDED W/RUBBER/TAPE 36X54 (MISCELLANEOUS) ×4 IMPLANT
BAG DECANTER FOR FLEXI CONT (MISCELLANEOUS) IMPLANT
BAG SNAP BAND KOVER 36X36 (MISCELLANEOUS) ×8 IMPLANT
BLADE 10 SAFETY STRL DISP (BLADE) ×4 IMPLANT
BLADE STERNUM SYSTEM 6 (BLADE) ×4 IMPLANT
BLADE SURG ROTATE 9660 (MISCELLANEOUS) IMPLANT
CABLE PACING FASLOC BIEGE (MISCELLANEOUS) ×4 IMPLANT
CABLE PACING FASLOC BLUE (MISCELLANEOUS) ×4 IMPLANT
CANISTER SUCTION 2500CC (MISCELLANEOUS) IMPLANT
CANNULA FEM VENOUS REMOTE 22FR (CANNULA) IMPLANT
CANNULA OPTISITE PERFUSION 16F (CANNULA) IMPLANT
CANNULA OPTISITE PERFUSION 18F (CANNULA) IMPLANT
CATH DIAG EXPO 6F FR4 (CATHETERS) ×16 IMPLANT
CATH S G BIP PACING (SET/KITS/TRAYS/PACK) ×4 IMPLANT
CATH SOFT-VU 4F 65 STRAIGHT (CATHETERS) ×2 IMPLANT
CATH SOFT-VU STRAIGHT 4F 65CM (CATHETERS) ×2
CATH SWAN GANZ 7FR (CATHETERS) ×4 IMPLANT
CLIP TI MEDIUM 24 (CLIP) ×4 IMPLANT
CLIP TI WIDE RED SMALL 24 (CLIP) ×4 IMPLANT
CONT SPEC 4OZ CLIKSEAL STRL BL (MISCELLANEOUS) ×12 IMPLANT
COVER DOME SNAP 22 D (MISCELLANEOUS) ×4 IMPLANT
COVER MAYO STAND STRL (DRAPES) ×4 IMPLANT
COVER TABLE BACK 60X90 (DRAPES) ×4 IMPLANT
CRADLE DONUT ADULT HEAD (MISCELLANEOUS) ×4 IMPLANT
DERMABOND ADVANCED (GAUZE/BANDAGES/DRESSINGS) ×2
DERMABOND ADVANCED .7 DNX12 (GAUZE/BANDAGES/DRESSINGS) ×2 IMPLANT
DRAPE INCISE IOBAN 66X45 STRL (DRAPES) IMPLANT
DRAPE SLUSH MACHINE 52X66 (DRAPES) ×4 IMPLANT
DRAPE TABLE COVER HEAVY DUTY (DRAPES) ×4 IMPLANT
DRSG TEGADERM 4X4.75 (GAUZE/BANDAGES/DRESSINGS) ×4 IMPLANT
ELECT REM PT RETURN 9FT ADLT (ELECTROSURGICAL) ×8
ELECTRODE REM PT RTRN 9FT ADLT (ELECTROSURGICAL) ×4 IMPLANT
FELT TEFLON 6X6 (MISCELLANEOUS) ×4 IMPLANT
FEMORAL VENOUS CANN RAP (CANNULA) IMPLANT
GAUZE SPONGE 4X4 12PLY STRL (GAUZE/BANDAGES/DRESSINGS) ×4 IMPLANT
GLOVE ECLIPSE 7.5 STRL STRAW (GLOVE) ×4 IMPLANT
GLOVE ECLIPSE 8.0 STRL XLNG CF (GLOVE) ×8 IMPLANT
GLOVE EUDERMIC 7 POWDERFREE (GLOVE) ×4 IMPLANT
GLOVE ORTHO TXT STRL SZ7.5 (GLOVE) ×4 IMPLANT
GOWN STRL REUS W/ TWL LRG LVL3 (GOWN DISPOSABLE) ×6 IMPLANT
GOWN STRL REUS W/ TWL XL LVL3 (GOWN DISPOSABLE) ×12 IMPLANT
GOWN STRL REUS W/TWL LRG LVL3 (GOWN DISPOSABLE) ×6
GOWN STRL REUS W/TWL XL LVL3 (GOWN DISPOSABLE) ×12
GUIDEWIRE SAF TJ AMPL .035X180 (WIRE) ×4 IMPLANT
GUIDEWIRE SAFE TJ AMPLATZ EXST (WIRE) ×4 IMPLANT
GUIDEWIRE STRAIGHT .035 260CM (WIRE) ×4 IMPLANT
GUIDEWIRE WHOLEY HI TOR 145CM (WIRE) ×4 IMPLANT
INSERT FOGARTY 61MM (MISCELLANEOUS) ×4 IMPLANT
INSERT FOGARTY SM (MISCELLANEOUS) ×8 IMPLANT
INSERT FOGARTY XLG (MISCELLANEOUS) IMPLANT
KIT BASIN OR (CUSTOM PROCEDURE TRAY) ×4 IMPLANT
KIT DILATOR VASC 18G NDL (KITS) IMPLANT
KIT HEART LEFT (KITS) ×4 IMPLANT
KIT ROOM TURNOVER OR (KITS) ×4 IMPLANT
KIT SUCTION CATH 14FR (SUCTIONS) ×8 IMPLANT
NEEDLE PERC 18GX7CM (NEEDLE) ×4 IMPLANT
NS IRRIG 1000ML POUR BTL (IV SOLUTION) ×12 IMPLANT
PACK AORTA (CUSTOM PROCEDURE TRAY) ×4 IMPLANT
PAD ARMBOARD 7.5X6 YLW CONV (MISCELLANEOUS) ×8 IMPLANT
PAD ELECT DEFIB RADIOL ZOLL (MISCELLANEOUS) ×4 IMPLANT
PATCH TACHOSII LRG 9.5X4.8 (VASCULAR PRODUCTS) IMPLANT
SHEATH PINNACLE 6F 10CM (SHEATH) ×4 IMPLANT
SPONGE GAUZE 4X4 12PLY STER LF (GAUZE/BANDAGES/DRESSINGS) ×4 IMPLANT
SPONGE LAP 4X18 X RAY DECT (DISPOSABLE) ×4 IMPLANT
STOPCOCK 4 WAY LG BORE MALE ST (IV SETS) ×4 IMPLANT
STOPCOCK MORSE 400PSI 3WAY (MISCELLANEOUS) ×12 IMPLANT
SUT ETHIBOND X763 2 0 SH 1 (SUTURE) ×4 IMPLANT
SUT GORETEX CV 4 TH 22 36 (SUTURE) ×4 IMPLANT
SUT GORETEX CV4 TH-18 (SUTURE) ×12 IMPLANT
SUT GORETEX TH-18 36 INCH (SUTURE) ×8 IMPLANT
SUT MNCRL AB 3-0 PS2 18 (SUTURE) ×4 IMPLANT
SUT PROLENE 3 0 SH1 36 (SUTURE) IMPLANT
SUT PROLENE 4 0 RB 1 (SUTURE) ×2
SUT PROLENE 4-0 RB1 .5 CRCL 36 (SUTURE) ×2 IMPLANT
SUT PROLENE 5 0 C 1 36 (SUTURE) ×8 IMPLANT
SUT PROLENE 6 0 C 1 30 (SUTURE) ×8 IMPLANT
SUT SILK  1 MH (SUTURE) ×2
SUT SILK 1 MH (SUTURE) ×2 IMPLANT
SUT SILK 2 0 SH CR/8 (SUTURE) IMPLANT
SUT VIC AB 2-0 CT1 27 (SUTURE) ×2
SUT VIC AB 2-0 CT1 TAPERPNT 27 (SUTURE) ×2 IMPLANT
SUT VIC AB 2-0 CTX 36 (SUTURE) IMPLANT
SUT VIC AB 3-0 SH 8-18 (SUTURE) ×8 IMPLANT
SYR 30ML LL (SYRINGE) ×8 IMPLANT
SYR 50ML LL SCALE MARK (SYRINGE) ×4 IMPLANT
SYR MEDRAD MARK V 150ML (SYRINGE) ×4 IMPLANT
TAPE CLOTH SURG 4X10 WHT LF (GAUZE/BANDAGES/DRESSINGS) ×4 IMPLANT
TOWEL OR 17X26 10 PK STRL BLUE (TOWEL DISPOSABLE) ×8 IMPLANT
TRAY FOLEY IC TEMP SENS 14FR (CATHETERS) ×4 IMPLANT
TUBE SUCT INTRACARD DLP 20F (MISCELLANEOUS) IMPLANT
TUBING HIGH PRESSURE 120CM (CONNECTOR) ×4 IMPLANT
VALVE HEART TRANSCATH SZ3 23MM (Prosthesis & Implant Heart) ×4 IMPLANT
WIRE AMPLATZ SS-J .035X180CM (WIRE) ×4 IMPLANT

## 2014-07-02 NOTE — Anesthesia Postprocedure Evaluation (Signed)
  Anesthesia Post-op Note  Patient: Eric Buchanan  Procedure(s) Performed: Procedure(s): TRANSCATHETER AORTIC VALVE REPLACEMENT, TRANSFEMORAL (N/A) TRANSESOPHAGEAL ECHOCARDIOGRAM (TEE) (N/A)  Patient Location: ICU  Anesthesia Type:General  Level of Consciousness: awake, alert  and oriented  Airway and Oxygen Therapy: Patient Spontanous Breathing  Post-op Pain: none  Post-op Assessment: Post-op Vital signs reviewed  Post-op Vital Signs: Reviewed  Last Vitals:  Filed Vitals:   07/02/14 0619  BP: 119/66  Pulse: 67  Temp: 36.8 C  Resp: 18    Complications: No apparent anesthesia complications

## 2014-07-02 NOTE — Transfer of Care (Signed)
Immediate Anesthesia Transfer of Care Note  Patient: Eric Buchanan  Procedure(s) Performed: Procedure(s): TRANSCATHETER AORTIC VALVE REPLACEMENT, TRANSFEMORAL (N/A) TRANSESOPHAGEAL ECHOCARDIOGRAM (TEE) (N/A)  Patient Location: ICU  Anesthesia Type:General  Level of Consciousness: awake, alert  and oriented  Airway & Oxygen Therapy: Patient Spontanous Breathing and Patient connected to nasal cannula oxygen  Post-op Assessment: Report given to RN and Post -op Vital signs reviewed and stable  Post vital signs: Reviewed and stable  Last Vitals:  Filed Vitals:   07/02/14 0619  BP: 119/66  Pulse: 67  Temp: 36.8 C  Resp: 18    Complications: No apparent anesthesia complications

## 2014-07-02 NOTE — Significant Event (Signed)
Elevated SBP in the 170-180 via A Line, whereas cuff pressure are in the 110-120s. Patient alert/oriented X 4. Dr. Burt Knack made aware and is at bedside. Received verbal order from MD to monitor via cuff pressure. MD removed temp pacer and gave verbal orders to remove left groin sheath, A-Line, and swan ganz. Cristiano Capri, Therapist, sports.

## 2014-07-02 NOTE — Anesthesia Procedure Notes (Addendum)
Procedure Name: Intubation Date/Time: 07/02/2014 8:06 AM Performed by: Suzy Bouchard Pre-anesthesia Checklist: Patient identified, Emergency Drugs available, Suction available, Patient being monitored and Timeout performed Patient Re-evaluated:Patient Re-evaluated prior to inductionOxygen Delivery Method: Circle system utilized Preoxygenation: Pre-oxygenation with 100% oxygen Intubation Type: IV induction Ventilation: Mask ventilation without difficulty and Oral airway inserted - appropriate to patient size Laryngoscope Size: Sabra Heck and 2 Grade View: Grade I Tube type: Oral Tube size: 7.5 mm Number of attempts: 1 Airway Equipment and Method: Stylet and LTA kit utilized Placement Confirmation: ETT inserted through vocal cords under direct vision,  breath sounds checked- equal and bilateral and positive ETCO2 Secured at: 22 cm Tube secured with: Tape Dental Injury: Teeth and Oropharynx as per pre-operative assessment

## 2014-07-02 NOTE — H&P (View-Only) (Signed)
[facility]       CARDIOTHORACIC SURGERY NOTE  Referring Provider is Stanford Breed, Denice Bors, MD PCP is Aundria Mems, MD   HPI:  Patient returns to the office today for follow-up of stage D severe symptomatic aortic stenosis with severe three-vessel coronary artery disease and vein graft disease, status post coronary artery bypass grafting in the remote past. He was originally seen in consultation on 06/02/2014. The patient presents with progressive symptoms of exertional shortness of breath and fatigue consistent with chronic combined systolic and diastolic congestive heart failure, currently New York Heart Association functional class 3-4 year the patient has also been having intermittent episodes of substernal chest pain consistent with angina pectoris. Transthoracic echocardiogram confirms presence of severe aortic stenosis and also documents the presence of moderate mitral regurgitation.  An attempt at transesophageal echocardiogram was aborted because of problems with attempts to cannulate the esophagus. 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  Cardiac catheterization confirms the presence of severe three-vessel coronary artery disease with continued patency of both bypass grafts placed at the time of the patient's original surgery in 1998. The patient does have some progression of distal coronary artery disease in all 3 major vascular territories, including 80% stenosis of the mid right coronary artery. Since the patient was originally seen in consultation he has undergone CT angiography and formal pulmonary function testing. He returns to the office today to discuss the results of these tests and contemplate treatment options further. Over the past 2 weeks he has been seen on 2 occasions by his primary care physician. A CT chest was performed to r/o PE and lasix was prescribed for  congestive heart failure.  His breathing has improved a little bit with oral diuretic therapy and his weight has come down more than 6 pounds. The patient has had a persistent nonproductive cough. He was prescribed a short course of azithromycin last week.  However, the patient continues to experience shortness of breath with very mild activity and he cannot lay flat in bed. He has also had some intermittent chest pains that have been for the most part associated with activity and probably relieved by rest. He did have one episode of chest pain yesterday that developed while he was reading the paper in the morning and lasted for nearly 15 minutes.   Current Outpatient Prescriptions  Medication Sig Dispense Refill  . albuterol (PROVENTIL HFA;VENTOLIN HFA) 108 (90 BASE) MCG/ACT inhaler Inhale 1 puff into the lungs every 4 (four) hours as needed for shortness of breath.     . allopurinol (ZYLOPRIM) 100 MG tablet Take 1 tablet (100 mg total) by mouth daily. 90 tablet 3  . aspirin 81 MG tablet Take 81 mg by mouth at bedtime.     Marland Kitchen atorvastatin (LIPITOR) 40 MG tablet Take 1 tablet (40 mg total) by mouth daily. 90 tablet 3  . azithromycin (ZITHROMAX Z-PAK) 250 MG tablet Take 2 tablets (500 mg) on  Day 1,  followed by 1 tablet (250 mg) once daily on Days 2 through 5. 6 tablet 0  . budesonide-formoterol (SYMBICORT) 160-4.5 MCG/ACT inhaler Inhale 2 puffs into the lungs 2 (two) times daily. 1 Inhaler 0  . Capsaicin-Menthol (SALONPAS GEL) 0.025-1.25 % PTCH Apply 1 patch topically 3 (three) times daily.    . Cholecalciferol (VITAMIN D) 2000 UNITS tablet Take 2,000 Units by mouth.    . esomeprazole (NEXIUM) 40 MG capsule Take 1  capsule (40 mg total) by mouth daily at 12 noon. 90 capsule 3  . fluticasone (FLONASE) 50 MCG/ACT nasal spray One spray in each nostril twice a day, use left hand for right nostril, and right hand for left nostril. 48 g 0  . furosemide (LASIX) 40 MG tablet Take 1 tablet (40 mg total) by  mouth daily. 5 tablet 0  . Glucosamine Sulfate-MSM 500-400 MG CAPS Take by mouth 3 (three) times daily.    Marland Kitchen lisinopril-hydrochlorothiazide (PRINZIDE,ZESTORETIC) 20-25 MG per tablet Take 0.5 tablets by mouth daily. 90 tablet 3  . oxyCODONE-acetaminophen (PERCOCET/ROXICET) 5-325 MG per tablet Take 0.5 tablets by mouth every 8 (eight) hours as needed. 45 tablet 0  . Tiotropium Bromide Monohydrate (SPIRIVA RESPIMAT) 2.5 MCG/ACT AERS Inhale 2.5 mg into the lungs every morning.     No current facility-administered medications for this visit.      Physical Exam:   BP 134/73 mmHg  Pulse 82  Resp 21  Ht 5\' 5"  (1.651 m)  Wt 141 lb (63.957 kg)  BMI 23.46 kg/m2  SpO2 98%  General:  Elderly and somewhat frail  Chest:   Fairly clear w/ a few scattered crackles, symmetrical breath sounds  CV:   RRR w/ systolic murmur best along sternal border  Incisions:  n/a  Abdomen:  Soft, non-distended, non-tender  Extremities:  Warm, well-perfused, trivial LE edema   Diagnostic Tests:  Cardiac TAVR CT  TECHNIQUE: The patient was scanned on a Philips 256 scanner. A 120 kV retrospective scan was triggered in the descending thoracic aorta at 111 HU's. Gantry rotation speed was 270 msecs and collimation was .9 mm. No beta blockade or nitro were given. The 3D data set was reconstructed in 5% intervals of the R-R cycle. Systolic and diastolic phases were analyzed on a dedicated work station using MPR, MIP and VRT modes. The patient received 80 cc of contrast.  FINDINGS: Aortic Valve: Trileaflet and heavily calcified. Bulky annular calcification at the base of the left leaflet and smaller amount of nodular calcification at the base of the non coronary leaflet  Sinotubular Junction: 24 mm  Ascending Thoracic Aorta: 29 mm  Aortic Arch: Not well seen on cardiac portion of exam  Descending Thoracic Aorta: 28 mm  Sinus of Valsalva Measurements:  Non-coronary: 31 mm  Right -coronary:  27 mm  Left -coronary: 29 mm  Coronary Artery Height above Annulus:  Left Main: 13.2 mm (but supplied by LIMA and SVG to OM  Right Coronary: 12.7 mm  Virtual Basal Annulus Measurements:  Maximum/Minimum Diameter: 26.5 mm x 20.7 mm  Perimeter: 75 mm  Area: 419 mm2  Coronary Arteries: Sufficient height above annulus for deployment Patent LIMA to LAD and patent SVG to OM2  Optimum Fluoroscopic Angle for Delivery: Essentially AP LAO 1 degree Cranial 0 degrees  IMPRESSION: 1) Calcified tri leaflet aortic valve suitable for 23 mm Sapien 3 valve Annular area 419 mm2  2) Significant bulky annular calcification at base of left cusp and less so at base of non coronary cusp that could increase risk of perivalvular regurgitation post deployment  3) Suitable coronary height above annulus for delivery although left system supplied by LIMA and SVG to OM  4) Optimum angiographic angle for delivery essentially AP (LAO 1 degree Cranial 0 degrees)  Jenkins Rouge   Electronically Signed  By: Jenkins Rouge M.D.  On: 06/08/2014 16:06      Study Result     EXAM: OVER-READ INTERPRETATION CT CHEST  The following report  is an over-read performed by radiologist Dr. Laurence Ferrari [facility] on 06/08/2014. This over-read does not include interpretation of cardiac or coronary anatomy or pathology. The coronary CTA interpretation by the cardiologist is attached.  COMPARISON: None.  FINDINGS: Secondary pulmonary lobular interstitial accentuation noted with faint ground-glass opacities scattered in both lungs. Bilateral airway thickening especially in the lower lobes.  Prior CABG. Small type 1 hiatal hernia.  IMPRESSION: 1. Scattered ground-glass opacities and secondary pulmonary lobular interstitial accentuation favor pulmonary edema. 2. Bilateral airway thickening, particularly in the lower lobes, possibly from edema or  inflammation. 3. Small type 1 hiatal hernia  Electronically Signed: By: Van Clines M.D. On: 06/08/2014 10:16     CT ANGIOGRAPHY CHEST WITH CONTRAST  TECHNIQUE: Multidetector CT imaging of the chest was performed using the standard protocol during bolus administration of intravenous contrast. Multiplanar CT image reconstructions and MIPs were obtained to evaluate the vascular anatomy.  CONTRAST: 55mL OMNIPAQUE IOHEXOL 350 MG/ML SOLN  COMPARISON: Coronary CTA performed 06/08/2014  FINDINGS: CT CHEST FINDINGS  Mediastinum/Nodes: No filling defects in the pulmonary arteries to suggest pulmonary emboli. Scattered small and borderline sized mediastinal lymph nodes. Left paratracheal/ AP window lymph node measures 11 mm subcarinal lymph node has a short axis diameter of 12 mm. Partially calcified right hilar lymph nodes have a short axis diameter of 9 mm.  Prior CABG. Heart is borderline in size. Aorta is normal caliber.  Lungs/Pleura: Trace bilateral pleural effusions. No confluent airspace opacities. Small subpleural nodule in the peripheral lower right upper lobe on image 47 measuring 5 mm. Scarring noted in the bases, left greater than right.  Musculoskeletal: Degenerative changes in the thoracic spine.  Upper abdomen: Imaging into the upper abdomen shows no acute findings.  Review of the MIP images confirms the above findings.  IMPRESSION: No evidence of pulmonary embolus.  Borderline sized mediastinal and right hilar lymph nodes, presumably reactive.  Trace bilateral pleural effusions. No confluent airspace opacity. Scarring in the lung bases.   Electronically Signed  By: Rolm Baptise M.D.  On: 06/11/2014 11:48   CT ANGIOGRAPHY CHEST, ABDOMEN AND PELVIS  TECHNIQUE: Multidetector CT imaging through the chest, abdomen and pelvis was performed using the standard protocol during bolus administration of intravenous contrast.  Multiplanar reconstructed images and MIPs were obtained and reviewed to evaluate the vascular anatomy.  CONTRAST: 43mL OMNIPAQUE IOHEXOL 350 MG/ML SOLN  COMPARISON: Chest CT 06/08/2014.  FINDINGS: CTA CHEST FINDINGS  Mediastinum/Lymph Nodes: Heart size is mildly enlarged. The apex of the left ventricle in is present immediately deep to the anterior left fifth/sixth intercostal space. There is no significant pericardial fluid, thickening or pericardial calcification. Severe thickening calcification of the aortic valve. There is atherosclerosis of the thoracic aorta, the great vessels of the mediastinum and the coronary arteries, including calcified atherosclerotic plaque in the left main, left anterior descending, left circumflex and right coronary arteries. Status post median sternotomy for CABG, including LIMA to the LAD. Multiple borderline enlarged mediastinal lymph nodes are nonspecific. Small hiatal hernia. No axillary lymphadenopathy.  Lungs/Pleura: A few scattered tiny pulmonary nodules are noted in the lungs bilaterally, largest of which measures only 5 mm in the left upper lobe (image 25 of series 403). No larger more suspicious appearing pulmonary nodules or masses are noted. No acute consolidative airspace disease. No pleural effusions.  Musculoskeletal/Soft Tissues: Median sternotomy wires. There are no aggressive appearing lytic or blastic lesions noted in the visualized portions of the skeleton.  CTA ABDOMEN AND PELVIS FINDINGS  Hepatobiliary: No cystic or solid hepatic lesions. No intra or extrahepatic biliary ductal dilatation. Tiny 2 mm calcified gallstone lying dependently in the neck of the gallbladder. Gallbladder is otherwise normal in appearance.  Pancreas: No pancreatic mass. No pancreatic ductal dilatation. No pancreatic or peripancreatic fluid or inflammatory changes.  Spleen: Unremarkable.  Adrenals/Urinary Tract: Bilateral adrenal  glands are normal in appearance. Mild cortical thinning in the kidneys bilaterally. The appearance of the kidneys is otherwise normal. No hydroureteronephrosis. Urinary bladder is normal in appearance.  Stomach/Bowel: The appearance of the stomach is normal. No pathologic dilatation of small bowel or colon. Several colonic diverticulae are noted, particularly in the sigmoid colon, without surrounding inflammatory changes to suggest an acute diverticulitis at this time. Small right inguinal hernia containing several loops of distal small bowel, without evidence of incarceration or obstruction at this time.  Vascular/Lymphatic: Vascular findings and measurements pertinent to potential TAVR procedure, as detailed below. In addition, there are ulcerated plaques in the infrarenal abdominal aorta (image 168 of series 401), and in the left common iliac artery (image 203 of series 401). There is also fusiform ectasia of the infrarenal abdominal aorta measuring up to 2.5 x 2.4 cm (image 179 of series 401). Stenosis at the ostium of the right renal artery appears potentially hemodynamically significant. Single renal arteries bilaterally. There also appears to be a potentially hemodynamically significant stenosis at the ostium of the inferior mesenteric artery. Celiac axis and SMA are widely patent. No lymphadenopathy noted in the abdomen or pelvis.  Reproductive: Prostate gland appears mildly enlarged measuring 5.6 x 4.1 cm. Seminal vesicles are unremarkable in appearance.  Other: No significant volume of ascites. No pneumoperitoneum.  Musculoskeletal: There are no aggressive appearing lytic or blastic lesions noted in the visualized portions of the skeleton.  VASCULAR MEASUREMENTS PERTINENT TO TAVR:  AORTA:  Minimal Aortic Diameter - 12 x 15 mm  Severity of Aortic Calcification - moderate to severe  RIGHT PELVIS:  Right Common Iliac Artery -  Minimal Diameter - 7.1  x 4.2 mm  Tortuosity - mild  Calcification - severe  Right External Iliac Artery -  Minimal Diameter - 5.2 x 4.6 mm  Tortuosity - mild  Calcification - moderate  Right Common Femoral Artery -  Minimal Diameter - 5.8 x 4.4 mm  Tortuosity - mild  Calcification - moderate  LEFT PELVIS:  Left Common Iliac Artery -  Minimal Diameter - 4.7 x 3.7 mm  Tortuosity - mild  Calcification - moderate  Left External Iliac Artery -  Minimal Diameter - 5.0 x 4.8 mm  Tortuosity - mild  Calcification - moderate  Left Common Femoral Artery -  Minimal Diameter - 4.7 x 4.2 mm  Tortuosity - mild  Calcification - moderate  Review of the MIP images confirms the above findings.  IMPRESSION: 1. Vascular findings and measurements pertinent to potential TAVR procedure, as detailed above. This patient does not appear to have suitable pelvic arterial access for TAVR. 2. The apex of the left ventricle is immediately deep to the anterior aspect of the left fifth/sixth intercostal space. 3. A few scattered tiny pulmonary nodules are nonspecific, largest of which measures only 5 mm in the left upper lobe. If the patient is at high risk for bronchogenic carcinoma, follow-up chest CT at 6-12 months is recommended. If the patient is at low risk for bronchogenic carcinoma, follow-up chest CT at 12 months is recommended. This recommendation follows the consensus statement: Guidelines for Management of Small Pulmonary Nodules Detected  on CT Scans: A Statement from the [employer] as published in Radiology 2005;237:395-400. 4. Colonic diverticulosis without findings to suggest acute diverticulitis at this time. 5. Cholelithiasis without evidence of acute cholecystitis at this time. 6. Additional incidental findings, as above.   Electronically Signed  By: Vinnie Langton M.D.  On: 06/16/2014 13:15   Pulmonary Function  Tests  Baseline      Post-bronchodilator  FVC  2.33 L  (79% predicted) FVC  2.35 L  (80% predicted) FEV1  1.85 L  (93% predicted) FEV1  1.97 L  (99% predicted) FEF25-75 1.79 L  (149% predicted) FEF25-75 2.35 L  (196% predicted)  TLC  4.58 L  (75% predicted) RV  2.18 L  (86% predicted) DLCO  38% predicted     Impression:  Patient has stage D severe symptomatic aortic stenosis with severe three-vessel coronary artery disease and vein graft disease status post coronary artery bypass grafting in the remote past. The patient also has at least moderate mitral regurgitation. He presents with progressive symptoms of exertional shortness of breath and fatigue consistent with chronic combined systolic and diastolic congestive heart failure, currently New York Heart Association functional class III-VI. He is also having chest pain c/w unstable angina pectoris.  I have personally reviewed the patient's recent transthoracic echocardiogram and diagnostic cardiac catheterization. The patient has classical findings of severe calcific aortic stenosis with severe thickening, calcification, and restricted leaflet mobility involving all 3 leaflets of the aortic valve. Peak velocity across the aortic valve measured in excess of 4.5 m/s corresponding to a mean transvalvular gradient of 44 mmHg. There is at least moderate mitral regurgitation, although pulmonary artery pressures were only mildly elevated and there were not large v-waves noted at the time of right heart catheterization. By transthoracic echocardiogram the mitral regurgitation is central and directed slightly posteriorly. It may be primarily related to type I dysfunction, although there may be some component of both type IIIA and type IIIB dysfunction as well. There is some thickening of both leaflets of the mitral valve and there may be significant annular calcification. Quantification of the severity of mitral regurgitation was notable for the  presence of an ERO measured 0.15 cm with a calculated regurgitant volume of 28 mL. An attempt at transesophageal echocardiogram to sort this out further was aborted because of difficulty with intubation of the esophagus from cricopharyngeus muscle spasm, and the patient has subsequently been found to have a Zenker's diverticulum. Coronary arteriography demonstrates the presence of severe three-vessel coronary artery disease with continued patency of both bypass grafts placed at the time of surgery in 1998 including a patent left internal mammary artery to the distal left anterior descending coronary artery and a patent vein graft to a large obtuse marginal branch. There is some progression of distal disease in all vascular territories, including 80% stenosis of the mid right coronary artery and diffuse high grade stenosis of the posterior descending coronary artery.  Risks associated with conventional surgery would be extremely high. I would not consider this elderly gentleman a candidate for redo coronary artery bypass grafting, aortic valve replacement, and mitral valve repair or replacement. Options include long-term palliative medical therapy versus a targeted less invasive approach including TAVR. The patient's coronary artery disease could potentially be treated using PCI, although at this point the patient's primary symptomatology relates to his valvular disease and congestive heart failure.   I have personally reviewed the patient's recent CT angiograms and pulmonary function tests. Cardiac gated CT angiogram of the  heart demonstrates findings consistent with severe aortic stenosis. The patient appears to have anatomical characteristics suitable for transcatheter aortic valve replacement. There is a fair amount of calcification in the aortic annulus that could slightly increase risk of paravalvular leak. However, there are no other significant complicating features. CT angiogram of the abdomen and  pelvis demonstrates the presence of borderline pelvic vascular access with a small infrarenal abdominal aortic aneurysm and diffuse atherotic atherosclerotic disease with high-grade stenosis of the left common iliac artery and moderate stenosis of the right common iliac artery. The patient appears to have adequate lumen size for possible transfemoral approach on the right side but not on the left.  However, there is diffuse disease with long segment calcification of the right common iliac artery, and a small localized dissection or ulcerated plaque.   Plan:   The patient and his daughter were again counseled at length regarding treatment alternatives for management of severe symptomatic aortic stenosis. Alternative approaches such as conventional aortic valve replacement, transcatheter aortic valve replacement, and palliative medical therapy were compared and contrasted at length.  The risks associated with conventional surgery were discussed in detail, as were the reasons why I feel this patient should not be considered a candidate for open surgery.  This discussion was placed in the context of the patient's own specific clinical presentation and past medical history.  All of their questions been addressed.  The patient hopes to proceed with transcatheter aortic valve replacement as soon as possible. We tentatively plan to proceed on 07/02/2014.  We will review the patient's CT angiograms with a multidisciplinary team as specialists and consider whether or not it seems reasonable to attempt a transfemoral approach on the right side. Alternatively, we will plan to use a transapical approach.  Given the patient's somewhat tenuous hemodynamic status with the presence of significant mitral regurgitation, the use of a trans-apical approach might be associated with increased risk of hemodynamic instability and/or the need for cardiopulmonary bypass support.   Following the decision to proceed with transcatheter  aortic valve replacement, a discussion has been held regarding what types of management strategies would be attempted intraoperatively in the event of life-threatening complications, including whether or not the patient would be considered a candidate for the use of cardiopulmonary bypass and/or conversion to open sternotomy for attempted surgical intervention.  Whereas we would potentially use cardioplegia bypass for short-term support with femoral cannulation, the patient would not be considered a candidate for emergency median sternotomy.  The patient has been advised of a variety of complications that might develop including but not limited to risks of death, stroke, paravalvular leak, aortic dissection or other major vascular complications, aortic annulus rupture, device embolization, cardiac rupture or perforation, mitral regurgitation, acute myocardial infarction, arrhythmia, heart block or bradycardia requiring permanent pacemaker placement, congestive heart failure, respiratory failure, renal failure, pneumonia, infection, other late complications related to structural valve deterioration or migration, or other complications that might ultimately cause a temporary or permanent loss of functional independence or other long term morbidity.  The patient provides full informed consent for the procedure as described and all questions were answered.  During the interim period of time the patient has been advised that he should call EMS or present to the ED if his breathing deteriorates any further or if he develops any prolonged episodes of chest pain unrelieved by rest.   I spent in excess of 30 minutes during the conduct of this office consultation and >50% of this time involved  direct face-to-face encounter with the patient for counseling and/or coordination of their care.    Valentina Gu. Roxy Manns, MD 06/21/2014 1:13 PM

## 2014-07-02 NOTE — Interval H&P Note (Signed)
History and Physical Interval Note:  07/02/2014 7:22 AM  Eric Buchanan  has presented today for surgery, with the diagnosis of SEVERE AS  The various methods of treatment have been discussed with the patient and family. After consideration of risks, benefits and other options for treatment, the patient has consented to  Procedure(s): TRANSCATHETER AORTIC VALVE REPLACEMENT, TRANSFEMORAL, POSSIBLE TRANSAPICAL (N/A) TRANSESOPHAGEAL ECHOCARDIOGRAM (TEE) (N/A) as a surgical intervention .  The patient's history has been reviewed, patient examined, no change in status, stable for surgery.  I have reviewed the patient's chart and labs.  Questions were answered to the patient's satisfaction.    Pt evaluated. No changes to report since Dr Guy Sandifer evaluation. Plans for TAVR via right TF approach, will prep TA in case unable to access from groin.  Case reviewed with OR team.  Sherren Mocha

## 2014-07-02 NOTE — Op Note (Signed)
[facility]  TAVR OPERATIVE NOTE   Date of Procedure:  07/02/2014  Preoperative Diagnosis: Severe Aortic Stenosis   Postoperative Diagnosis: Same   Procedure:    Transcatheter Aortic Valve Replacement - Transfemoral Approach  Edwards Sapien 3 THV (size 23 mm, model # 9600TFX, serial # [id number])   Co-Surgeons:  Valentina Gu. Roxy Manns, MD and Sherren Mocha, MD  Assistants:   Gaye Pollack, MD  Echocardiographer:  Dr Meda Coffee  Pre-operative Echo Findings:  Severe aortic stenosis  Normal left ventricular systolic function  Severe MR  Post-operative Echo Findings:  no paravalvular leak  normal left ventricular systolic function  Moderate (improved) MR  BRIEF CLINICAL NOTE AND INDICATIONS FOR SURGERY Patient is an 81 year old white male with known history of coronary artery disease status post coronary artery bypass grafting x 2 in 1998 at [facility], aortic stenosis, mitral regurgitation, hypertension, cerebrovascular disease with remote history of stroke in 1965, COPD, hyperlipidemia, and stage III chronic kidney disease who is being considered for TAVR for severe symptomatic aortic stenosis. The patient recently complained to his primary care physician about a one-year history of progressive symptoms of exertional shortness of breath and fatigue. He was noted to have a heart murmur on physical exam and underwent a transthoracic echocardiogram on 03/17/2014 that revealed severe aortic stenosis, moderate to severe mitral regurgitation, and a left ventricular ejection fraction estimated 60-65%. The patient was subsequently referred to Dr. Stanford Breed for cardiology evaluation and underwent left and right heart catheterization on 05/10/2014. This revealed severe left main disease and three-vessel coronary artery disease involving the native coronary arteries with patent left internal mammary artery graft to the left anterior descending coronary artery and patent but diseased  saphenous vein graft to the second obtuse marginal branch coronary artery. There was diffuse distal coronary artery disease. There was severe aortic stenosis with mean transvalvular gradient measured 25 mmHg at catheterization, corresponding to calculated aortic valve area of 0.8 cm. There was mild pulmonary hypertension with PA pressures measured 45/21 and mean pulmonary capillary wedge pressure of 21 mmHg without large v-waves. Mean central venous pressure was 8. After review of treatment options, we elected to treat him with with TAVR considering his extremely high risk of conventional surgery.  During the course of the patient's preoperative work up they have been evaluated comprehensively by a multidisciplinary team of specialists coordinated through the [facility] in the [facility].  They have been demonstrated to suffer from symptomatic severe aortic stenosis as noted above. The patient has been counseled extensively as to the relative risks and benefits of all options for the treatment of severe aortic stenosis including long term medical therapy, conventional surgery for aortic valve replacement, and transcatheter aortic valve replacement.  The patient has been independently evaluated by two cardiac surgeons including Dr Roxy Manns and Dr. Cyndia Bent, and they are felt to be at high risk for conventional surgical aortic valve replacement based upon a predicted risk of mortality using the Society of Thor[employer] Surgeons risk calculator of 17.7%. Both surgeons indicated the patient would be a poor candidate for conventional surgery (predicted risk of mortality >15% and/or predicted risk of permanent morbidity >50%).  Based upon review of all of the patient's preoperative diagnostic tests they are felt to be candidate for transcatheter aortic valve replacement using the transfemoral approach as an alternative to high risk conventional surgery.    Following the  decision to proceed with transcatheter aortic valve replacement, a discussion has been  held regarding what types of management strategies would be attempted intraoperatively in the event of life-threatening complications, including whether or not the patient would be considered a candidate for the use of cardiopulmonary bypass and/or conversion to open sternotomy for attempted surgical intervention.  The patient has been advised of a variety of complications that might develop peculiar to this approach including but not limited to risks of death, stroke, paravalvular leak, aortic dissection or other major vascular complications, aortic annulus rupture, device embolization, cardiac rupture or perforation, acute myocardial infarction, arrhythmia, heart block or bradycardia requiring permanent pacemaker placement, congestive heart failure, respiratory failure, renal failure, pneumonia, infection, other late complications related to structural valve deterioration or migration, or other complications that might ultimately cause a temporary or permanent loss of functional independence or other long term morbidity.  The patient provides full informed consent for the procedure as described and all questions were answered preoperatively.    DETAILS OF THE OPERATIVE PROCEDURE  PREPARATION:    The patient is brought to the operating room on the above mentioned date and central monitoring was established by the anesthesia team including placement of Swan-Ganz catheter and radial arterial line. The patient is placed in the supine position on the operating table.  Intravenous antibiotics are administered. General endotracheal anesthesia is induced uneventfully. A Foley catheter is placed.  Baseline transesophageal echocardiogram was performed. The patient's chest, abdomen, both groins, and both lower extremities are prepared and draped in a sterile manner. A time out procedure is performed.   PERIPHERAL ACCESS:     Using the modified Seldinger technique, femoral arterial and venous access was obtained with placement of 6 Fr sheaths on the left side.  A pigtail diagnostic catheter was passed through the left femoral arterial sheath under fluoroscopic guidance into the aortic root.  A temporary transvenous pacemaker catheter was passed through the left femoral venous sheath under fluoroscopic guidance into the right ventricle.  The pacemaker was tested to ensure stable lead placement and pacemaker capture. Aortic root angiography was performed in order to determine the optimal angiographic angle for valve deployment.   TRANSFEMORAL ACCESS:   A right femoral arterial cutdown was performed by Dr Roxy Manns. Please see his separate operative note for details. The patient was heparinized systemically and ACT verified > 250 seconds.    A 14 Fr transfemoral E-sheath was introduced into the right femoral artery after progressively dilating over an Amplatz superstiff wire. An AL-2 catheter was used to direct a straight-tip exchange length wire across the native aortic valve into the left ventricle. This was exchanged out for a pigtail catheter and position was confirmed in the LV apex. Simultaneous LV and Ao pressures were recorded.  The pigtail catheter was then exchanged for an Amplatz Extra-stiff wire in the LV apex. At that point, BAV was performed using a 20 mm valvuloplasty balloon.  Once optimal position was achieved, BAV was done under rapid ventricular pacing at 180 bpm. The patient recovered well hemodynamically.   TRANSCATHETER HEART VALVE DEPLOYMENT:  An Edwards Sapien 3 THV (size 23 mm) was prepared and crimped per manufacturer's guidelines, and the proper orientation of the valve is confirmed on the [employer] delivery system. The valve was advanced through the introducer sheath using normal technique until in an appropriate position in the abdominal aorta beyond the sheath tip. The balloon was then  retracted and using the fine-tuning wheel was centered on the valve. The valve was then advanced across the aortic arch using appropriate flexion of  the catheter. The valve was carefully positioned across the aortic valve annulus. The Commander catheter was retracted using normal technique. Once final position of the valve has been confirmed by angiographic assessment, the valve is deployed while temporarily holding ventilation and during rapid ventricular pacing to maintain systolic blood pressure < 50 mmHg and pulse pressure < 10 mmHg. The balloon inflation is held for >3 seconds after reaching full deployment volume. Once the balloon has fully deflated the balloon is retracted into the ascending aorta and valve function is assessed using transthoracic echo imaging. There is felt to be no paravalvular leak and no central aortic insufficiency.  The patient's hemodynamic recovery following valve deployment is good.  The deployment balloon and guidewire are both removed. Echo demostrated acceptable post-procedural gradients, improved mitral valve function, and no AI.   PROCEDURE COMPLETION:  The sheath was then removed and arteriotomy repaired by Dr Roxy Manns. Please see his separate report for details. Distal abdominal aortography was performed to evaluate for any arterial injury related to the procedure. There was no evidence dissection, perforation, or other vascular injury in the abdominal aorta or iliac artery. There was known flap in the distal aorta seen on preop CT and there was no apparent disruption of this area by DSA evaluation. There was significant narrowing at the arteriotomy site, but normal flow down the leg and a good doppler signal in the right PT.  Protamine was administered once femoral arterial repair was complete. The temporary pacemaker, pigtail catheters and femoral sheaths were removed with manual pressure used for hemostasis.   The patient tolerated the procedure well and is transported to  the surgical intensive care in stable condition. There were no immediate intraoperative complications. All sponge instrument and needle counts are verified correct at completion of the operation.   No blood products were administered during the operation.  The patient received a total of 70 mL of intravenous contrast during the procedure.  Sherren Mocha MD 07/02/2014 4:35 PM

## 2014-07-02 NOTE — Progress Notes (Signed)
Utilization Review Completed.Donne Anon T5/27/2016

## 2014-07-02 NOTE — Op Note (Addendum)
CARDIOTHORACIC SURGERY OPERATIVE NOTE  Date of Procedure:  07/02/2014  Preoperative Diagnosis: Severe Aortic Stenosis   Postoperative Diagnosis: Same   Procedure:    Transcatheter Aortic Valve Replacement - Open Right Transfemoral Approach  Edwards Sapien 3 Transcatheter Heart Valve (size 23 mm, model # 9600TFX, serial # [id number])   Co-Surgeons:  Valentina Gu. Roxy Manns, MD and Sherren Mocha, MD  Assistants:   Gaye Pollack, MD   Anesthesiologist:  Suzette Battiest, MD  Echocardiographer:  Ena Dawley, MD  Pre-operative Echo Findings:  Severe aortic stenosis  Mild left ventricular systolic dysfunction  Severe mitral regurgitation  Post-operative Echo Findings:  Trivial paravalvular leak  Unchanged left ventricular systolic function  Moderate mitral regurgitation     DETAILS OF THE OPERATIVE PROCEDURE  The majority of the procedure is documented separately in a procedure note by Dr. Burt Knack.   TRANSFEMORAL ACCESS:   A small incision is made in the right groin immediately over the common femoral artery. The subcutaneous tissues are divided with electrocautery and the anterior surface of the common femoral artery is identified.  The femoral artery is diffusely diseased with atherosclerosis. Dissection is continued proximally above the inguinal ligament to expose the distal portion of the right external iliac artery. The external iliac artery is also diffusely diseased and actually appears somewhat worse on the common femoral artery. There is a long segment posterior plaque that is hard and calcified throughout the majority of the external iliac and common femoral artery. The best site for cannulation is chosen on the right lateral aspect of the right common femoral artery well above the bifurcation.  A pair of CV-4 Gore-tex sutures are place as diamond-shaped purse-strings on the anterolateral surface of the right common femoral artery.  The patient is heparinized  systemically and ACT verified > 250 seconds.  The common femoral artery is punctured using an  18 gauge needle and a soft J-tipped guidewire is passed into the common iliac artery under fluoroscopic guidance.  A 6 Fr straight diagnostic catheter is placed over the guidewire and the guidewire is removed.  An Amplatz super stiff guidewire is passed through the sheath into the descending thoracic aorta and the introducing diagnostic catheter is removed.  Serial dilators are passed over the guidewire under continuous fluoroscopic guidance, making certain that each dilator passes easily all of the way into the distal abdominal aorta.  A 14 Fr Commander introducer sheath is passed over the guidewire into the abdominal aorta.  The introducing dilator is removed, the sheath is flushed with heparinized saline, and the sheath is secured to the skin.    FEMORAL SHEATH REMOVAL AND ARTERIAL CLOSURE:  After the completion of successful valve deployment as documented separately by Dr. Burt Knack, the femoral artery sheath is removed and the arteriotomy is closed using the previously placed Gore-tex purse-string sutures. Once the repair has been completed protamine was administered to reverse the anticoagulation. A digitally-subtracted arteriogram is obtained from just above the aortic bifurcation to below the arteriotomy to confirm the integrity of the vascular repair.  There is significant stenosis at the site of vascular repair but good flow is noted through the area of stenosis. The patient has easily appreciable Doppler signal in the right posterior tibial artery at the ankle. Options are discussed by the entire team and decision is made not to attempt patch angioplasty of the femoral artery because of the severe diffuse nature of disease with concerned that any attempt to temporarily occlude the vessel might cause additional trouble  and concerned. Plan is made to follow the patient carefully for symptoms and consider  endovascular treatment of the right common femoral artery stenosis if it proves to be problematic.  The incision is irrigated with saline solution and subsequently closed in multiple layers using absorbable suture.  The skin incision is closed using a subcuticular skin closure.     Valentina Gu. Roxy Manns MD 07/02/2014 11:14 AM

## 2014-07-03 ENCOUNTER — Inpatient Hospital Stay (HOSPITAL_COMMUNITY): Payer: Medicare Other

## 2014-07-03 ENCOUNTER — Encounter (HOSPITAL_COMMUNITY): Payer: Self-pay | Admitting: Cardiovascular Disease

## 2014-07-03 DIAGNOSIS — I350 Nonrheumatic aortic (valve) stenosis: Secondary | ICD-10-CM

## 2014-07-03 DIAGNOSIS — I5023 Acute on chronic systolic (congestive) heart failure: Secondary | ICD-10-CM

## 2014-07-03 DIAGNOSIS — Z954 Presence of other heart-valve replacement: Secondary | ICD-10-CM

## 2014-07-03 LAB — BASIC METABOLIC PANEL
Anion gap: 6 (ref 5–15)
BUN: 19 mg/dL (ref 6–20)
CALCIUM: 8.3 mg/dL — AB (ref 8.9–10.3)
CO2: 23 mmol/L (ref 22–32)
Chloride: 99 mmol/L — ABNORMAL LOW (ref 101–111)
Creatinine, Ser: 1.18 mg/dL (ref 0.61–1.24)
GFR calc non Af Amer: 54 mL/min — ABNORMAL LOW (ref >60)
Glucose, Bld: 105 mg/dL — ABNORMAL HIGH (ref 65–99)
POTASSIUM: 4.1 mmol/L (ref 3.5–5.1)
SODIUM: 128 mmol/L — AB (ref 135–145)

## 2014-07-03 LAB — GLUCOSE, CAPILLARY
GLUCOSE-CAPILLARY: 109 mg/dL — AB (ref 65–99)
GLUCOSE-CAPILLARY: 129 mg/dL — AB (ref 65–99)
GLUCOSE-CAPILLARY: 97 mg/dL (ref 65–99)
Glucose-Capillary: 111 mg/dL — ABNORMAL HIGH (ref 65–99)
Glucose-Capillary: 131 mg/dL — ABNORMAL HIGH (ref 65–99)

## 2014-07-03 LAB — CBC
HCT: 28 % — ABNORMAL LOW (ref 39.0–52.0)
Hemoglobin: 9.8 g/dL — ABNORMAL LOW (ref 13.0–17.0)
MCH: 32.5 pg (ref 26.0–34.0)
MCHC: 35 g/dL (ref 30.0–36.0)
MCV: 92.7 fL (ref 78.0–100.0)
Platelets: 162 K/uL (ref 150–400)
RBC: 3.02 MIL/uL — ABNORMAL LOW (ref 4.22–5.81)
RDW: 15.6 % — ABNORMAL HIGH (ref 11.5–15.5)
WBC: 6 K/uL (ref 4.0–10.5)

## 2014-07-03 LAB — MAGNESIUM: MAGNESIUM: 2.2 mg/dL (ref 1.7–2.4)

## 2014-07-03 MED ORDER — METOPROLOL TARTRATE 12.5 MG HALF TABLET
12.5000 mg | ORAL_TABLET | Freq: Two times a day (BID) | ORAL | Status: DC
  Administered 2014-07-03 – 2014-07-04 (×2): 12.5 mg via ORAL
  Filled 2014-07-03 (×4): qty 1

## 2014-07-03 MED ORDER — NITROGLYCERIN IN D5W 200-5 MCG/ML-% IV SOLN
0.0000 ug/min | INTRAVENOUS | Status: DC
  Administered 2014-07-03: 10 ug/min via INTRAVENOUS

## 2014-07-03 MED ORDER — ISOSORBIDE MONONITRATE ER 30 MG PO TB24
30.0000 mg | ORAL_TABLET | Freq: Every day | ORAL | Status: DC
  Administered 2014-07-03 – 2014-07-06 (×4): 30 mg via ORAL
  Filled 2014-07-03 (×4): qty 1

## 2014-07-03 MED ORDER — FUROSEMIDE 10 MG/ML IJ SOLN
40.0000 mg | Freq: Once | INTRAMUSCULAR | Status: AC
  Administered 2014-07-03: 40 mg via INTRAVENOUS
  Filled 2014-07-03: qty 4

## 2014-07-03 NOTE — Progress Notes (Addendum)
    Subjective:  Feels ok. No CP or shortness of breath at rest. Sitting up in chair.  Objective:  Vital Signs in the last 24 hours: Temp:  [96.3 F (35.7 C)-98.5 F (36.9 C)] 97.5 F (36.4 C) (05/28 0900) Pulse Rate:  [32-111] 93 (05/28 0900) Resp:  [7-39] 17 (05/28 0900) BP: (104-167)/(42-95) 162/71 mmHg (05/28 0900) SpO2:  [93 %-100 %] 100 % (05/28 0900) Arterial Line BP: (159-189)/(52-107) 168/76 mmHg (05/27 1500) Weight:  [144 lb 2.9 oz (65.4 kg)] 144 lb 2.9 oz (65.4 kg) (05/28 0630)  Intake/Output from previous day: 05/27 0701 - 05/28 0700 In: 3158.3 [P.O.:240; I.V.:2268.3; IV Piggyback:650] Out: 2775 [Urine:2775]  Physical Exam: Pt is alert and oriented, pleasant elderly male in NAD HEENT: normal Neck: JVP - normal Lungs: good air movement, few rales in bases CV: RRR without murmur or gallop Abd: soft, NT, Positive BS, no hepatomegaly Ext: 1+ pretibial edema, distal pulses intact and equal Skin: warm/dry no rash   Lab Results:  Recent Labs  07/02/14 1824 07/03/14 0414  WBC 6.4 6.0  HGB 9.9* 9.8*  PLT 186 162    Recent Labs  06/30/14 0925  07/02/14 1819 07/02/14 1824 07/03/14 0414  NA 128*  < > 128*  --  128*  K 4.5  < > 4.2  --  4.1  CL 99*  < > 100*  --  99*  CO2 18*  --   --   --  23  GLUCOSE 99  < > 102*  --  105*  BUN 21*  < > 24*  --  19  CREATININE 1.34*  < > 1.20 1.15 1.18  < > = values in this interval not displayed. No results for input(s): TROPONINI in the last 72 hours.  Invalid input(s): CK, MB  Cardiac Studies: CXR: IMPRESSION: Although the lungs are less well aerated overall, there is minimal improvement in apical aeration which could indicate improving pulmonary edema with residual mild alveolar and interstitial pulmonary edema.  Increased retrocardiac presumed atelectasis.  2D Echo: pending  Tele: Sinus rhythm with PAC's  Assessment/Plan:  1. Acute on chronic diastolic heart failure, improving post-TAVR but still  with interstitial edema on CXR 2. Severe AS s/p TAVR POD#1 3. Severe MR, seemingly improved post-TAVR, await post-op echo 4. Hyponatremia, chronic 5. Anemia, mild, stable 6. Essential hypertension - BP elevated currently on NTG drip 6. CAD - no angina at present  Overall doing remarkably well considering preop issues/CHF. Will diurese with IV lasix this am, start on metoprolol and low-dose imdur. Wean off milrinone and IV NTG. Keep foley in today with diuresiis, plan dc foley and further mobilize tomorrow. Anticipate transfer to floor tomorrow. ASA/plavix post-TAVR. 2D Echo today.  Spoke with pt about beta-blocker allergy - he denies. Has dx of COPD but PFT's reviewed and no evidence of significant reactive airway disease. I think benefit outweighs risk and will try low-dose metoprolol.  Sherren Mocha, M.D. 07/03/2014, 9:14 AM

## 2014-07-03 NOTE — Progress Notes (Signed)
1 Day Post-Op Procedure(s) (LRB): TRANSCATHETER AORTIC VALVE REPLACEMENT, TRANSFEMORAL (N/A) TRANSESOPHAGEAL ECHOCARDIOGRAM (TEE) (N/A) Subjective: Up in chair Complains of right groin pain Right leg warm with excellent perfusion Vital signs stable  Objective: Vital signs in last 24 hours: Temp:  [97.5 F (36.4 C)-98.5 F (36.9 C)] 97.5 F (36.4 C) (05/28 0900) Pulse Rate:  [51-111] 78 (05/28 1400) Cardiac Rhythm:  [-] Heart block (05/28 1200) Resp:  [14-39] 14 (05/28 1400) BP: (104-167)/(42-95) 124/86 mmHg (05/28 1400) SpO2:  [93 %-100 %] 98 % (05/28 1400) Arterial Line BP: (168)/(76) 168/76 mmHg (05/27 1500) Weight:  [144 lb 2.9 oz (65.4 kg)] 144 lb 2.9 oz (65.4 kg) (05/28 0630)  Hemodynamic parameters for last 24 hours: PAP: (46)/(20) 46/20 mmHg CO:  [4.5 L/min] 4.5 L/min CI:  [2.7 L/min/m2] 2.7 L/min/m2  Intake/Output from previous day: 05/27 0701 - 05/28 0700 In: 3158.3 [P.O.:240; I.V.:2268.3; IV Piggyback:650] Out: 2775 [Urine:2775] Intake/Output this shift: Total I/O In: 79.6 [I.V.:29.6; IV Piggyback:50] Out: 610 [Urine:610]    Lab Results:  Recent Labs  07/02/14 1824 07/03/14 0414  WBC 6.4 6.0  HGB 9.9* 9.8*  HCT 28.4* 28.0*  PLT 186 162   BMET:  Recent Labs  07/02/14 1819 07/02/14 1824 07/03/14 0414  NA 128*  --  128*  K 4.2  --  4.1  CL 100*  --  99*  CO2  --   --  23  GLUCOSE 102*  --  105*  BUN 24*  --  19  CREATININE 1.20 1.15 1.18  CALCIUM  --   --  8.3*    PT/INR:  Recent Labs  07/02/14 1158  LABPROT 17.0*  INR 1.37   ABG    Component Value Date/Time   PHART 7.384 07/02/2014 1154   HCO3 19.3* 07/02/2014 1154   TCO2 18 07/02/2014 1819   ACIDBASEDEF 5.0* 07/02/2014 1154   O2SAT 91.0 07/02/2014 1154   CBG (last 3)   Recent Labs  07/02/14 2354 07/03/14 0900 07/03/14 1232  GLUCAP 97 109* 111*    Assessment/Plan: S/P  Mobilize d/c tubes/lines   LOS: 1 day    Eric Buchanan 07/03/2014

## 2014-07-03 NOTE — Progress Notes (Signed)
*  PRELIMINARY RESULTS* Echocardiogram 2D Echocardiogram has been performed.  Leavy Cella 07/03/2014, 11:30 AM

## 2014-07-04 LAB — BASIC METABOLIC PANEL
Anion gap: 9 (ref 5–15)
BUN: 24 mg/dL — ABNORMAL HIGH (ref 6–20)
CALCIUM: 8.5 mg/dL — AB (ref 8.9–10.3)
CO2: 23 mmol/L (ref 22–32)
CREATININE: 1.36 mg/dL — AB (ref 0.61–1.24)
Chloride: 96 mmol/L — ABNORMAL LOW (ref 101–111)
GFR calc Af Amer: 53 mL/min — ABNORMAL LOW (ref >60)
GFR calc non Af Amer: 45 mL/min — ABNORMAL LOW (ref >60)
GLUCOSE: 101 mg/dL — AB (ref 65–99)
Potassium: 3.9 mmol/L (ref 3.5–5.1)
Sodium: 128 mmol/L — ABNORMAL LOW (ref 135–145)

## 2014-07-04 LAB — GLUCOSE, CAPILLARY
GLUCOSE-CAPILLARY: 108 mg/dL — AB (ref 65–99)
GLUCOSE-CAPILLARY: 109 mg/dL — AB (ref 65–99)
Glucose-Capillary: 116 mg/dL — ABNORMAL HIGH (ref 65–99)
Glucose-Capillary: 103 mg/dL — ABNORMAL HIGH (ref 65–99)
Glucose-Capillary: 133 mg/dL — ABNORMAL HIGH (ref 65–99)

## 2014-07-04 MED ORDER — METOPROLOL TARTRATE 25 MG PO TABS
25.0000 mg | ORAL_TABLET | Freq: Two times a day (BID) | ORAL | Status: DC
  Administered 2014-07-04 – 2014-07-06 (×4): 25 mg via ORAL
  Filled 2014-07-04 (×5): qty 1

## 2014-07-04 MED ORDER — FUROSEMIDE 40 MG PO TABS
40.0000 mg | ORAL_TABLET | Freq: Every day | ORAL | Status: DC
  Filled 2014-07-04 (×2): qty 1

## 2014-07-04 MED ORDER — ASPIRIN EC 81 MG PO TBEC
81.0000 mg | DELAYED_RELEASE_TABLET | Freq: Every day | ORAL | Status: DC
  Administered 2014-07-04 – 2014-07-05 (×2): 81 mg via ORAL
  Filled 2014-07-04 (×4): qty 1

## 2014-07-04 NOTE — Progress Notes (Signed)
Removed Right IJ. Pt educated and verbalized understanding to lie flat and notify nurse of any problems. Pt tolerated well and catheter intact. Vaseline Guaze and dressing applied.  Will continue to monitor.   Earlie Lou

## 2014-07-04 NOTE — Progress Notes (Addendum)
    Subjective:  Feels well. Groin discomfort improved. No other complaints. Walked farther yesterday than he's been able to go in a long time.  Objective:  Vital Signs in the last 24 hours: Temp:  [97.6 F (36.4 C)-98.7 F (37.1 C)] 98 F (36.7 C) (05/29 0000) Pulse Rate:  [58-98] 74 (05/29 0800) Resp:  [14-30] 16 (05/29 0800) BP: (103-184)/(49-95) 166/94 mmHg (05/29 0800) SpO2:  [94 %-100 %] 98 % (05/29 0800) Weight:  [143 lb 4.8 oz (65 kg)] 143 lb 4.8 oz (65 kg) (05/29 0600)  Intake/Output from previous day: 05/28 0701 - 05/29 0700 In: 1092.6 [P.O.:960; I.V.:32.6; IV Piggyback:100] Out: 1960 [Urine:1960]  Physical Exam: Pt is alert and oriented, pleasant elderly male in NAD HEENT: normal Neck: JVP - normal Lungs: CTA bilaterally CV: RRR without murmur or gallop Abd: soft, NT, Positive BS, no hepatomegaly Ext: trace pedal edema. Right PT 1+ Skin: warm/dry no rash   Lab Results:  Recent Labs  07/02/14 1824 07/03/14 0414  WBC 6.4 6.0  HGB 9.9* 9.8*  PLT 186 162    Recent Labs  07/03/14 0414 07/04/14 0400  NA 128* 128*  K 4.1 3.9  CL 99* 96*  CO2 23 23  GLUCOSE 105* 101*  BUN 19 24*  CREATININE 1.18 1.36*   No results for input(s): TROPONINI in the last 72 hours.  Invalid input(s): CK, MB  Cardiac Studies: 2D Echo (post-op study) Study Conclusions  - Left ventricle: The cavity size was normal. Systolic function was normal. The estimated ejection fraction was in the range of 50% to 55%. Wall motion was normal; there were no regional wall motion abnormalities. The study is not technically sufficient to allow evaluation of LV diastolic function. - Aortic valve: 23 mm Edward-SAPIEN valve sits well in the aortic position. There is no central or paravalvular leak. Transaortic gradients are normal. - Mitral valve: There was moderate regurgitation directed centrally. - Left atrium: The atrium was mildly dilated. - Right ventricle:  Systolic function was mildly reduced. - Right atrium: The atrium was normal in size. - Pulmonary arteries: Systolic pressure was mildly to moderately increased. PA peak pressure: 46 mm Hg (S). - Inferior vena cava: The vessel was normal in size. - Pericardium, extracardiac: There was no pericardial effusion.  Tele: Personally reviewed. Sinus rhythm with frequent PAC's  Assessment/Plan:  1. Acute on chronic diastolic heart failure, improved with TAVR, diuresis 2. Severe AS s/p TAVR POD#2 3. Severe MR, now improved and assessed as moderate by post-op echo 4. Hyponatremia, chronic - stable at 128 today 5. Anemia, mild, stable 6. Essential hypertension - now off of NTG gtt, on oral metoprolol and imdur. Increase metoprolol to 25 mg BID today 6. CAD - no angina at present  Pt making excellent progress now POD#2 from TAVR. Will transfer to 2W today. PT/Case Mgr for ST-SNF. Pt very functional and would like to return home but doesn't have someone there 24 hours. Think best for ST-SNF prior to return home. He is in agreement.  Creatinine mildly elevated today. Will recheck in am and consider resume lisinopril without HCTZ if stable.   Sherren Mocha, M.D. 07/04/2014, 9:04 AM

## 2014-07-05 DIAGNOSIS — I350 Nonrheumatic aortic (valve) stenosis: Secondary | ICD-10-CM

## 2014-07-05 DIAGNOSIS — E871 Hypo-osmolality and hyponatremia: Secondary | ICD-10-CM

## 2014-07-05 LAB — BASIC METABOLIC PANEL
Anion gap: 8 (ref 5–15)
BUN: 25 mg/dL — ABNORMAL HIGH (ref 6–20)
CO2: 23 mmol/L (ref 22–32)
Calcium: 8.3 mg/dL — ABNORMAL LOW (ref 8.9–10.3)
Chloride: 95 mmol/L — ABNORMAL LOW (ref 101–111)
Creatinine, Ser: 1.43 mg/dL — ABNORMAL HIGH (ref 0.61–1.24)
GFR calc Af Amer: 50 mL/min — ABNORMAL LOW (ref >60)
GFR calc non Af Amer: 43 mL/min — ABNORMAL LOW (ref >60)
GLUCOSE: 102 mg/dL — AB (ref 65–99)
Potassium: 4.2 mmol/L (ref 3.5–5.1)
Sodium: 126 mmol/L — ABNORMAL LOW (ref 135–145)

## 2014-07-05 NOTE — Progress Notes (Signed)
Pt heart rate and rhythm variable and difficult to discern via tele after asymptomatic 5 beat run.  EKG performed.  Pt without complaints, eating lunch and chatting with visiting daughter.  MD paged without return call.  No intervention needed so not paged again.  Will don't plan of care.

## 2014-07-05 NOTE — Clinical Social Work Placement (Signed)
   CLINICAL SOCIAL WORK PLACEMENT  NOTE  Date:  07/05/2014  Patient Details  Name: Eric Buchanan MRN: [id number] Date of Birth: 23-Jun-1925  Clinical Social Work is seeking post-discharge placement for this patient at the [place] level of care (*CSW will initial, date and re-position this form in  chart as items are completed):  Yes   Patient/family provided with [facility] Work Department's list of facilities offering this level of care within the geographic area requested by the patient (or if unable, by the patient's family).  Yes   Patient/family informed of their freedom to choose among providers that offer the needed level of care, that participate in Medicare, Medicaid or managed care program needed by the patient, have an available bed and are willing to accept the patient.  Yes   Patient/family informed of [facility]'s ownership interest in [facility] and [facility], as well as of the fact that they are under no obligation to receive care at these facilities.  PASRR submitted to EDS on 07/05/14     PASRR number received on 07/05/14     Existing PASRR number confirmed on       FL2 transmitted to all facilities in geographic area requested by pt/family on 07/05/14     FL2 transmitted to all facilities within larger geographic area on       Patient informed that his/her managed care company has contracts with or will negotiate with certain facilities, including the following:            Patient/family informed of bed offers received.  Patient chooses bed at       Physician recommends and patient chooses bed at      Patient to be transferred to   on  .  Patient to be transferred to facility by       Patient family notified on   of transfer.  Name of family member notified:        PHYSICIAN Please sign FL2     Additional Comment:    _______________________________________________ Cranford Mon, LCSW 07/05/2014, 10:40  AM

## 2014-07-05 NOTE — Evaluation (Signed)
Physical Therapy Evaluation Patient Details Name: Eric Buchanan MRN: [id number] DOB: [birth year] Today's Date: 07/05/2014   History of Present Illness  Pt is an 81 y/o male with a PMH of severe aortic stenosis. Pt is now s/p TAVR procedure and TEE on 07/02/14. `  Clinical Impression  Pt admitted with above diagnosis. Pt currently with functional limitations due to the deficits listed below (see PT Problem List). At the time of PT eval pt was able to perform transfers and ambulation with hands-on guarding for balance and safety. Pt required increased cueing for walker placement and hand positioning on seated surface for safety. Pt will benefit from skilled PT to increase their independence and safety with mobility to allow discharge to the venue listed below. As pt lives alone, recommending further skilled therapy services at the SNF level prior to return home.     Follow Up Recommendations SNF;Supervision/Assistance - 24 hour    Equipment Recommendations  Rolling walker with 5" wheels    Recommendations for Other Services       Precautions / Restrictions Precautions Precautions: Fall Restrictions Weight Bearing Restrictions: No      Mobility  Bed Mobility               General bed mobility comments: Pt was sitting up in recliner chair upon PT arrival.   Transfers Overall transfer level: Needs assistance Equipment used: Rolling walker (2 wheeled) Transfers: Sit to/from Stand Sit to Stand: Min guard         General transfer comment: Close guard for safety and placement of walker right in front of pt when initiating transfers. Pt was able to power-up to full standing position without physical assist.   Ambulation/Gait Ambulation/Gait assistance: Min guard Ambulation Distance (Feet): 200 Feet Assistive device: Rolling walker (2 wheeled) Gait Pattern/deviations: Step-through pattern;Decreased stride length;Trunk flexed Gait velocity: Decreased Gait velocity  interpretation: Below normal speed for age/gender General Gait Details: Pt was able to ambulate fairly well in the hall with RW for balance and energy conservation. Pt reports SOB at end of gait training, however recovered well with seated rest break at end of gait training.   Stairs            Wheelchair Mobility    Modified Rankin (Stroke Patients Only)       Balance Overall balance assessment: Needs assistance Sitting-balance support: Feet supported;No upper extremity supported Sitting balance-Leahy Scale: Good     Standing balance support: Bilateral upper extremity supported Standing balance-Leahy Scale: Poor Standing balance comment: Pt requires UE support at this time to maintain standing balance.                              Pertinent Vitals/Pain Pain Assessment: No/denies pain    Home Living Family/patient expects to be discharged to:: Private residence Living Arrangements: Alone Available Help at Discharge: Family;Friend(s);Available PRN/intermittently Type of Home: House Home Access: Stairs to enter Entrance Stairs-Rails: Right;Left;Can reach both Entrance Stairs-Number of Steps: ~3 Home Layout: One level Home Equipment: None      Prior Function Level of Independence: Independent         Comments: Pt reports that he was furniture walking at home and was able to walk into the store to use a motorized cart to do his grocery shopping. Was managing ADL's without assist and still getting into the shower to wash.      Hand Dominance   Dominant Hand: Right  Extremity/Trunk Assessment   Upper Extremity Assessment: Defer to OT evaluation           Lower Extremity Assessment: Generalized weakness      Cervical / Trunk Assessment: Kyphotic;Other exceptions  Communication   Communication: HOH  Cognition Arousal/Alertness: Awake/alert Behavior During Therapy: WFL for tasks assessed/performed Overall Cognitive Status: Within  Functional Limits for tasks assessed                      General Comments      Exercises        Assessment/Plan    PT Assessment Patient needs continued PT services  PT Diagnosis Difficulty walking;Generalized weakness   PT Problem List Decreased strength;Decreased range of motion;Decreased activity tolerance;Decreased balance;Decreased mobility;Decreased knowledge of use of DME;Decreased safety awareness;Decreased knowledge of precautions;Cardiopulmonary status limiting activity  PT Treatment Interventions DME instruction;Gait training;Stair training;Functional mobility training;Therapeutic activities;Therapeutic exercise;Neuromuscular re-education;Patient/family education   PT Goals (Current goals can be found in the Care Plan section) Acute Rehab PT Goals Patient Stated Goal: Exceed PLOF PT Goal Formulation: With patient Time For Goal Achievement: 07/19/14 Potential to Achieve Goals: Good    Frequency Min 2X/week   Barriers to discharge Decreased caregiver support Lives alone    Co-evaluation               End of Session Equipment Utilized During Treatment: Gait belt Activity Tolerance: Patient tolerated treatment well Patient left: in chair;with call bell/phone within reach Nurse Communication: Mobility status         Time: 5436-0677 PT Time Calculation (min) (ACUTE ONLY): 25 min   Charges:   PT Evaluation $Initial PT Evaluation Tier I: 1 Procedure PT Treatments $Gait Training: 8-22 mins   PT G Codes:        Rolinda Roan 07-22-14, 10:14 AM  Rolinda Roan, PT, DPT Acute Rehabilitation Services Pager: [phone]

## 2014-07-05 NOTE — Progress Notes (Signed)
      [address]       [place] [address]             [phone]      3 Days Post-Op Procedure(s) (LRB): TRANSCATHETER AORTIC VALVE REPLACEMENT, TRANSFEMORAL (N/A) TRANSESOPHAGEAL ECHOCARDIOGRAM (TEE) (N/A) Subjective: Some DOE, but breathing is comfortable @rest . Some right upper thigh discomfort(mild) not incisional   Objective: Vital signs in last 24 hours: Temp:  [96.2 F (35.7 C)-98 F (36.7 C)] 98 F (36.7 C) (05/30 0448) Pulse Rate:  [57-91] 71 (05/30 0448) Cardiac Rhythm:  [-] Heart block (05/29 1959) Resp:  [16-34] 20 (05/30 0448) BP: (99-165)/(51-112) 120/82 mmHg (05/30 0448) SpO2:  [95 %-100 %] 98 % (05/30 0448) Weight:  [145 lb 1 oz (65.8 kg)] 145 lb 1 oz (65.8 kg) (05/30 0448)  Hemodynamic parameters for last 24 hours:    Intake/Output from previous day: 05/29 0701 - 05/30 0700 In: 1250 [P.O.:1200; IV Piggyback:50] Out: 700 [Urine:700] Intake/Output this shift:    General appearance: alert, cooperative and no distress Heart: regular rate and rhythm and occas extrasystole Lungs: clear to auscultation bilaterally Abdomen: benign Extremities: no edema Wound: incis healing well Heart cont-no murmur or rub Lab Results:  Recent Labs  07/02/14 1824 07/03/14 0414  WBC 6.4 6.0  HGB 9.9* 9.8*  HCT 28.4* 28.0*  PLT 186 162   BMET:  Recent Labs  07/04/14 0400 07/05/14 0347  NA 128* 126*  K 3.9 4.2  CL 96* 95*  CO2 23 23  GLUCOSE 101* 102*  BUN 24* 25*  CREATININE 1.36* 1.43*  CALCIUM 8.5* 8.3*    PT/INR:  Recent Labs  07/02/14 1158  LABPROT 17.0*  INR 1.37   ABG    Component Value Date/Time   PHART 7.384 07/02/2014 1154   HCO3 19.3* 07/02/2014 1154   TCO2 18 07/02/2014 1819   ACIDBASEDEF 5.0* 07/02/2014 1154   O2SAT 91.0 07/02/2014 1154   CBG (last 3)   Recent Labs  07/04/14 0917 07/04/14 1255 07/04/14 1608  GLUCAP 116* 103* 133*    Meds Scheduled Meds: . acetaminophen  1,000 mg Oral 4 times per day    Or  . acetaminophen (TYLENOL) oral liquid 160 mg/5 mL  1,000 mg Per Tube 4 times per day  . acetaminophen  650 mg Rectal Once  . allopurinol  100 mg Oral Daily  . aspirin EC  81 mg Oral QHS  . atorvastatin  40 mg Oral q1800  . budesonide-formoterol  2 puff Inhalation BID  . Chlorhexidine Gluconate Cloth  6 each Topical Q0600  . clopidogrel  75 mg Oral Q breakfast  . fluticasone  1 spray Each Nare Daily  . furosemide  40 mg Oral Daily  . isosorbide mononitrate  30 mg Oral Daily  . metoprolol tartrate  25 mg Oral BID  . mupirocin ointment  1 application Nasal BID  . MUSCLE RUB   Topical TID  . pantoprazole  40 mg Oral Daily  . tiotropium  18 mcg Inhalation Daily   Continuous Infusions:  PRN Meds:.sodium chloride, albuterol, ondansetron (ZOFRAN) IV, sodium chloride, traMADol  Xrays No results found.  Assessment/Plan: S/P Procedure(s) (LRB): TRANSCATHETER AORTIC VALVE REPLACEMENT, TRANSFEMORAL (N/A) TRANSESOPHAGEAL ECHOCARDIOGRAM (TEE) (N/A)  1 clinically doing quite well 2 hyponatremia/creat is slightly worse and lasix now on hold- cardiology managing medical issues 3 SNF when bed avail and medically ready    LOS: 3 days    GOLD,WAYNE E 07/05/2014

## 2014-07-05 NOTE — Progress Notes (Signed)
    Subjective:  Doing well this morning. Still with some right groin soreness - taking Tylenol. No other complaints today.  Objective:  Vital Signs in the last 24 hours: Temp:  [96.2 F (35.7 C)-98 F (36.7 C)] 98 F (36.7 C) (05/30 0448) Pulse Rate:  [57-92] 71 (05/30 0448) Resp:  [16-34] 20 (05/30 0448) BP: (99-166)/(51-112) 120/82 mmHg (05/30 0448) SpO2:  [95 %-100 %] 98 % (05/30 0448) Weight:  [145 lb 1 oz (65.8 kg)] 145 lb 1 oz (65.8 kg) (05/30 0448)  Intake/Output from previous day: 05/29 0701 - 05/30 0700 In: 1250 [P.O.:1200; IV Piggyback:50] Out: 700 [Urine:700]  Physical Exam: Pt is alert and oriented, elderly male in NAD HEENT: normal Neck: JVP - normal Lungs: CTA bilaterally CV: RRR without murmur or gallop Abd: soft, NT, Positive BS, no hepatomegaly Ext: no C/C/E, distal pulses intact and equal, right groin incision healing well. Left groin dressing removed - site clear. Skin: warm/dry no rash   Lab Results:  Recent Labs  07/02/14 1824 07/03/14 0414  WBC 6.4 6.0  HGB 9.9* 9.8*  PLT 186 162    Recent Labs  07/04/14 0400 07/05/14 0347  NA 128* 126*  K 3.9 4.2  CL 96* 95*  CO2 23 23  GLUCOSE 101* 102*  BUN 24* 25*  CREATININE 1.36* 1.43*   No results for input(s): TROPONINI in the last 72 hours.  Invalid input(s): CK, MB  Tele: Personally reviewed: sinus rhythm with PAC's, rare PVC's  Assessment/Plan:  1. Acute on chronic diastolic heart failure, improved with TAVR, diuresis 2. Severe AS s/p TAVR POD#3 3. Severe MR, now improved and assessed as moderate by post-op echo 4. Hyponatremia, chronic - Na trending down to 126 today. Hold lasix. 5. Anemia, mild, stable 6. Essential hypertension - BP controlled on current Rx. 7. CAD - no angina at present 8. CKD Stage 3 - creatinine up slightly to 1.43. Will hold lasix today and repeat tomorrow  Medically stable. Will hold lasix today and repeat BMET in am. PT assessment pending for ST-SNF.  Social work consult placed. Anticipate dc pending bed availability.  Sherren Mocha, M.D. 07/05/2014, 6:28 AM

## 2014-07-05 NOTE — Clinical Social Work Note (Signed)
Clinical Social Work Assessment  Patient Details  Name: Eric Buchanan MRN: [id number] Date of Birth: 03/22/1925  Date of referral:  07/05/14               Reason for consult:  Facility Placement                Permission sought to share information with:  Chartered certified accountant granted to share information::  Yes, Verbal Permission Granted  Name::        Agency::  [facility]  Relationship::     Contact Information:     Housing/Transportation Living arrangements for the past 2 months:  Single Family Home Source of Information:  Patient Patient Interpreter Needed:  None Criminal Activity/Legal Involvement Pertinent to Current Situation/Hospitalization:  No - Comment as needed Significant Relationships:  Adult Children Lives with:  Self (pt dtr lives on same property but different structure) Do you feel safe going back to the place where you live?  Yes Need for family participation in patient care:  Yes (Comment)  Care giving concerns: pt live alone though his daughter lives beside him his daughter works long hours for the post office and is not around to help pt most of the day   Facilities manager / plan:  CSW spoke with pt concerning PT recommendation for SNF placement for short term rehab  Employment status:  Retired Forensic scientist:  Medicare PT Recommendations:  [place] / Referral to community resources:  [place]  Patient/Family's Response to care: Pt is agreeable to SNF placement in the [place] area  Patient/Family's Understanding of and Emotional Response to Diagnosis, Current Treatment, and Prognosis:  Pt had no concerns about current prognosis/treatment  Emotional Assessment Appearance:  Appears stated age Attitude/Demeanor/Rapport:    Affect (typically observed):  Calm, Pleasant, Accepting Orientation:  Oriented to Self, Oriented to Place, Oriented to  Time, Oriented  to Situation Alcohol / Substance use:  Not Applicable Psych involvement (Current and /or in the community):  No (Comment)  Discharge Needs  Concerns to be addressed:  Discharge Planning Concerns Readmission within the last 30 days:  No Current discharge risk:  Physical Impairment, Lives alone Barriers to Discharge:  Continued Medical Work up   [employer], LCSW 07/05/2014, 10:38 AM

## 2014-07-06 ENCOUNTER — Inpatient Hospital Stay (HOSPITAL_COMMUNITY): Payer: Medicare Other

## 2014-07-06 ENCOUNTER — Telehealth: Payer: Self-pay | Admitting: Cardiology

## 2014-07-06 ENCOUNTER — Encounter (HOSPITAL_COMMUNITY): Payer: Self-pay | Admitting: Nurse Practitioner

## 2014-07-06 DIAGNOSIS — I5033 Acute on chronic diastolic (congestive) heart failure: Secondary | ICD-10-CM

## 2014-07-06 DIAGNOSIS — N183 Chronic kidney disease, stage 3 unspecified: Secondary | ICD-10-CM

## 2014-07-06 LAB — BASIC METABOLIC PANEL
Anion gap: 7 (ref 5–15)
BUN: 26 mg/dL — AB (ref 6–20)
CHLORIDE: 98 mmol/L — AB (ref 101–111)
CO2: 21 mmol/L — AB (ref 22–32)
Calcium: 8.4 mg/dL — ABNORMAL LOW (ref 8.9–10.3)
Creatinine, Ser: 1.32 mg/dL — ABNORMAL HIGH (ref 0.61–1.24)
GFR calc Af Amer: 55 mL/min — ABNORMAL LOW (ref >60)
GFR calc non Af Amer: 47 mL/min — ABNORMAL LOW (ref >60)
Glucose, Bld: 92 mg/dL (ref 65–99)
POTASSIUM: 4 mmol/L (ref 3.5–5.1)
SODIUM: 126 mmol/L — AB (ref 135–145)

## 2014-07-06 MED ORDER — ISOSORBIDE MONONITRATE ER 30 MG PO TB24: 30.0000 mg | ORAL_TABLET | Freq: Every day | ORAL | Status: DC

## 2014-07-06 MED ORDER — PANTOPRAZOLE SODIUM 40 MG PO TBEC: 40.0000 mg | DELAYED_RELEASE_TABLET | Freq: Every day | ORAL | Status: DC

## 2014-07-06 MED ORDER — CLOPIDOGREL BISULFATE 75 MG PO TABS: 75.0000 mg | ORAL_TABLET | Freq: Every day | ORAL | Status: DC

## 2014-07-06 MED ORDER — METOPROLOL TARTRATE 25 MG PO TABS: 25.0000 mg | ORAL_TABLET | Freq: Two times a day (BID) | ORAL | Status: DC

## 2014-07-06 MED FILL — Sodium Chloride IV Soln 0.9%: INTRAVENOUS | Qty: 2000 | Status: AC

## 2014-07-06 MED FILL — Electrolyte-R (PH 7.4) Solution: INTRAVENOUS | Qty: 3000 | Status: AC

## 2014-07-06 NOTE — Discharge Summary (Signed)
Discharge Summary   Patient ID: Eric Buchanan,  MRN: [id number], DOB/AGE: 79-Oct-1927 79 y.o.  Admit date: 07/02/2014 Discharge date: 07/06/2014  Primary Care Provider: Aundria Mems Primary Cardiologist: B. Stanford Breed, MD TAVR Team: M. Burt Knack, MD / C. Roxy Manns, MD  Discharge Diagnoses Principal Problem:   Severe aortic stenosis   S/P TAVR (transcatheter aortic valve replacement)  **Edwards Sapien 3 THV (size 23 mm)   Active Problems:   COPD (chronic obstructive pulmonary disease)   Coronary artery disease involving coronary bypass graft   Chronic combined systolic and diastolic congestive heart failure   CKD (chronic kidney disease), stage III   Hyperlipidemia   Hypertension  Allergies Allergies  Allergen Reactions  . Beta Adrenergic Blockers     Too low  . Hydrocodone Rash   Procedures  Transcatheter Aortic Valve Replacement 5.27.2016  Successful transcatheter placement of an Edwards Sapien 3 THV (size 23 mm) bioprosthetic Aortic Valve via the transfemoral approach. _____________   2D Echocardiogram 5.28.2016  Study Conclusions  - Left ventricle: The cavity size was normal. Systolic function was   normal. The estimated ejection fraction was in the range of 50%   to 55%. Wall motion was normal; there were no regional wall   motion abnormalities. The study is not technically sufficient to   allow evaluation of LV diastolic function. - Aortic valve: 23 mm Edward-SAPIEN valve sits well in the aortic   position. There is no central or paravalvular leak. Transaortic   gradients are normal. - Mitral valve: There was moderate regurgitation directed   centrally. - Left atrium: The atrium was mildly dilated. - Right ventricle: Systolic function was mildly reduced. - Right atrium: The atrium was normal in size. - Pulmonary arteries: Systolic pressure was mildly to moderately   increased. PA peak pressure: 46 mm Hg (S). - Inferior vena cava: The vessel was normal in  size. - Pericardium, extracardiac: There was no pericardial effusion. _____________   History of Present Illness  81 y/o male with a h/o CAD s/p CABG, HTN, HL, and CKD III.  In 03/2014, he was found to have severe aortic stenosis with moderate to severe mitral regurgitation.  He seen in clinic in March of this year with complaints of right sided chest pain and DOE.  He then underwent right and left heart catheterization in April revealing severe native CAD with a patent LIMA LAD and patent SVG OM2.  He was then evaluated but the multi-disciplinary TAVR team and it was ultimately felt that he would benefit most from TAVR.  Following preoperative imaging, it was determined that he was a suitable candidate for a transfemoral approach and arrangements were made for 07/02/2014.   Hospital Course  Patient presented to [facility] on 07/02/2014 and underwent successfully TAVR using a 10mm Edwards Sapien 3 THV bioprosthetic Aortic Valve via the transfemoral approach.  He tolerated the procedure well and post procedure was maintained on ASA, plavix, and low dose beta blocker therapy.  Follow-up 2D echo on 5/28 revealed normal LV function with no evidence of central or paravalvular leak.  He was seen by PT and was noted to be deconditioned though overall he was progressing well.  It was felt that he would benefit from at least short term skilled nursing and rehabilitation.  After consideration however, Mr. Huskins has opted to go home with home health and PT.  He has been stable and felt to be ready for discharge today.  His previous home diuretic and ACEI therapies have been on  hold in the setting of mild renal insufficiency and hyponatremia.  He will require a f/u bmet within the next week.  Discharge Vitals Blood pressure 123/56, pulse 75, temperature 97.5 F (36.4 C), temperature source Oral, resp. rate 17, weight 142 lb 9.6 oz (64.683 kg), SpO2 98 %.  Filed Weights   07/04/14 0600 07/05/14 0448 07/06/14 0359    Weight: 143 lb 4.8 oz (65 kg) 145 lb 1 oz (65.8 kg) 142 lb 9.6 oz (64.683 kg)   Labs  CBC Lab Results  Component Value Date   WBC 6.0 07/03/2014   HGB 9.8* 07/03/2014   HCT 28.0* 07/03/2014   MCV 92.7 07/03/2014   PLT 162 75/11/2583    Basic Metabolic Panel  Recent Labs  07/05/14 0347 07/06/14 0345  NA 126* 126*  K 4.2 4.0  CL 95* 98*  CO2 23 21*  GLUCOSE 102* 92  BUN 25* 26*  CREATININE 1.43* 1.32*  CALCIUM 8.3* 8.4*   Liver Function Tests Lab Results  Component Value Date   ALT 20 06/30/2014   AST 22 06/30/2014   ALKPHOS 115 06/30/2014   BILITOT 0.9 06/30/2014    Disposition  Pt is being discharged home today in good condition.  Follow-up Plans & Appointments      Follow-up Information    Follow up with Sherren Mocha, MD On 08/11/2014.   Specialty:  Cardiology   Why:  9:30 AM   Contact information:   [address] [address] [phone]       Follow up with [facility] On 08/11/2014.   Specialty:  Cardiology   Why:  8:30 AM   Contact information:   [address] [id number] [place] [phone]      Follow up with [name], MD On 07/07/2014.   Specialty:  Cardiology   Why:  2:45 PM   Contact information:   [place] [address] [phone]       Discharge Medications    Medication List    STOP taking these medications        esomeprazole 40 MG capsule  Commonly known as:  NEXIUM     furosemide 40 MG tablet  Commonly known as:  LASIX     lisinopril-hydrochlorothiazide 20-25 MG per tablet  Commonly known as:  PRINZIDE,ZESTORETIC      TAKE these medications        albuterol 108 (90 BASE) MCG/ACT inhaler  Commonly known as:  PROVENTIL HFA;VENTOLIN HFA  Inhale 1 puff into the lungs every 4 (four) hours as needed for shortness of breath.     allopurinol 100 MG tablet  Commonly known as:  ZYLOPRIM  Take 1  tablet (100 mg total) by mouth daily.     aspirin 81 MG tablet  Take 81 mg by mouth at bedtime.     atorvastatin 40 MG tablet  Commonly known as:  LIPITOR  Take 1 tablet (40 mg total) by mouth daily.     budesonide-formoterol 160-4.5 MCG/ACT inhaler  Commonly known as:  SYMBICORT  Inhale 2 puffs into the lungs 2 (two) times daily.     Capsaicin-Menthol 0.025-1.25 % Ptch  Commonly known as:  SALONPAS GEL  Apply 1 patch topically 3 (three) times daily.     clopidogrel 75 MG tablet  Commonly known as:  PLAVIX  Take 1 tablet (75 mg total) by mouth daily with breakfast.     fluticasone  50 MCG/ACT nasal spray  Commonly known as:  FLONASE  One spray in each nostril twice a day, use left hand for right nostril, and right hand for left nostril.     Glucosamine Sulfate-MSM 500-400 MG Caps  Take by mouth 3 (three) times daily.     isosorbide mononitrate 30 MG 24 hr tablet  Commonly known as:  IMDUR  Take 1 tablet (30 mg total) by mouth daily.     metoprolol tartrate 25 MG tablet  Commonly known as:  LOPRESSOR  Take 1 tablet (25 mg total) by mouth 2 (two) times daily.     mupirocin ointment 2 %  Commonly known as:  BACTROBAN  Place 1 application into the nose 2 (two) times daily.     oxyCODONE-acetaminophen 5-325 MG per tablet  Commonly known as:  PERCOCET/ROXICET  Take 0.5 tablets by mouth every 8 (eight) hours as needed.     pantoprazole 40 MG tablet  Commonly known as:  PROTONIX  Take 1 tablet (40 mg total) by mouth daily.     SPIRIVA RESPIMAT 2.5 MCG/ACT Aers  Generic drug:  Tiotropium Bromide Monohydrate  Inhale 2.5 mg into the lungs every morning.     vitamin C 500 MG tablet  Commonly known as:  ASCORBIC ACID  Take 500 mg by mouth daily.     Vitamin D 2000 UNITS tablet  Take 2,000 Units by mouth.        Outstanding Labs/Studies  F/U BMET w/in next week to reassess renal fxn and consider resumption of diuretic. F/U echo 08/11/2014.  Duration of Discharge  Encounter   Greater than 30 minutes including physician time.  Signed, Murray Hodgkins NP 07/06/2014, 2:12 PM   Patient seen and examined and history reviewed. Agree with above findings and plan. See earlier rounding note. Patient declines SNF placement and wants to go home. PT feels this is appropriate with home PT>  Peter Martinique, [place] 07/06/2014 2:22 PM

## 2014-07-06 NOTE — Discharge Instructions (Signed)
**  PLEASE REMEMBER TO BRING ALL OF YOUR MEDICATIONS TO EACH OF YOUR FOLLOW-UP OFFICE VISITS. ° °Groin Site Care °Refer to this sheet in the next few weeks. These instructions provide you with information on caring for yourself after your procedure. Your caregiver may also give you more specific instructions. Your treatment has been planned according to current medical practices, but problems sometimes occur. Call your caregiver if you have any problems or questions after your procedure. °HOME CARE INSTRUCTIONS °· You may shower 24 hours after the procedure. Remove the bandage (dressing) and gently wash the site with plain soap and water. Gently pat the site dry.  °· Do not apply powder or lotion to the site.  °· Do not sit in a bathtub, swimming pool, or whirlpool for 5 to 7 days.  °· No bending, squatting, or lifting anything over 10 pounds (4.5 kg) as directed by your caregiver.  °· Inspect the site at least twice daily.  °· Do not drive home if you are discharged the same day of the procedure. Have someone else drive you.  °What to expect: °· Any bruising will usually fade within 1 to 2 weeks.  °· Blood that collects in the tissue (hematoma) may be painful to the touch. It should usually decrease in size and tenderness within 1 to 2 weeks.  °SEEK IMMEDIATE MEDICAL CARE IF: °· You have unusual pain at the groin site or down the affected leg.  °· You have redness, warmth, swelling, or pain at the groin site.  °· You have drainage (other than a small amount of blood on the dressing).  °· You have chills.  °· You have a fever or persistent symptoms for more than 72 hours.  °· You have a fever and your symptoms suddenly get worse.  °· Your leg becomes pale, cool, tingly, or numb.  °You have heavy bleeding from the site. Hold pressure on the site. .  °

## 2014-07-06 NOTE — Progress Notes (Addendum)
      [address]       [place] [address]             [phone]      4 Days Post-Op Procedure(s) (LRB): TRANSCATHETER AORTIC VALVE REPLACEMENT, TRANSFEMORAL (N/A) TRANSESOPHAGEAL ECHOCARDIOGRAM (TEE) (N/A) Subjective: Feels well  Objective: Vital signs in last 24 hours: Temp:  [97.5 F (36.4 C)-98.7 F (37.1 C)] 98.7 F (37.1 C) (05/31 0359) Pulse Rate:  [59-73] 73 (05/31 0359) Cardiac Rhythm:  [-] Normal sinus rhythm (05/30 2312) Resp:  [18-20] 18 (05/31 0359) BP: (117-131)/(53-69) 131/69 mmHg (05/31 0359) SpO2:  [97 %-98 %] 98 % (05/31 0359) Weight:  [142 lb 9.6 oz (64.683 kg)] 142 lb 9.6 oz (64.683 kg) (05/31 0359)  Hemodynamic parameters for last 24 hours:    Intake/Output from previous day:   Intake/Output this shift:    General appearance: alert, cooperative and no distress Heart: regular rate and rhythm and no murmur or rub Lungs: minor basilar crackles Abdomen: benign Extremities: no edema Wound: incis healing well  Lab Results: No results for input(s): WBC, HGB, HCT, PLT in the last 72 hours. BMET:  Recent Labs  07/05/14 0347 07/06/14 0345  NA 126* 126*  K 4.2 4.0  CL 95* 98*  CO2 23 21*  GLUCOSE 102* 92  BUN 25* 26*  CREATININE 1.43* 1.32*  CALCIUM 8.3* 8.4*    PT/INR: No results for input(s): LABPROT, INR in the last 72 hours. ABG    Component Value Date/Time   PHART 7.384 07/02/2014 1154   HCO3 19.3* 07/02/2014 1154   TCO2 18 07/02/2014 1819   ACIDBASEDEF 5.0* 07/02/2014 1154   O2SAT 91.0 07/02/2014 1154   CBG (last 3)   Recent Labs  07/04/14 0917 07/04/14 1255 07/04/14 1608  GLUCAP 116* 103* 133*    Meds Scheduled Meds: . acetaminophen  1,000 mg Oral 4 times per day   Or  . acetaminophen (TYLENOL) oral liquid 160 mg/5 mL  1,000 mg Per Tube 4 times per day  . acetaminophen  650 mg Rectal Once  . allopurinol  100 mg Oral Daily  . aspirin EC  81 mg Oral QHS  . atorvastatin  40 mg Oral q1800  .  budesonide-formoterol  2 puff Inhalation BID  . Chlorhexidine Gluconate Cloth  6 each Topical Q0600  . clopidogrel  75 mg Oral Q breakfast  . fluticasone  1 spray Each Nare Daily  . furosemide  40 mg Oral Daily  . isosorbide mononitrate  30 mg Oral Daily  . metoprolol tartrate  25 mg Oral BID  . mupirocin ointment  1 application Nasal BID  . MUSCLE RUB   Topical TID  . pantoprazole  40 mg Oral Daily  . tiotropium  18 mcg Inhalation Daily   Continuous Infusions:  PRN Meds:.sodium chloride, albuterol, ondansetron (ZOFRAN) IV, sodium chloride, traMADol  Xrays No results found.  Assessment/Plan: S/P Procedure(s) (LRB): TRANSCATHETER AORTIC VALVE REPLACEMENT, TRANSFEMORAL (N/A) TRANSESOPHAGEAL ECHOCARDIOGRAM (TEE) (N/A)   Doing well, to SNF when bed avail Cardiology is managing medical aspects of care   LOS: 4 days    GOLD,WAYNE E 07/06/2014  I have seen and examined the patient and agree with the assessment and plan as outlined.  Looks great.  Only complaint is dry cough.  Feels much better than prior to surgery.  Rexene Alberts 07/06/2014 9:39 AM

## 2014-07-06 NOTE — Telephone Encounter (Signed)
Eric Buchanan is calling because she is needing orders for [facility] PT and Face to Face and DME walker . Please call    Thanks

## 2014-07-06 NOTE — Progress Notes (Signed)
    Subjective:  Doing well this morning. Still with mild right groin soreness - taking Tylenol. No other complaints today. Able to ambulate some yesterday. States he is able to do more than prior to TAVR.  Objective:  Vital Signs in the last 24 hours: Temp:  [97.5 F (36.4 C)-98.7 F (37.1 C)] 98.7 F (37.1 C) (05/31 0359) Pulse Rate:  [59-73] 73 (05/31 0359) Resp:  [18-20] 18 (05/31 0359) BP: (117-131)/(53-69) 131/69 mmHg (05/31 0359) SpO2:  [97 %-98 %] 98 % (05/31 0359) Weight:  [64.683 kg (142 lb 9.6 oz)] 64.683 kg (142 lb 9.6 oz) (05/31 0359)  Intake/Output from previous day:    Physical Exam: Pt is alert and oriented, elderly male in NAD HEENT: normal Neck: JVP - normal Lungs: CTA bilaterally CV: RRR without murmur or gallop Abd: soft, NT, Positive BS, no hepatomegaly Ext: no C/C/E, distal pulses intact and equal, right groin incision healing well. Left groin dressing removed - site clear. Skin: warm/dry no rash   Lab Results: No results for input(s): WBC, HGB, PLT in the last 72 hours.  Recent Labs  07/05/14 0347 07/06/14 0345  NA 126* 126*  K 4.2 4.0  CL 95* 98*  CO2 23 21*  GLUCOSE 102* 92  BUN 25* 26*  CREATININE 1.43* 1.32*   No results for input(s): TROPONINI in the last 72 hours.  Invalid input(s): CK, MB  Tele: Personally reviewed: sinus rhythm with PAC's, PVC's- some rare couplets/triplets. No AV block.  Assessment/Plan:  1. Acute on chronic diastolic heart failure, improved with TAVR, diuresis. Lasix on hold due to hyponatremia. Appears euvolemic now. 2. Severe AS s/p TAVR POD#4 3. Severe MR, now improved and assessed as moderate by post-op echo 4. Hyponatremia, chronic - Na stable at 126 today. Hold lasix. 5. Anemia, mild, stable 6. Essential hypertension - BP controlled on current Rx. 7. CAD - no angina at present 8. CKD Stage 3 - creatinine improved to 1.32. Will hold lasix again today.  Medically stable. Continue to hold lasix.   Social work consult placed awaiting SNF bed. FL-2 signed. Anticipate dc pending bed availability.  Peter Martinique, M.D. 07/06/2014, 8:22 AM

## 2014-07-06 NOTE — Care Management Note (Addendum)
Case Management Note  Patient Details  Name: Eric Buchanan MRN: [id number] Date of Birth: 1925-05-25  Subjective/Objective:      Pt is s/p TAVR              Action/Plan:  PT originally recommneded SNF, however pt refused.  CM requested new PT eval and will request recommendations from MD post eval.     Expected Discharge Date:                  Expected Discharge Plan:  [place]  In-House Referral:  Clinical Social Work  Discharge planning Services  CM Consult  Post Acute Care Choice:   Home with [place] Choice offered to:     DME Arranged:   rolling walker DME Agency:    [place]  HH Arranged:   PT, RN [facility] Agency:   [place]  Status of Service:  Complete, will sign off  Medicare Important Message Given:  Yes Date Medicare IM Given:  07/06/14 Medicare IM give by:  Elenor Quinones Date Additional Medicare IM Given:    Additional Medicare Important Message give by:      Disposition Plan:  Home with [place]  If discussed at [place] of Stay Meetings, dates discussed:    Additional Comments: 07/06/14 Elenor Quinones, RN, BSN [phone].  CM offered pt choice for [facility] services, pt chose advanced home care.  CM contacted advanced DME and HH, both referrals accepted.  CM requested MD to write [facility], DME order and face to face order.  Nurse was requested by Pt daughter to provide information regarding a sitter for pt at home,  Pt refused nurses aid, stated he didn't need that.  CM provided off duty sitter list and informed that it would not be covered by insurance.  No additional CM needs at this time.   07/06/14 Elenor Quinones, RN, BSN [phone].  SW consulted for SNF placement, pt is refusing SNF and request HH.  Pt is aware of PT recommendation for 24 hour supervision, per pt he will not have 24 hour supervision but he continues to refuse SNF.  CM will continue to follow post PT reevaluation and  request [facility] recommendations from MD.  CM made bedside nurse aware of pending discharge disposition change.  CM will continue to monitor for disposition needs.    Maryclare Labrador, RN 07/06/2014, 10:29 AM

## 2014-07-06 NOTE — Telephone Encounter (Signed)
Spoke with samantha, she has what is needed.

## 2014-07-06 NOTE — Progress Notes (Addendum)
Physical Therapy Treatment Patient Details Name: Eric Buchanan MRN: [id number] DOB: [birth year] Today's Date: 07/06/2014    History of Present Illness Pt is an 81 y/o male with a PMH of severe aortic stenosis. Pt is now s/p TAVR procedure and TEE on 07/02/14. `    PT Comments    Mr. Emily did well with ambulation and balance activities today. He does admit to imbalance (denies falls) at home and "furniture walks." Agreed to use RW at home and to [facility] for safety evaluation. Demonstrated good knowledge of use of RW, required minimal cuing and then able to recall and carryover instructions. Reports he is moving better than prior to surgery (not just breathing better, but feels steadier, stronger). Agree with plan for discharger home with daughter providing prn supervision/assistance with RW and HHPT.    Follow Up Recommendations  Home health PT;Supervision for mobility/OOB     Equipment Recommendations  Rolling walker with 5" wheels    Recommendations for Other Services       Precautions / Restrictions Precautions Precautions: Fall Precaution Comments: denies falls at home, but admits furniture walks and "bounces off walls in hallway" Restrictions Weight Bearing Restrictions: No    Mobility  Bed Mobility               General bed mobility comments: Pt was sitting up in recliner chair upon PT arrival.   Transfers Overall transfer level: Modified independent Equipment used: Rolling walker (2 wheeled);None Transfers: Sit to/from Stand Sit to Stand: Modified independent (Device/Increase time)         General transfer comment: with and without walker pt steady and no cues needed  Ambulation/Gait Ambulation/Gait assistance: Min guard;Supervision Ambulation Distance (Feet): 200 Feet Assistive device: Rolling walker (2 wheeled);None Gait Pattern/deviations: Step-through pattern;Decreased stride length;Trunk flexed Gait velocity: Decreased   General Gait Details: Ambulated  in room without RW and no unsteadiness noted. Pt agreed to RW use in hallway and required min cues for proper use. No imbalance noted and pt reports he feels better than prior to surgery   Stairs Stairs:  (deferred; pt able to verbalize safe technique & no difficulties anticipated; pt agrees)          Engineer, building services [place] (Stroke Patients Only)       Balance     Sitting balance-Leahy Scale: Good     Standing balance support: No upper extremity supported Standing balance-Leahy Scale: Good Standing balance comment: able to wash hands at sink without propping against counter                    Cognition Arousal/Alertness: Awake/alert Behavior During Therapy: WFL for tasks assessed/performed Overall Cognitive Status: Within Functional Limits for tasks assessed                      Exercises      General Comments General comments (skin integrity, edema, etc.): Pt reports his daughter is in agreement with him discharging home      Pertinent Vitals/Pain On room air, SaO2 96% after ambulating 200 ft without rest break  Pain Assessment: No/denies pain    Home Living                      Prior Function            PT Goals (current goals can now be found in the care plan section) Acute Rehab PT Goals Patient Stated Goal: Exceed  PLOF PT Goal Formulation: With patient Time For Goal Achievement: 07/19/14 Potential to Achieve Goals: Good Progress towards PT goals: Progressing toward goals    Frequency  Min 3X/week    PT Plan Discharge plan needs to be updated    Co-evaluation             End of Session   Activity Tolerance: Patient tolerated treatment well Patient left: in chair;with call bell/phone within reach     Time: 1119-1143 PT Time Calculation (min) (ACUTE ONLY): 24 min  Charges:  $Gait Training: 23-37 mins                    G Codes:      Lisset Ketchem 08/02/2014, 11:56 AM Pager [phone]

## 2014-07-06 NOTE — Progress Notes (Signed)
Pt D/C home per W/C. IV removed. Skin clean, dry & intact. Discharge instructions reviewed with pt and pts daughter. All questions answered and concerns addressed. Pt sent home with walker. Pt verbalized understanding.   Raliegh Ip RN

## 2014-07-06 NOTE — Progress Notes (Signed)
CSW presented bed offers to patient.  Patient states that he would prefer to go home and does not think that he is at risk in is home, states that if he went to SNF he would only stay for 3 days- pt was moving independently in the room when CSW arrived to give bed offers.  CSW informed RNCM of patient choice to go home with home health services.  CSW signing off.  Domenica Reamer, [place] Social Worker [phone]

## 2014-07-06 NOTE — Progress Notes (Signed)
UR Completed. Mrk Buzby, RN, BSN.  [phone] 

## 2014-07-07 ENCOUNTER — Encounter: Payer: Self-pay | Admitting: Cardiology

## 2014-07-07 ENCOUNTER — Telehealth: Payer: Self-pay

## 2014-07-07 ENCOUNTER — Ambulatory Visit (INDEPENDENT_AMBULATORY_CARE_PROVIDER_SITE_OTHER): Payer: Medicare Other | Admitting: Cardiology

## 2014-07-07 VITALS — BP 132/60 | HR 84 | Ht 65.0 in | Wt 143.1 lb

## 2014-07-07 DIAGNOSIS — M5412 Radiculopathy, cervical region: Secondary | ICD-10-CM

## 2014-07-07 DIAGNOSIS — Z954 Presence of other heart-valve replacement: Secondary | ICD-10-CM

## 2014-07-07 DIAGNOSIS — E785 Hyperlipidemia, unspecified: Secondary | ICD-10-CM

## 2014-07-07 DIAGNOSIS — I2510 Atherosclerotic heart disease of native coronary artery without angina pectoris: Secondary | ICD-10-CM

## 2014-07-07 DIAGNOSIS — Z952 Presence of prosthetic heart valve: Secondary | ICD-10-CM

## 2014-07-07 DIAGNOSIS — I5042 Chronic combined systolic (congestive) and diastolic (congestive) heart failure: Secondary | ICD-10-CM

## 2014-07-07 DIAGNOSIS — I10 Essential (primary) hypertension: Secondary | ICD-10-CM

## 2014-07-07 MED ORDER — OXYCODONE-ACETAMINOPHEN 5-325 MG PO TABS: 0.5000 | ORAL_TABLET | Freq: Three times a day (TID) | ORAL | Status: DC | PRN

## 2014-07-07 MED FILL — Heparin Sodium (Porcine) Inj 1000 Unit/ML: INTRAMUSCULAR | Qty: 30 | Status: AC

## 2014-07-07 MED FILL — Magnesium Sulfate Inj 50%: INTRAMUSCULAR | Qty: 10 | Status: AC

## 2014-07-07 MED FILL — Potassium Chloride Inj 2 mEq/ML: INTRAVENOUS | Qty: 40 | Status: AC

## 2014-07-07 MED FILL — Insulin Regular (Human) Inj 100 Unit/ML: INTRAMUSCULAR | Qty: 2.5 | Status: AC

## 2014-07-07 NOTE — Assessment & Plan Note (Signed)
Continue statin. 

## 2014-07-07 NOTE — Assessment & Plan Note (Signed)
Continue aspirin and statin. 

## 2014-07-07 NOTE — Telephone Encounter (Signed)
Patient request refill for Oxycodone. [name],CMA  

## 2014-07-07 NOTE — Assessment & Plan Note (Signed)
Patient's Lasix was not resumed at discharge. He is doing well symptomatically and we will resume later if needed.

## 2014-07-07 NOTE — Assessment & Plan Note (Signed)
Patient doing remarkably well following recent procedure. Continue aspirin and Plavix. Continue SBE prophylaxis. Symptoms much improved.

## 2014-07-07 NOTE — Patient Instructions (Signed)
Your physician recommends that you schedule a follow-up appointment in: 3 MONTHS WITH DR CRENSHAW  

## 2014-07-07 NOTE — Progress Notes (Signed)
HPI: FU AVR. Abdominal ultrasound October 2013 showed no aneurysm. Echocardiogram February 2016 showed normal LV function and severe aortic stenosis. Cardiac catheterization April 2016 showed severe three-vessel coronary artery disease. The LIMA to the LAD was patent. Saphenous vein graft to the second marginal was also patent but there was note of a 75% stenosis in the mid graft. There was moderate pulmonary hypertension. CTA May 2016 showed tiny pulmonary nodules and follow-up recommended in 6-12 months. There is note of mild fusiform dilatation of the abdominal aorta measuring 2.5 x 2.4 cm. Patient underwent TAVR in May 2016. Follow-up echocardiogram showed normal LV function. The aortic valve was well-seated with no aortic insufficiency. There was moderate mitral regurgitation and mild left atrial enlargement. Mild to moderately elevated pulmonary pressures. Since last seen he is recovering well from his recent procedure. He states his dyspnea on exertion has improved. He denies orthopnea, PND, pedal edema, chest pain or syncope.  Current Outpatient Prescriptions  Medication Sig Dispense Refill  . albuterol (PROVENTIL HFA;VENTOLIN HFA) 108 (90 BASE) MCG/ACT inhaler Inhale 1 puff into the lungs every 4 (four) hours as needed for shortness of breath.     . allopurinol (ZYLOPRIM) 100 MG tablet Take 1 tablet (100 mg total) by mouth daily. 90 tablet 3  . aspirin 81 MG tablet Take 81 mg by mouth at bedtime.     Marland Kitchen atorvastatin (LIPITOR) 40 MG tablet Take 1 tablet (40 mg total) by mouth daily. 90 tablet 3  . budesonide-formoterol (SYMBICORT) 160-4.5 MCG/ACT inhaler Inhale 2 puffs into the lungs 2 (two) times daily. 1 Inhaler 0  . Capsaicin-Menthol (SALONPAS GEL) 0.025-1.25 % PTCH Apply 1 patch topically 3 (three) times daily.    . Cholecalciferol (VITAMIN D) 2000 UNITS tablet Take 2,000 Units by mouth.    . clopidogrel (PLAVIX) 75 MG tablet Take 1 tablet (75 mg total) by mouth daily with breakfast.  30 tablet 5  . fluticasone (FLONASE) 50 MCG/ACT nasal spray One spray in each nostril twice a day, use left hand for right nostril, and right hand for left nostril. 48 g 0  . Glucosamine Sulfate-MSM 500-400 MG CAPS Take by mouth 3 (three) times daily.    . isosorbide mononitrate (IMDUR) 30 MG 24 hr tablet Take 1 tablet (30 mg total) by mouth daily. 30 tablet 6  . metoprolol tartrate (LOPRESSOR) 25 MG tablet Take 1 tablet (25 mg total) by mouth 2 (two) times daily. 60 tablet 6  . mupirocin ointment (BACTROBAN) 2 % Place 1 application into the nose 2 (two) times daily.    Marland Kitchen oxyCODONE-acetaminophen (PERCOCET/ROXICET) 5-325 MG per tablet Take 0.5 tablets by mouth every 8 (eight) hours as needed. 45 tablet 0  . pantoprazole (PROTONIX) 40 MG tablet Take 1 tablet (40 mg total) by mouth daily. 30 tablet 6  . Tiotropium Bromide Monohydrate (SPIRIVA RESPIMAT) 2.5 MCG/ACT AERS Inhale 2.5 mg into the lungs every morning.    . vitamin C (ASCORBIC ACID) 500 MG tablet Take 500 mg by mouth daily.     No current facility-administered medications for this visit.     Past Medical History  Diagnosis Date  . Hypertension   . Asthma   . Gout   . Hyperlipidemia   . Chronic kidney disease   . Arthritis   . Aortic stenosis   . CAD (coronary artery disease)   . Stroke     1965  . Inguinal hernia, right 05/15/2013  . COPD (chronic obstructive pulmonary disease) 10/11/2011  .  Chronic kidney disease (CKD)   . Coronary artery disease involving native coronary artery   . Coronary artery disease involving coronary bypass graft 05/10/2014  . Zenker's diverticulum 05/21/2014  . Chronic combined systolic and diastolic congestive heart failure   . Mitral regurgitation   . 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 Anginal pain   . Shortness of breath dyspnea   . GERD (gastroesophageal reflux disease)   . Cancer     SKIN CANCER REMOVED  L ELBOW  . 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 07/02/2014 TAVR: 23 mm Edwards Sapien 3 transcatheter heart valve placed via open right transfemoral approach;  b. 07/03/2014 Echo: EF 50-55%, no central or paravalvular leak, nl transaortic gradients, mod MR, mildly dil LA, mildly reduced RV fxn, PASP 46 mmHg.    Past Surgical History  Procedure Laterality Date  . Left and right heart catheterization with coronary/graft angiogram N/A 05/10/2014    Procedure: LEFT AND RIGHT HEART CATHETERIZATION WITH Beatrix Fetters;  Surgeon: Sherren Mocha, MD;  Location: [facility] CATH LAB;  Service: Cardiovascular;  Laterality: N/A;  . Cardiac catheterization    . Coronary artery bypass graft  01/06/1997    LIMA to LAD, SVG to OM, open SVG harvest right lower leg - [facility]  . Tee without cardioversion N/A 05/17/2014    Procedure: TRANSESOPHAGEAL ECHOCARDIOGRAM (TEE);  Surgeon: Lelon Perla, MD;  Location: [facility] ENDOSCOPY;  Service: Cardiovascular;  Laterality: N/A;  . Transcatheter aortic valve replacement, transfemoral N/A 07/02/2014    Procedure: TRANSCATHETER AORTIC VALVE REPLACEMENT, TRANSFEMORAL;  Surgeon: Sherren Mocha, MD;  Location: [place];  Service: Open Heart Surgery;  Laterality: N/A;  . Tee without cardioversion N/A 07/02/2014    Procedure: TRANSESOPHAGEAL ECHOCARDIOGRAM (TEE);  Surgeon: Sherren Mocha, MD;  Location: [place];  Service: Open Heart Surgery;  Laterality: N/A;    History   Social History  . Marital Status: Widowed    Spouse Name: N/A  . Number of Children: 3  . Years of Education: N/A   Occupational History  . Not on file.   Social History Main Topics  . Smoking status: Former Smoker    Quit date: 02/06/1963  . Smokeless tobacco: Not on file  . Alcohol Use: 0.0 oz/week    0 Standard drinks or equivalent per week     Comment: One beer per day  . Drug Use: No  . Sexual Activity: Not Currently   Other Topics Concern  . Not on file    Social History Narrative    ROS: no fevers or chills, productive cough, hemoptysis, dysphasia, odynophagia, melena, hematochezia, dysuria, hematuria, rash, seizure activity, orthopnea, PND, pedal edema, claudication. Remaining systems are negative.  Physical Exam: Well-developed frail in no acute distress.  Skin is warm and dry. Diffuse ecchymosis HEENT is normal.  Neck is supple.  Chest is clear to auscultation with normal expansion.  Cardiovascular exam is regular rate and rhythm. 2/6 systolic murmur; no diastolic murmur  Abdominal exam nontender or distended. No masses palpated. Right groin incision with no hematoma and no evidence of infection. No bruit. Left groin with no hematoma or bruit. Extremities show trace edema. neuro grossly intact

## 2014-07-07 NOTE — Assessment & Plan Note (Signed)
Blood pressure controlled. Continue present medications. 

## 2014-07-08 ENCOUNTER — Telehealth: Payer: Self-pay | Admitting: Cardiology

## 2014-07-08 LAB — TYPE AND SCREEN
ABO/RH(D): O POS
Antibody Screen: POSITIVE
DAT, IgG: NEGATIVE
DONOR AG TYPE: NEGATIVE
Donor AG Type: NEGATIVE
Donor AG Type: NEGATIVE
Donor AG Type: NEGATIVE
PT AG Type: POSITIVE
UNIT DIVISION: 0
UNIT DIVISION: 0
Unit division: 0
Unit division: 0

## 2014-07-08 NOTE — Telephone Encounter (Signed)
6.2.16 Received FMLA forms for patients daughter from [employer].  Patient was seen in [place] office on 07/07/14.  Sent FMLA forms to [employer] for letter/package authorization to be sent.

## 2014-07-20 ENCOUNTER — Telehealth: Payer: Self-pay

## 2014-07-20 MED ORDER — FUROSEMIDE 20 MG PO TABS: 20.0000 mg | ORAL_TABLET | Freq: Every day | ORAL | Status: DC

## 2014-07-20 NOTE — Telephone Encounter (Signed)
Patient called stated that he needs a refill on his Lisinopril. It was last filled in May and is now under the historical meds. Please advise if patient should still be taking this medication or if it has changed. Cammie Faulstich,CMA

## 2014-07-20 NOTE — Telephone Encounter (Signed)
Prescription will be sent. I want him to track his weight every day and write the numbers down.

## 2014-07-20 NOTE — Telephone Encounter (Signed)
Patient called stated that he is retaining fluid in his ankles. Patient wants to know if he can get a Rx Furosemide sent to his local pharmacy. Please advise on what to tell the patient. Ginnie Marich,CMA

## 2014-07-20 NOTE — Telephone Encounter (Signed)
Yes, that would be fine.

## 2014-07-20 NOTE — Telephone Encounter (Signed)
Spoke to patient gave him advise as noted below. Eric Buchanan,CMA  

## 2014-07-21 ENCOUNTER — Other Ambulatory Visit: Payer: Self-pay

## 2014-07-21 MED ORDER — LISINOPRIL-HYDROCHLOROTHIAZIDE 20-25 MG PO TABS: 0.5000 | ORAL_TABLET | Freq: Every day | ORAL | Status: DC

## 2014-07-21 NOTE — Telephone Encounter (Signed)
Lisinopril has been sent to  Mail order pharmacy. [name],CMA

## 2014-07-26 ENCOUNTER — Telehealth: Payer: Self-pay

## 2014-07-26 MED ORDER — CHLORPROMAZINE HCL 25 MG PO TABS: 25.0000 mg | ORAL_TABLET | Freq: Three times a day (TID) | ORAL | Status: DC | PRN

## 2014-07-26 NOTE — Telephone Encounter (Signed)
Short course of thorazine rx'ed, come in if no better in a week or 2.

## 2014-07-26 NOTE — Telephone Encounter (Signed)
PATIENT HAS BEEN INFORMED. Eric Buchanan,CMA  

## 2014-07-26 NOTE — Telephone Encounter (Signed)
Patient called stated that he has had the hiccups and acid reflux all night patient stated that has doubled up on the Nexium and that is not helping. Patient wants to know what else he can take for it. Faustine Tates,CMA

## 2014-07-29 ENCOUNTER — Ambulatory Visit (INDEPENDENT_AMBULATORY_CARE_PROVIDER_SITE_OTHER): Payer: Medicare Other | Admitting: Sports Medicine

## 2014-07-29 ENCOUNTER — Encounter: Payer: Self-pay | Admitting: Sports Medicine

## 2014-07-29 VITALS — BP 104/54 | HR 53 | Wt 136.0 lb

## 2014-07-29 DIAGNOSIS — I2510 Atherosclerotic heart disease of native coronary artery without angina pectoris: Secondary | ICD-10-CM

## 2014-07-29 DIAGNOSIS — Z954 Presence of other heart-valve replacement: Secondary | ICD-10-CM

## 2014-07-29 DIAGNOSIS — F32A Depression, unspecified: Secondary | ICD-10-CM | POA: Insufficient documentation

## 2014-07-29 DIAGNOSIS — F329 Major depressive disorder, single episode, unspecified: Secondary | ICD-10-CM

## 2014-07-29 DIAGNOSIS — M1712 Unilateral primary osteoarthritis, left knee: Secondary | ICD-10-CM | POA: Diagnosis not present

## 2014-07-29 DIAGNOSIS — F320 Major depressive disorder, single episode, mild: Secondary | ICD-10-CM | POA: Insufficient documentation

## 2014-07-29 DIAGNOSIS — I5042 Chronic combined systolic (congestive) and diastolic (congestive) heart failure: Secondary | ICD-10-CM

## 2014-07-29 DIAGNOSIS — Z952 Presence of prosthetic heart valve: Secondary | ICD-10-CM

## 2014-07-29 MED ORDER — MIRTAZAPINE 15 MG PO TABS: 15.0000 mg | ORAL_TABLET | Freq: Every day | ORAL | Status: DC

## 2014-07-29 NOTE — Assessment & Plan Note (Signed)
With poor appetite.  Starting mirtazapine 15 mg daily.

## 2014-07-29 NOTE — Assessment & Plan Note (Addendum)
Steroid injection as well as Orthovisc today, discussed with interventional cardiologist. Return in one week for Orthovisc No. 2 of 4

## 2014-07-29 NOTE — Assessment & Plan Note (Addendum)
Slightly bradycardic and minimally hypotensive. I'm going to decrease his furosemide to one half tab daily as well as his metoprolol to one half tab twice a day. Return in one week as above. He is euvolemic today, he has actually come down a good 5-10 pounds since his pre-aortic valve replacement weight, I think the majority of his lower extremity edema is dependent edema related to his knee osteoarthritis.

## 2014-07-29 NOTE — Progress Notes (Signed)
  Subjective:    CC: follow-up  HPI: Left knee osteoarthritis: Right knee is doing okay, left knee still hurts, we drained at the last visit however we did not inject due to an upcoming aortic valve replacement. He has had the aortic valve replacement, I discussed this with his cardiologist, heart surgeon, and they are amenable to me moving forward with steroidal injection. We haven't done viscous supplementation in over a year, he is amenable to start this as well. He is feeling much better from a cardiac standpoint however his knee is what is preventing him from moving more effectively.  Aortic valve replacement, transcatheter: One month out, doing extremely well, his blood pressure and heart rate is a bit low, but overall he breathes better, denies any chest pain or presyncope.  Cervical spondylosis: Per cardiology he does need to be on aspirin and Plavix together for at least 3 months without stop, and 6 months total before considering discontinuation, we would need to stop blood thinners prior to a cervical epidural so we will hold off on this for now.  Past medical history, Surgical history, Family history not pertinant except as noted below, Social history, Allergies, and medications have been entered into the medical record, reviewed, and no changes needed.   Review of Systems: No fevers, chills, night sweats, weight loss, chest pain, or shortness of breath.   Objective:    General: Well Developed, well nourished, and in no acute distress.  Neuro: Alert and oriented x3, extra-ocular muscles intact, sensation grossly intact.  HEENT: Normocephalic, atraumatic, pupils equal round reactive to light, neck supple, no masses, no lymphadenopathy, thyroid nonpalpable.  Skin: Warm and dry, no rashes. Cardiac: Regular rate and rhythm, pansystolic murmur heard, 1-2 + lower extremity edema.  Respiratory: Clear to auscultation bilaterally. Not using accessory muscles, speaking in full  sentences.  Procedure: Real-time Ultrasound Guided aspiration/Injection of left knee Device: GE Logiq E  Verbal informed consent obtained.  Time-out conducted.  Noted no overlying erythema, induration, or other signs of local infection.  Skin prepped in a sterile fashion.  Local anesthesia: Topical Ethyl chloride.  With sterile technique and under real time ultrasound guidance:  Aspirated 20 mL of straw-colored fluid, syringe switched and 2 mL kenalog 40, 4 mL lidocaine injected easily, syringe again switched and 30 mg/2 mL of OrthoVisc (sodium hyaluronate) in a prefilled syringe was injected easily into the knee through a 22-gauge needle. Completed without difficulty  Pain immediately resolved suggesting accurate placement of the medication.  Advised to call if fevers/chills, erythema, induration, drainage, or persistent bleeding.  Images permanently stored and available for review in the ultrasound unit.  Impression: Technically successful ultrasound guided injection.  Impression and Recommendations:

## 2014-07-30 ENCOUNTER — Inpatient Hospital Stay (HOSPITAL_COMMUNITY)
Admission: EM | Admit: 2014-07-30 | Discharge: 2014-08-02 | DRG: 280 | Disposition: A | Discharge status: Other Acute Inpatient Hospital | Payer: Medicare Other | Attending: Cardiovascular Disease | Admitting: Cardiovascular Disease

## 2014-07-30 ENCOUNTER — Emergency Department (HOSPITAL_COMMUNITY): Payer: Medicare Other

## 2014-07-30 ENCOUNTER — Encounter (HOSPITAL_COMMUNITY): Payer: Self-pay | Admitting: Physical Medicine and Rehabilitation

## 2014-07-30 DIAGNOSIS — E785 Hyperlipidemia, unspecified: Secondary | ICD-10-CM | POA: Diagnosis present

## 2014-07-30 DIAGNOSIS — I209 Angina pectoris, unspecified: Secondary | ICD-10-CM

## 2014-07-30 DIAGNOSIS — I2510 Atherosclerotic heart disease of native coronary artery without angina pectoris: Secondary | ICD-10-CM | POA: Diagnosis present

## 2014-07-30 DIAGNOSIS — N183 Chronic kidney disease, stage 3 (moderate): Secondary | ICD-10-CM | POA: Diagnosis present

## 2014-07-30 DIAGNOSIS — J45909 Unspecified asthma, uncomplicated: Secondary | ICD-10-CM | POA: Diagnosis present

## 2014-07-30 DIAGNOSIS — Z85828 Personal history of other malignant neoplasm of skin: Secondary | ICD-10-CM | POA: Diagnosis not present

## 2014-07-30 DIAGNOSIS — N179 Acute kidney failure, unspecified: Secondary | ICD-10-CM | POA: Diagnosis present

## 2014-07-30 DIAGNOSIS — Z8673 Personal history of transient ischemic attack (TIA), and cerebral infarction without residual deficits: Secondary | ICD-10-CM | POA: Diagnosis not present

## 2014-07-30 DIAGNOSIS — K219 Gastro-esophageal reflux disease without esophagitis: Secondary | ICD-10-CM | POA: Diagnosis present

## 2014-07-30 DIAGNOSIS — Z951 Presence of aortocoronary bypass graft: Secondary | ICD-10-CM

## 2014-07-30 DIAGNOSIS — R042 Hemoptysis: Secondary | ICD-10-CM

## 2014-07-30 DIAGNOSIS — I129 Hypertensive chronic kidney disease with stage 1 through stage 4 chronic kidney disease, or unspecified chronic kidney disease: Secondary | ICD-10-CM | POA: Diagnosis present

## 2014-07-30 DIAGNOSIS — Z885 Allergy status to narcotic agent status: Secondary | ICD-10-CM

## 2014-07-30 DIAGNOSIS — I5033 Acute on chronic diastolic (congestive) heart failure: Secondary | ICD-10-CM | POA: Diagnosis present

## 2014-07-30 DIAGNOSIS — I350 Nonrheumatic aortic (valve) stenosis: Secondary | ICD-10-CM | POA: Diagnosis present

## 2014-07-30 DIAGNOSIS — Z7982 Long term (current) use of aspirin: Secondary | ICD-10-CM

## 2014-07-30 DIAGNOSIS — J449 Chronic obstructive pulmonary disease, unspecified: Secondary | ICD-10-CM | POA: Diagnosis present

## 2014-07-30 DIAGNOSIS — Z954 Presence of other heart-valve replacement: Secondary | ICD-10-CM

## 2014-07-30 DIAGNOSIS — I509 Heart failure, unspecified: Secondary | ICD-10-CM

## 2014-07-30 DIAGNOSIS — R918 Other nonspecific abnormal finding of lung field: Secondary | ICD-10-CM | POA: Diagnosis present

## 2014-07-30 DIAGNOSIS — Z87891 Personal history of nicotine dependence: Secondary | ICD-10-CM | POA: Diagnosis not present

## 2014-07-30 DIAGNOSIS — Z888 Allergy status to other drugs, medicaments and biological substances status: Secondary | ICD-10-CM

## 2014-07-30 DIAGNOSIS — Z79899 Other long term (current) drug therapy: Secondary | ICD-10-CM

## 2014-07-30 DIAGNOSIS — I214 Non-ST elevation (NSTEMI) myocardial infarction: Secondary | ICD-10-CM | POA: Diagnosis present

## 2014-07-30 DIAGNOSIS — Z79891 Long term (current) use of opiate analgesic: Secondary | ICD-10-CM | POA: Diagnosis not present

## 2014-07-30 DIAGNOSIS — I340 Nonrheumatic mitral (valve) insufficiency: Secondary | ICD-10-CM | POA: Diagnosis present

## 2014-07-30 DIAGNOSIS — R0902 Hypoxemia: Secondary | ICD-10-CM | POA: Diagnosis present

## 2014-07-30 DIAGNOSIS — I5042 Chronic combined systolic (congestive) and diastolic (congestive) heart failure: Secondary | ICD-10-CM

## 2014-07-30 DIAGNOSIS — Z952 Presence of prosthetic heart valve: Secondary | ICD-10-CM

## 2014-07-30 DIAGNOSIS — Z7902 Long term (current) use of antithrombotics/antiplatelets: Secondary | ICD-10-CM

## 2014-07-30 DIAGNOSIS — M109 Gout, unspecified: Secondary | ICD-10-CM | POA: Diagnosis present

## 2014-07-30 HISTORY — DX: Unspecified malignant neoplasm of skin, unspecified: C44.90

## 2014-07-30 HISTORY — DX: Pain in left knee: M25.562

## 2014-07-30 HISTORY — DX: Cervicalgia: M54.2

## 2014-07-30 HISTORY — DX: Other chronic pain: G89.29

## 2014-07-30 HISTORY — DX: Non-ST elevation (NSTEMI) myocardial infarction: I21.4

## 2014-07-30 LAB — BASIC METABOLIC PANEL
Anion gap: 12 (ref 5–15)
Anion gap: 14 (ref 5–15)
BUN: 42 mg/dL — ABNORMAL HIGH (ref 6–20)
BUN: 40 mg/dL — ABNORMAL HIGH (ref 6–20)
CO2: 20 mmol/L — ABNORMAL LOW (ref 22–32)
CO2: 19 mmol/L — ABNORMAL LOW (ref 22–32)
CREATININE: 1.89 mg/dL — AB (ref 0.61–1.24)
Calcium: 9.4 mg/dL (ref 8.9–10.3)
Calcium: 9.2 mg/dL (ref 8.9–10.3)
Chloride: 98 mmol/L — ABNORMAL LOW (ref 101–111)
Chloride: 99 mmol/L — ABNORMAL LOW (ref 101–111)
Creatinine, Ser: 1.79 mg/dL — ABNORMAL HIGH (ref 0.61–1.24)
GFR calc Af Amer: 38 mL/min — ABNORMAL LOW (ref >60)
GFR calc non Af Amer: 31 mL/min — ABNORMAL LOW (ref >60)
GFR calc non Af Amer: 33 mL/min — ABNORMAL LOW (ref >60)
GFR, EST AFRICAN AMERICAN: 35 mL/min — AB (ref >60)
Glucose, Bld: 154 mg/dL — ABNORMAL HIGH (ref 65–99)
Glucose, Bld: 156 mg/dL — ABNORMAL HIGH (ref 65–99)
Potassium: 4.4 mmol/L (ref 3.5–5.1)
Potassium: 4.1 mmol/L (ref 3.5–5.1)
SODIUM: 130 mmol/L — AB (ref 135–145)
Sodium: 132 mmol/L — ABNORMAL LOW (ref 135–145)

## 2014-07-30 LAB — CBC
HCT: 32.8 % — ABNORMAL LOW (ref 39.0–52.0)
HCT: 32.3 % — ABNORMAL LOW (ref 39.0–52.0)
HEMOGLOBIN: 11.4 g/dL — AB (ref 13.0–17.0)
Hemoglobin: 11.3 g/dL — ABNORMAL LOW (ref 13.0–17.0)
MCH: 32.1 pg (ref 26.0–34.0)
MCH: 32.3 pg (ref 26.0–34.0)
MCHC: 34.8 g/dL (ref 30.0–36.0)
MCHC: 35 g/dL (ref 30.0–36.0)
MCV: 92.4 fL (ref 78.0–100.0)
MCV: 92.3 fL (ref 78.0–100.0)
Platelets: 231 10*3/uL (ref 150–400)
Platelets: 233 10*3/uL (ref 150–400)
RBC: 3.55 MIL/uL — ABNORMAL LOW (ref 4.22–5.81)
RBC: 3.5 MIL/uL — ABNORMAL LOW (ref 4.22–5.81)
RDW: 15.6 % — ABNORMAL HIGH (ref 11.5–15.5)
RDW: 15.9 % — ABNORMAL HIGH (ref 11.5–15.5)
WBC: 10.9 10*3/uL — ABNORMAL HIGH (ref 4.0–10.5)
WBC: 14.7 10*3/uL — ABNORMAL HIGH (ref 4.0–10.5)

## 2014-07-30 LAB — HEPARIN LEVEL (UNFRACTIONATED): HEPARIN UNFRACTIONATED: 0.41 [IU]/mL (ref 0.30–0.70)

## 2014-07-30 LAB — TROPONIN I
TROPONIN I: 1.51 ng/mL — AB (ref <0.031)
Troponin I: 2.87 ng/mL (ref <0.031)
Troponin I: 3.67 ng/mL (ref <0.031)

## 2014-07-30 LAB — BRAIN NATRIURETIC PEPTIDE
B NATRIURETIC PEPTIDE 5: 660.4 pg/mL — AB (ref 0.0–100.0)
B Natriuretic Peptide: 1025.3 pg/mL — ABNORMAL HIGH (ref 0.0–100.0)

## 2014-07-30 MED ORDER — CLOPIDOGREL BISULFATE 75 MG PO TABS
75.0000 mg | ORAL_TABLET | Freq: Every day | ORAL | Status: DC
  Administered 2014-07-30 – 2014-08-02 (×4): 75 mg via ORAL
  Filled 2014-07-30 (×4): qty 1

## 2014-07-30 MED ORDER — BUDESONIDE-FORMOTEROL FUMARATE 160-4.5 MCG/ACT IN AERO
2.0000 | INHALATION_SPRAY | Freq: Two times a day (BID) | RESPIRATORY_TRACT | Status: DC
  Administered 2014-07-31 – 2014-08-02 (×5): 2 via RESPIRATORY_TRACT
  Filled 2014-07-30: qty 6

## 2014-07-30 MED ORDER — ASPIRIN EC 81 MG PO TBEC
81.0000 mg | DELAYED_RELEASE_TABLET | Freq: Every day | ORAL | Status: DC
  Administered 2014-07-30 – 2014-08-02 (×4): 81 mg via ORAL
  Filled 2014-07-30 (×4): qty 1

## 2014-07-30 MED ORDER — FUROSEMIDE 10 MG/ML IJ SOLN
20.0000 mg | Freq: Two times a day (BID) | INTRAMUSCULAR | Status: DC
  Administered 2014-07-30 – 2014-08-02 (×7): 20 mg via INTRAVENOUS
  Filled 2014-07-30 (×7): qty 2

## 2014-07-30 MED ORDER — ISOSORBIDE MONONITRATE ER 30 MG PO TB24
30.0000 mg | ORAL_TABLET | Freq: Every day | ORAL | Status: DC
  Administered 2014-07-30 – 2014-08-02 (×4): 30 mg via ORAL
  Filled 2014-07-30 (×4): qty 1

## 2014-07-30 MED ORDER — FUROSEMIDE 20 MG PO TABS: 10.0000 mg | ORAL_TABLET | Freq: Every day | ORAL | Status: DC

## 2014-07-30 MED ORDER — ALBUTEROL SULFATE (2.5 MG/3ML) 0.083% IN NEBU
5.0000 mg | INHALATION_SOLUTION | Freq: Once | RESPIRATORY_TRACT | Status: AC
  Administered 2014-07-30: 5 mg via RESPIRATORY_TRACT
  Filled 2014-07-30: qty 6

## 2014-07-30 MED ORDER — LISINOPRIL 10 MG PO TABS
10.0000 mg | ORAL_TABLET | Freq: Every day | ORAL | Status: DC
  Administered 2014-07-30 – 2014-08-02 (×4): 10 mg via ORAL
  Filled 2014-07-30 (×4): qty 1

## 2014-07-30 MED ORDER — CHOLECALCIFEROL 10 MCG (400 UNIT) PO TABS
400.0000 [IU] | ORAL_TABLET | Freq: Every day | ORAL | Status: DC
  Administered 2014-07-30 – 2014-08-02 (×4): 400 [IU] via ORAL
  Filled 2014-07-30 (×5): qty 1

## 2014-07-30 MED ORDER — HEPARIN BOLUS VIA INFUSION
3000.0000 [IU] | Freq: Once | INTRAVENOUS | Status: AC
  Administered 2014-07-30: 3000 [IU] via INTRAVENOUS
  Filled 2014-07-30: qty 3000

## 2014-07-30 MED ORDER — HYDROCHLOROTHIAZIDE 12.5 MG PO CAPS
12.5000 mg | ORAL_CAPSULE | Freq: Every day | ORAL | Status: DC
  Administered 2014-07-30 – 2014-08-02 (×4): 12.5 mg via ORAL
  Filled 2014-07-30 (×4): qty 1

## 2014-07-30 MED ORDER — MIRTAZAPINE 7.5 MG PO TABS
15.0000 mg | ORAL_TABLET | Freq: Every day | ORAL | Status: DC
  Administered 2014-07-30 – 2014-08-01 (×3): 15 mg via ORAL
  Filled 2014-07-30 (×3): qty 2

## 2014-07-30 MED ORDER — ALLOPURINOL 100 MG PO TABS
100.0000 mg | ORAL_TABLET | Freq: Every day | ORAL | Status: DC
  Administered 2014-07-30 – 2014-08-02 (×4): 100 mg via ORAL
  Filled 2014-07-30 (×4): qty 1

## 2014-07-30 MED ORDER — LISINOPRIL-HYDROCHLOROTHIAZIDE 20-25 MG PO TABS: 0.5000 | ORAL_TABLET | Freq: Every day | ORAL | Status: DC

## 2014-07-30 MED ORDER — PANTOPRAZOLE SODIUM 40 MG PO TBEC
40.0000 mg | DELAYED_RELEASE_TABLET | Freq: Every day | ORAL | Status: DC
  Administered 2014-07-30 – 2014-08-02 (×4): 40 mg via ORAL
  Filled 2014-07-30 (×4): qty 1

## 2014-07-30 MED ORDER — ASPIRIN 81 MG PO TABS: 81.0000 mg | ORAL_TABLET | Freq: Every day | ORAL | Status: DC

## 2014-07-30 MED ORDER — ATORVASTATIN CALCIUM 40 MG PO TABS
40.0000 mg | ORAL_TABLET | Freq: Every day | ORAL | Status: DC
  Administered 2014-07-30 – 2014-08-02 (×4): 40 mg via ORAL
  Filled 2014-07-30 (×4): qty 1

## 2014-07-30 MED ORDER — SODIUM CHLORIDE 0.9 % IV SOLN
INTRAVENOUS | Status: DC
  Administered 2014-07-30: 11:00:00 via INTRAVENOUS

## 2014-07-30 MED ORDER — VITAMIN C 500 MG PO TABS
250.0000 mg | ORAL_TABLET | Freq: Every day | ORAL | Status: DC
  Administered 2014-07-30 – 2014-08-02 (×4): 250 mg via ORAL
  Filled 2014-07-30 (×4): qty 1

## 2014-07-30 MED ORDER — METOPROLOL TARTRATE 12.5 MG HALF TABLET
12.5000 mg | ORAL_TABLET | Freq: Two times a day (BID) | ORAL | Status: DC
  Administered 2014-07-30 – 2014-08-02 (×6): 12.5 mg via ORAL
  Filled 2014-07-30 (×6): qty 1

## 2014-07-30 MED ORDER — HEPARIN (PORCINE) IN NACL 100-0.45 UNIT/ML-% IJ SOLN
900.0000 [IU]/h | INTRAMUSCULAR | Status: DC
  Administered 2014-07-30 – 2014-07-31 (×2): 800 [IU]/h via INTRAVENOUS
  Filled 2014-07-30 (×2): qty 250

## 2014-07-30 MED ORDER — ENSURE ENLIVE PO LIQD
237.0000 mL | Freq: Two times a day (BID) | ORAL | Status: DC
  Administered 2014-07-30 – 2014-08-02 (×5): 237 mL via ORAL

## 2014-07-30 MED ORDER — ALBUTEROL SULFATE (2.5 MG/3ML) 0.083% IN NEBU: 2.5000 mg | INHALATION_SOLUTION | RESPIRATORY_TRACT | Status: DC | PRN

## 2014-07-30 NOTE — Progress Notes (Addendum)
ANTICOAGULATION CONSULT NOTE - Initial Consult  Pharmacy Consult for heparin Indication: chest pain/ACS  Allergies  Allergen Reactions  . Beta Adrenergic Blockers     Too low  . Hydrocodone Rash    Patient Measurements: Height: 5\' 6"  (167.6 cm) Weight: 135 lb (61.236 kg) IBW/kg (Calculated) : 63.8 Heparin Dosing Weight: 61kg  Vital Signs: Temp: 97.9 F (36.6 C) (06/24 0840) Temp Source: Oral (06/24 0840) BP: 110/57 mmHg (06/24 1105) Pulse Rate: 91 (06/24 1105)  Labs:  Recent Labs  07/30/14 0915  HGB 11.4*  HCT 32.8*  PLT 231  CREATININE 1.89*  TROPONINI 1.51*    Estimated Creatinine Clearance: 24.3 mL/min (by C-G formula based on Cr of 1.89).   Medical History: Past Medical History  Diagnosis Date  . Hypertension   . Asthma   . Gout   . Hyperlipidemia   . Chronic kidney disease   . Arthritis   . Aortic stenosis   . CAD (coronary artery disease)   . Stroke     1965  . Inguinal hernia, right 05/15/2013  . COPD (chronic obstructive pulmonary disease) 10/11/2011  . Chronic kidney disease (CKD)   . Coronary artery disease involving native coronary artery   . Coronary artery disease involving coronary bypass graft 05/10/2014  . Zenker's diverticulum 05/21/2014  . Chronic combined systolic and diastolic congestive heart failure   . Mitral regurgitation   . 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 Anginal pain   . Shortness of breath dyspnea   . GERD (gastroesophageal reflux disease)   . Cancer     SKIN CANCER REMOVED L ELBOW  . 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 07/02/2014 TAVR: 23 mm Edwards Sapien 3 transcatheter heart valve placed via open right transfemoral approach;  b. 07/03/2014 Echo: EF 50-55%, no central or paravalvular leak, nl transaortic gradients, mod MR, mildly dil LA, mildly reduced RV fxn, PASP 46 mmHg.   Assessment: 60 YOM who  presents with difficulty breathing, elevated troponin. He had a recent aortic valve repair on ASA and Plavix PTA. He is having hemoptysis, but cardiology ok with starting heparin and monitoring. Currently chest pain free.  Hgb 11.4, plts 231.  Goal of Therapy:  Heparin level 0.3-0.7 units/ml Monitor platelets by anticoagulation protocol: Yes   Plan:  -heparin bolus with 3000 units IV x1, then start heparin infusion at 800 units/hr -heparin level in 8 hours due to age and renal function -daily HL and CBC -follow closely for worsening hemoptysis or other signs and symptoms of bleeding  Lauren D. Bajbus, PharmD, BCPS Clinical Pharmacist Pager: [phone] 07/30/2014 12:02 PM  Addendum:  HL level came back at 0.41 tonight.  Cont same rate F/u in AM  Onnie Boer, PharmD Pager: [phone] 07/30/2014 9:36 PM

## 2014-07-30 NOTE — ED Notes (Signed)
Pt presents to department for evaluation of midsternal chest pain and SOB, onset this morning. Denies chest pain upon arrival to ED. Also reports blood tinged sputum this morning. History of CHF, rales heard lower lungs. Respirations unlabored. Pt is alert and oriented x4.

## 2014-07-30 NOTE — ED Notes (Signed)
Skin tear to L upper arm from [facility] electrode. Bacitracin ointment and bandage applied.

## 2014-07-30 NOTE — ED Provider Notes (Signed)
CSN: [id number]     Arrival date & time 07/30/14  5374 History   First MD Initiated Contact with Patient 07/30/14 [phone]     Chief Complaint  Patient presents with  . Chest Pain  . Shortness of Breath     (Consider location/radiation/quality/duration/timing/severity/associated sxs/prior Treatment) HPI Patient presents with concern of dyspnea, hemoptysis. Symptoms began about 2 hours prior to ED arrival. Patient was well prior to that. He acknowledges a history of COPD, recent aortic valve repair. Yesterday the patient also had elective cesarean injection to his left knee, which is chronically inflamed. Currently the patient denies pain anywhere, lightheadedness, confusion, disorientation, fever, chills. Since onset no clear alleviating or exacerbating factors. No recent weight change, new swelling anywhere.  Past Medical History  Diagnosis Date  . Hypertension   . Asthma   . Gout   . Hyperlipidemia   . Chronic kidney disease   . Arthritis   . Aortic stenosis   . CAD (coronary artery disease)   . Stroke     1965  . Inguinal hernia, right 05/15/2013  . COPD (chronic obstructive pulmonary disease) 10/11/2011  . Chronic kidney disease (CKD)   . Coronary artery disease involving native coronary artery   . Coronary artery disease involving coronary bypass graft 05/10/2014  . Zenker's diverticulum 05/21/2014  . Chronic combined systolic and diastolic congestive heart failure   . Mitral regurgitation   . 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 Anginal pain   . Shortness of breath dyspnea   . GERD (gastroesophageal reflux disease)   . Cancer     SKIN CANCER REMOVED L ELBOW  . 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 07/02/2014 TAVR: 23 mm Edwards Sapien 3 transcatheter heart valve placed via open right transfemoral approach;  b. 07/03/2014 Echo: EF 50-55%, no central or  paravalvular leak, nl transaortic gradients, mod MR, mildly dil LA, mildly reduced RV fxn, PASP 46 mmHg.   Past Surgical History  Procedure Laterality Date  . Left and right heart catheterization with coronary/graft angiogram N/A 05/10/2014    Procedure: LEFT AND RIGHT HEART CATHETERIZATION WITH Beatrix Fetters;  Surgeon: Sherren Mocha, MD;  Location: [facility] CATH LAB;  Service: Cardiovascular;  Laterality: N/A;  . Cardiac catheterization    . Coronary artery bypass graft  01/06/1997    LIMA to LAD, SVG to OM, open SVG harvest right lower leg - [facility]  . Tee without cardioversion N/A 05/17/2014    Procedure: TRANSESOPHAGEAL ECHOCARDIOGRAM (TEE);  Surgeon: Lelon Perla, MD;  Location: [facility] ENDOSCOPY;  Service: Cardiovascular;  Laterality: N/A;  . Transcatheter aortic valve replacement, transfemoral N/A 07/02/2014    Procedure: TRANSCATHETER AORTIC VALVE REPLACEMENT, TRANSFEMORAL;  Surgeon: Sherren Mocha, MD;  Location: [place];  Service: Open Heart Surgery;  Laterality: N/A;  . Tee without cardioversion N/A 07/02/2014    Procedure: TRANSESOPHAGEAL ECHOCARDIOGRAM (TEE);  Surgeon: Sherren Mocha, MD;  Location: [place];  Service: Open Heart Surgery;  Laterality: N/A;   Family History  Problem Relation Age of Onset  . Heart attack Mother   . Heart attack Brother    History  Substance Use Topics  . Smoking status: Former Smoker    Quit date: 02/06/1963  . Smokeless tobacco: Not on file  . Alcohol Use: 0.0 oz/week    0 Standard drinks or equivalent per week  Review of Systems  Constitutional:       Per HPI, otherwise negative  HENT:       Per HPI, otherwise negative  Respiratory:       Per HPI, otherwise negative  Cardiovascular:       Per HPI, otherwise negative  Gastrointestinal: Negative for vomiting.  Endocrine:       Negative aside from HPI  Genitourinary:       Neg aside from HPI   Musculoskeletal:       Per HPI, otherwise negative  Skin: Negative.     Neurological: Negative for syncope.      Allergies  Beta adrenergic blockers and Hydrocodone  Home Medications   Prior to Admission medications   Medication Sig Start Date End Date Taking? Authorizing Provider  albuterol (PROVENTIL HFA;VENTOLIN HFA) 108 (90 BASE) MCG/ACT inhaler Inhale 1 puff into the lungs every 4 (four) hours as needed for shortness of breath.    Yes Historical Provider, MD  allopurinol (ZYLOPRIM) 100 MG tablet Take 1 tablet (100 mg total) by mouth daily. 02/08/14  Yes Silverio Decamp, MD  Ascorbic Acid (VITAMIN C PO) Take 1 tablet by mouth daily.   Yes Historical Provider, MD  aspirin 81 MG tablet Take 81 mg by mouth at bedtime.    Yes Historical Provider, MD  atorvastatin (LIPITOR) 40 MG tablet Take 1 tablet (40 mg total) by mouth daily. 04/29/14 04/29/15 Yes Silverio Decamp, MD  budesonide-formoterol [facility]) 160-4.5 MCG/ACT inhaler Inhale 2 puffs into the lungs 2 (two) times daily. 02/01/14  Yes Silverio Decamp, MD  Cholecalciferol (VITAMIN D PO) Take 1 tablet by mouth daily.   Yes Historical Provider, MD  clopidogrel (PLAVIX) 75 MG tablet Take 1 tablet (75 mg total) by mouth daily with breakfast. 07/06/14  Yes Rogelia Mire, NP  fluticasone (FLONASE) 50 MCG/ACT nasal spray One spray in each nostril twice a day, use left hand for right nostril, and right hand for left nostril. Patient taking differently: Place 1 spray into both nostrils daily. One spray in each nostril twice a day, use left hand for right nostril, and right hand for left nostril. 01/25/14  Yes Silverio Decamp, MD  furosemide (LASIX) 20 MG tablet Take 0.5 tablets (10 mg total) by mouth daily. 07/29/14  Yes Silverio Decamp, MD  isosorbide mononitrate (IMDUR) 30 MG 24 hr tablet Take 1 tablet (30 mg total) by mouth daily. 07/06/14  Yes Rogelia Mire, NP  lisinopril-hydrochlorothiazide (PRINZIDE,ZESTORETIC) 20-25 MG per tablet Take 0.5 tablets by mouth daily. 07/21/14  10/12/14 Yes Silverio Decamp, MD  metoprolol tartrate (LOPRESSOR) 25 MG tablet Take 0.5 tablets (12.5 mg total) by mouth 2 (two) times daily. 07/29/14  Yes Silverio Decamp, MD  mirtazapine (REMERON) 15 MG tablet Take 1 tablet (15 mg total) by mouth at bedtime. 07/29/14  Yes Silverio Decamp, MD  oxyCODONE-acetaminophen (PERCOCET/ROXICET) 5-325 MG per tablet Take 0.5 tablets by mouth every 8 (eight) hours as needed. 07/07/14  Yes Silverio Decamp, MD  pantoprazole (PROTONIX) 40 MG tablet Take 1 tablet (40 mg total) by mouth daily. 07/06/14  Yes Rogelia Mire, NP  Tiotropium Bromide Monohydrate (SPIRIVA RESPIMAT) 2.5 MCG/ACT AERS Inhale 2.5 mg into the lungs every morning.   Yes Historical Provider, MD   BP 153/93 mmHg  Pulse 92  Temp(Src) 97.9 F (36.6 C) (Oral)  Resp 19  Ht 5\' 6"  (1.676 m)  Wt 135 lb (61.236 kg)  BMI 21.80 kg/m2  SpO2 96% Physical Exam  Constitutional: He is oriented to person, place, and time. He appears well-developed. No distress.  HENT:  Head: Normocephalic and atraumatic.  Eyes: Conjunctivae and EOM are normal.  Cardiovascular: Normal rate and regular rhythm.   Pulmonary/Chest: No stridor. He has decreased breath sounds. He has wheezes.  Abdominal: He exhibits no distension.  Musculoskeletal: He exhibits no edema.       Legs: Neurological: He is alert and oriented to person, place, and time.  Skin: Skin is warm and dry.  Psychiatric: He has a normal mood and affect.  Nursing note and vitals reviewed.   ED Course  Procedures (including critical care time) Labs Review Labs Reviewed  BASIC METABOLIC PANEL - Abnormal; Notable for the following:    Sodium 130 (*)    Chloride 98 (*)    CO2 20 (*)    Glucose, Bld 154 (*)    BUN 42 (*)    Creatinine, Ser 1.89 (*)    GFR calc non Af Amer 31 (*)    GFR calc Af Amer 35 (*)    All other components within normal limits  CBC - Abnormal; Notable for the following:    WBC 10.9 (*)    RBC 3.55  (*)    Hemoglobin 11.4 (*)    HCT 32.8 (*)    RDW 15.6 (*)    All other components within normal limits  TROPONIN I - Abnormal; Notable for the following:    Troponin I 1.51 (*)    All other components within normal limits  BRAIN NATRIURETIC PEPTIDE - Abnormal; Notable for the following:    B Natriuretic Peptide 660.4 (*)    All other components within normal limits    Imaging Review Dg Chest 2 View  07/30/2014   CLINICAL DATA:  Shortness of breath. History of coronary artery disease.  EXAM: CHEST  2 VIEW  COMPARISON:  07/06/2014  FINDINGS: Patient had a transcatheter aortic valve replacement. There are median sternotomy wires. Heart size is normal. Patient has chronic interstitial lung prominence but there appears to be increased interstitial markings on this examination. No evidence for pleural effusions. Degenerative changes in the thoracic spine.  IMPRESSION: Increased interstitial lung densities are concerning for interstitial pulmonary edema. No focal airspace disease.   Electronically Signed   By: Markus Daft M.D.   On: 07/30/2014 10:05     EKG Interpretation   Date/Time:  Friday July 30 2014 08:42:13 EDT Ventricular Rate:  82 PR Interval:  220 QRS Duration: 106 QT Interval:  399 QTC Calculation: 466 R Axis:   -23 Text Interpretation:  Sinus rhythm Prolonged PR interval Borderline left  axis deviation Probable anteroseptal infarct, old Nonspecific T  abnormalities, lateral leads Sinus rhythm Artifact Left axis deviation  Non-specific intra-ventricular conduction delay Abnormal ekg Confirmed by  Carmin Muskrat  MD ([id number]) on 07/30/2014 8:52:28 AM     On repeat exam the patient appears slightly better after breathing treatment. Patient's labs notable for elevated troponin, BNP.  Patient also has elevation in creatinine.   I discussed this case with our cardiology team.  ECHO 5/28 Study Conclusions  - Left ventricle: The cavity size was normal. Systolic function was    normal. The estimated ejection fraction was in the range of 50%   to 55%. Wall motion was normal; there were no regional wall   motion abnormalities. The study is not technically sufficient to   allow evaluation of LV diastolic function. - Aortic valve: 23  mm Edward-SAPIEN valve sits well in the aortic   position. There is no central or paravalvular leak. Transaortic   gradients are normal. - Mitral valve: There was moderate regurgitation directed   centrally. - Left atrium: The atrium was mildly dilated. - Right ventricle: Systolic function was mildly reduced. - Right atrium: The atrium was normal in size. - Pulmonary arteries: Systolic pressure was mildly to moderately   increased. PA peak pressure: 46 mm Hg (S). - Inferior vena cava: The vessel was normal in size. - Pericardium, extracardiac: There was no pericardial effusion.   Heparin started  MDM  Patient presents with new difficulty breathing, mild hemoptysis.  Here patient is awake, alert, hemodynamically stable aside for mild hypoxia. Patient's breathing improved following initial breathing treatment, supplemental oxygen. Patient had no chest pain throughout, but initial labs were notable for elevated troponin, BNP, creatinine. Patient has recent aortic valve repair, is on Plavix, and has history of coronary disease as well. Patient was started on heparin, after discussion with our cardiology colleagues   CRITICAL CARE Performed by: Carmin Muskrat Total critical care time: 45 Critical care time was exclusive of separately billable procedures and treating other patients. Critical care was necessary to treat or prevent imminent or life-threatening deterioration. Critical care was time spent personally by me on the following activities: development of treatment plan with patient and/or surrogate as well as nursing, discussions with consultants, evaluation of patient's response to treatment, examination of patient, obtaining  history from patient or surrogate, ordering and performing treatments and interventions, ordering and review of laboratory studies, ordering and review of radiographic studies, pulse oximetry and re-evaluation of patient's condition.      Carmin Muskrat, MD 07/30/14 [phone]

## 2014-07-30 NOTE — H&P (Addendum)
Patient ID: Eric Buchanan MRN: [id number], DOB/AGE: March 03, 1925   Admit date: 07/30/2014   Primary Physician: Aundria Mems, MD Primary Cardiologist:   Pt. Profile: CP, SOB, hemoptysis  Problem List  Past Medical History  Diagnosis Date  . Hypertension   . Asthma   . Gout   . Hyperlipidemia   . Chronic kidney disease   . Arthritis   . Aortic stenosis   . CAD (coronary artery disease)   . Stroke     1965  . Inguinal hernia, right 05/15/2013  . COPD (chronic obstructive pulmonary disease) 10/11/2011  . Chronic kidney disease (CKD)   . Coronary artery disease involving native coronary artery   . Coronary artery disease involving coronary bypass graft 05/10/2014  . Zenker's diverticulum 05/21/2014  . Chronic combined systolic and diastolic congestive heart failure   . Mitral regurgitation   . S/P CABG x 2 01/06/1997    LIMA to LAD, SVG to OM, open vein harvest right lower leg - [facility]  . Anginal pain   . Shortness of breath dyspnea   . GERD (gastroesophageal reflux disease)   . Cancer     SKIN CANCER REMOVED L ELBOW  . 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 07/02/2014 TAVR: 23 mm Edwards Sapien 3 transcatheter heart valve placed via open right transfemoral approach;  b. 07/03/2014 Echo: EF 50-55%, no central or paravalvular leak, nl transaortic gradients, mod MR, mildly dil LA, mildly reduced RV fxn, PASP 46 mmHg.    Past Surgical History  Procedure Laterality Date  . Left and right heart catheterization with coronary/graft angiogram N/A 05/10/2014    Procedure: LEFT AND RIGHT HEART CATHETERIZATION WITH Beatrix Fetters;  Surgeon: Sherren Mocha, MD;  Location: [facility] CATH LAB;  Service: Cardiovascular;  Laterality: N/A;  . Cardiac catheterization    . Coronary artery bypass graft  01/06/1997    LIMA to LAD, SVG to OM, open SVG harvest right lower leg - [facility]  . Tee without cardioversion N/A  05/17/2014    Procedure: TRANSESOPHAGEAL ECHOCARDIOGRAM (TEE);  Surgeon: Lelon Perla, MD;  Location: [facility] ENDOSCOPY;  Service: Cardiovascular;  Laterality: N/A;  . Transcatheter aortic valve replacement, transfemoral N/A 07/02/2014    Procedure: TRANSCATHETER AORTIC VALVE REPLACEMENT, TRANSFEMORAL;  Surgeon: Sherren Mocha, MD;  Location: [place];  Service: Open Heart Surgery;  Laterality: N/A;  . Tee without cardioversion N/A 07/02/2014    Procedure: TRANSESOPHAGEAL ECHOCARDIOGRAM (TEE);  Surgeon: Sherren Mocha, MD;  Location: [place];  Service: Open Heart Surgery;  Laterality: N/A;    Allergies  Allergies  Allergen Reactions  . Beta Adrenergic Blockers     Too low  . Hydrocodone Rash   HPI  Patient is a 81 y.o. male with a PMHx of severe AS, s/p TAVR on 07/02/2014 with good results (post TAVR TTE showed normal transaortic gradients, no AI, normal LVEF, moderate MR, mild to moderate pulmonary hypertension on 07/03/14), and excellent improvement in his symptoms. The patient was seen by Dr Stanford Breed on 07/16/2014 and was doing very well.  The patient was doing well until yesterday when he received a steroid shot into his knee. He woke up this morning with sudden onset SOB, chest pain and hemoptysis - 3 x with mild blood tinged sputum size of dime each time. He came to the ER, he still feels SOB, chest pain has resolved, he has also noticed LE edema, no palpitations, no fever, diaphoresis.  He was found  to have mild interstitial edema on CXR and elevated troponin.   He had Cardiac catheterization in April 2016 that showed severe three-vessel coronary artery disease. The LIMA to the LAD was patent. Saphenous vein graft to the second marginal was also patent but there was note of a 75% stenosis in the mid graft. There was moderate pulmonary hypertension. CTA May 2016 showed tiny pulmonary nodules and follow-up recommended in 6-12 months. There is note of mild fusiform dilatation of the abdominal aorta  measuring 2.5 x 2.4 cm.     Home Medications  Prior to Admission medications   Medication Sig Start Date End Date Taking? Authorizing Provider  albuterol (PROVENTIL HFA;VENTOLIN HFA) 108 (90 BASE) MCG/ACT inhaler Inhale 1 puff into the lungs every 4 (four) hours as needed for shortness of breath.    Yes Historical Provider, MD  allopurinol (ZYLOPRIM) 100 MG tablet Take 1 tablet (100 mg total) by mouth daily. 02/08/14  Yes Silverio Decamp, MD  Ascorbic Acid (VITAMIN C PO) Take 1 tablet by mouth daily.   Yes Historical Provider, MD  aspirin 81 MG tablet Take 81 mg by mouth at bedtime.    Yes Historical Provider, MD  atorvastatin (LIPITOR) 40 MG tablet Take 1 tablet (40 mg total) by mouth daily. 04/29/14 04/29/15 Yes Silverio Decamp, MD  budesonide-formoterol [facility]) 160-4.5 MCG/ACT inhaler Inhale 2 puffs into the lungs 2 (two) times daily. 02/01/14  Yes Silverio Decamp, MD  Cholecalciferol (VITAMIN D PO) Take 1 tablet by mouth daily.   Yes Historical Provider, MD  clopidogrel (PLAVIX) 75 MG tablet Take 1 tablet (75 mg total) by mouth daily with breakfast. 07/06/14  Yes Rogelia Mire, NP  fluticasone (FLONASE) 50 MCG/ACT nasal spray One spray in each nostril twice a day, use left hand for right nostril, and right hand for left nostril. Patient taking differently: Place 1 spray into both nostrils daily. One spray in each nostril twice a day, use left hand for right nostril, and right hand for left nostril. 01/25/14  Yes Silverio Decamp, MD  furosemide (LASIX) 20 MG tablet Take 0.5 tablets (10 mg total) by mouth daily. 07/29/14  Yes Silverio Decamp, MD  isosorbide mononitrate (IMDUR) 30 MG 24 hr tablet Take 1 tablet (30 mg total) by mouth daily. 07/06/14  Yes Rogelia Mire, NP  lisinopril-hydrochlorothiazide (PRINZIDE,ZESTORETIC) 20-25 MG per tablet Take 0.5 tablets by mouth daily. 07/21/14 10/12/14 Yes Silverio Decamp, MD  metoprolol tartrate (LOPRESSOR) 25  MG tablet Take 0.5 tablets (12.5 mg total) by mouth 2 (two) times daily. 07/29/14  Yes Silverio Decamp, MD  mirtazapine (REMERON) 15 MG tablet Take 1 tablet (15 mg total) by mouth at bedtime. 07/29/14  Yes Silverio Decamp, MD  oxyCODONE-acetaminophen (PERCOCET/ROXICET) 5-325 MG per tablet Take 0.5 tablets by mouth every 8 (eight) hours as needed. 07/07/14  Yes Silverio Decamp, MD  pantoprazole (PROTONIX) 40 MG tablet Take 1 tablet (40 mg total) by mouth daily. 07/06/14  Yes Rogelia Mire, NP  Tiotropium Bromide Monohydrate (SPIRIVA RESPIMAT) 2.5 MCG/ACT AERS Inhale 2.5 mg into the lungs every morning.   Yes Historical Provider, MD   Family History  Family History  Problem Relation Age of Onset  . Heart attack Mother   . Heart attack Brother    Social History  History   Social History  . Marital Status: Widowed    Spouse Name: N/A  . Number of Children: 3  . Years of Education: N/A  Occupational History  . Not on file.   Social History Main Topics  . Smoking status: Former Smoker    Quit date: 02/06/1963  . Smokeless tobacco: Not on file  . Alcohol Use: 0.0 oz/week    0 Standard drinks or equivalent per week  . Drug Use: No  . Sexual Activity: Not Currently   Other Topics Concern  . Not on file   Social History Narrative    Review of Systems General:  No chills, fever, night sweats or weight changes.  Cardiovascular:  No chest pain, dyspnea on exertion, edema, orthopnea, palpitations, paroxysmal nocturnal dyspnea. Dermatological: No rash, lesions/masses Respiratory: No cough, dyspnea Urologic: No hematuria, dysuria Abdominal:   No nausea, vomiting, diarrhea, bright red blood per rectum, melena, or hematemesis Neurologic:  No visual changes, wkns, changes in mental status. All other systems reviewed and are otherwise negative except as noted above.  Physical Exam  Blood pressure 153/93, pulse 92, temperature 97.9 F (36.6 C), temperature source  Oral, resp. rate 19, height 5\' 6"  (1.676 m), weight 135 lb (61.236 kg), SpO2 96 %.  General: Pleasant, NAD Psych: Normal affect. Neuro: Alert and oriented X 3. Moves all extremities spontaneously. HEENT: Normal, face appears mildly puffy  Neck: Supple without bruits or JVD. Lungs:  Resp regular and unlabored, crackles at bases. Heart: RRR no s3, s4, or murmurs. Abdomen: Soft, non-tender, non-distended, BS + x 4.  Extremities: No clubbing, cyanosis, B/L LE edema +1 up to the knees. DP/PT/Radials 2+ and equal bilaterally.  Labs  Recent Labs  07/30/14 0915  TROPONINI 1.51*   Lab Results  Component Value Date   WBC 10.9* 07/30/2014   HGB 11.4* 07/30/2014   HCT 32.8* 07/30/2014   MCV 92.4 07/30/2014   PLT 231 07/30/2014    Recent Labs Lab 07/30/14 0915  NA 130*  K 4.4  CL 98*  CO2 20*  BUN 42*  CREATININE 1.89*  CALCIUM 9.4  GLUCOSE 154*   Lab Results  Component Value Date   CHOL 128 05/07/2014   HDL 60 05/07/2014   LDLCALC 52 05/07/2014   TRIG 80 05/07/2014   Radiology/Studies  Dg Chest 2 View  07/30/2014   CLINICAL DATA:  Shortness of breath. History of coronary artery disease.  EXAM: CHEST  2 VIEW  COMPARISON:  07/06/2014  FINDINGS: Patient had a transcatheter aortic valve replacement. There are median sternotomy wires. Heart size is normal. Patient has chronic interstitial lung prominence but there appears to be increased interstitial markings on this examination. No evidence for pleural effusions. Degenerative changes in the thoracic spine.  IMPRESSION: Increased interstitial lung densities are concerning for interstitial pulmonary edema. No focal airspace disease.   Electronically Signed   By: Markus Daft M.D.   On: 07/30/2014 10:05   Echocardiogram: 07/03/2014 Left ventricle: The cavity size was normal. Systolic function was normal. The estimated ejection fraction was in the range of 50% to 55%. Wall motion was normal; there were no regional wall motion  abnormalities. The study is not technically sufficient to allow evaluation of LV diastolic function. - Aortic valve: 23 mm Edward-SAPIEN valve sits well in the aortic position. There is no central or paravalvular leak. Transaortic gradients are normal. - Mitral valve: There was moderate regurgitation directed centrally. - Left atrium: The atrium was mildly dilated. - Right ventricle: Systolic function was mildly reduced. - Right atrium: The atrium was normal in size. - Pulmonary arteries: Systolic pressure was mildly to moderately increased. PA peak pressure: 46 mm  Hg (S). - Inferior vena cava: The vessel was normal in size. - Pericardium, extracardiac: There was no pericardial effusion.  ECG: SR, 1.AVB, LAD, negative T wave in the lateral leads, unchanged from prior   Cardiac cath: 05/10/2014 Left anterior descending (LAD): The LAD is severely calcified. The vessel is totally occluded proximally. Left circumflex (LCx): The left circumflex is severely diseased. There is 95% ostial stenosis. The obtuse marginal branches are occluded. The distal posterolateral branches are small, but patent. There is diffuse 80-90% mid left circumflex stenosis. Right coronary artery (RCA): The RCA is dominant. The vessel is diffusely calcified. The mid RCA has 80% stenosis. The PLA branch is patent. The PDA branch has area severe diffuse disease with multiple sequential 90-95% stenoses. The vessel is small beyond the area of severe stenosis.  Saphenous vein graft to OM 2: The graft is patent. The mid body of the graft as eccentric 75% stenosis. Beyond the graft insertion site, the native OM 2 is severely diseased with 95% stenosis into a small subbranch. The first OM is also small and it fills retrograde from the graft insertion site.  LIMA to LAD: The LIMA is a large conduit. The vessel is widely patent. The anastomotic site is intact. The LAD is patent and there is 80% stenosis in the apical portion  of the LAD. The diagonal and septal perforator branches all fill from the graft.  Final Conclusions:  1. Severe three-vessel coronary artery disease 2. Status post aortocoronary bypass surgery with continued patency of the LIMA to LAD and saphenous vein graft to OM 2 3. Severe aortic stenosis 4. Known moderate to severe mitral regurgitation based on echo findings 5. Moderate point hypertension, likely related to left heart disease    ASSESSMENT AND PLAN  81 year old male with known CAD, post recent TAVR admitted with acute onset DO  1. Elevated troponin - most probably combination of acute CHF and acute on CKD, however he has known 75% stenosis in the SVG to OM2, we will treat as NSTEMI, start Heparin drip with close monitoring of hemoptysis. If Crea improves and he continues having chest pain we will consider a cath, he is currently chest pain free. We will continue cycling troponin and ECG (currently unchanged from prior). Continue ASA, Plavix, atorvastatin, metoprolol.  2. Acute on chronic diastolic CHF - he was only on 10 mg po lasix daily at home, his weight is at his baseline at 135 lbs, appears fluid overloaded with crackles and LE edema, we will start lasix 20 mg iv BID and monitor Crea closely. This could have been precipitated by steroid injection given yesterday.  3. Hemoptysis - possibly sec to acute CHF, he also had tiny pulmonary nodules on CTA in May 2016 and follow-up CTA was recommended in 6-12 months. We will not repeat right now unless his hemoptysis worsens considering his acute on CKD.  4. Acute on CKD, baseline Crea 1.18, today 1.89, we will monitor closely. Hold lisinopril  5. S/P TAVR - well functioning valve.  6. HTN - restart home meds   DVT PPX - Heparin drip   Signed, Dorothy Spark, MD, [facility] 07/30/2014, 10:35 AM

## 2014-07-30 NOTE — Progress Notes (Signed)
CRITICAL VALUE ALERT  Critical value received:  16:13  Date of notification:  07/30/14  Time of notification:  16:13  Critical value read back:No.  Nurse who received alert:  Aleda Grana  MD notified (1st page):  Dr. Meda Coffee   Time of first page:  4:16  MD notified (2nd page):  Time of second page:  Responding MD: Dr. Meda Coffee  Time MD responded:  4:20  Pt asymtomatic will continue to monitor pt

## 2014-07-30 NOTE — ED Notes (Signed)
Pt resting quietly at the time. Vital signs stable. Denies chest pain. Family at bedside.

## 2014-07-31 ENCOUNTER — Inpatient Hospital Stay (HOSPITAL_COMMUNITY): Payer: Medicare Other

## 2014-07-31 DIAGNOSIS — N179 Acute kidney failure, unspecified: Secondary | ICD-10-CM | POA: Insufficient documentation

## 2014-07-31 DIAGNOSIS — I5033 Acute on chronic diastolic (congestive) heart failure: Secondary | ICD-10-CM | POA: Insufficient documentation

## 2014-07-31 LAB — CBC
HCT: 28.7 % — ABNORMAL LOW (ref 39.0–52.0)
HEMOGLOBIN: 9.9 g/dL — AB (ref 13.0–17.0)
MCH: 32.9 pg (ref 26.0–34.0)
MCHC: 34.5 g/dL (ref 30.0–36.0)
MCV: 95.3 fL (ref 78.0–100.0)
Platelets: 223 10*3/uL (ref 150–400)
RBC: 3.01 MIL/uL — AB (ref 4.22–5.81)
RDW: 16.3 % — AB (ref 11.5–15.5)
WBC: 8.3 10*3/uL (ref 4.0–10.5)

## 2014-07-31 LAB — HEPARIN LEVEL (UNFRACTIONATED): Heparin Unfractionated: 0.35 IU/mL (ref 0.30–0.70)

## 2014-07-31 LAB — TROPONIN I: Troponin I: 3.83 ng/mL (ref <0.031)

## 2014-07-31 NOTE — Progress Notes (Signed)
Initial Nutrition Assessment  DOCUMENTATION CODES: Non-severe (moderate) malnutrition in context of chronic illness  INTERVENTION: Ensure Enlive (each supplement provides 350kcal and 20 grams of protein)   Recommend MVI with minerals (doesnt take one at home)  NUTRITION DIAGNOSIS: Inadequate oral intake related to a poor appetite as evidenced by loss of just under 5 % bw in 3 weeks  GOAL: Patient will meet greater than or equal to 90% of their needs  MONITOR: PO intake, Supplement acceptance, Labs, I & O's, Skin  REASON FOR ASSESSMENT: Malnutrition Screening Tool    ASSESSMENT: 81 y/o male PMHx of HTN, Gout, CKD, HLD, CAD, COPD, CKD, CHF, and severe AS. Presents with sudden onset SOB, chest pain and hemoptysis.    Pt states that his appetite "has been a toss up" recently. He says he just doesn't eat a lot. He stated he doesn't like eating more then he has to. He followed no specific diet at home and took Vit D/C.  Threw up once and he said "it cleared him out". No n/v since. He has a BM every 3 days, but states he isnt constipated.   Reports his normal weight is 143-145 lbs.He doesn't think he has lost the 7 lbs that records indicate. He says when he gets weighed at his doctors office he has heavier clothes on and has change in his pockets.    He said he is eating well now and ate most of his last meal. He does not like Boost, but was agreeable to try the Ensure. Told him to ask Nurse for pudding if he doesn't like the Ensure  Height: Ht Readings from Last 1 Encounters:  07/30/14 5\' 5"  (1.651 m)    Weight: Wt Readings from Last 1 Encounters:  07/31/14 161 lb 4.4 oz (73.155 kg)    Ideal Body Weight:  61.8 kg  Wt Readings from Last 10 Encounters:  07/31/14 161 lb 4.4 oz (73.155 kg)  07/29/14 136 lb (61.689 kg)  07/07/14 143 lb 1.9 oz (64.919 kg)  07/06/14 142 lb 9.6 oz (64.683 kg)  07/01/14 142 lb (64.411 kg)  06/30/14 141 lb (63.957 kg)  06/30/14 141 lb 12.8 oz  (64.32 kg)  06/21/14 141 lb (63.957 kg)  06/17/14 141 lb (63.957 kg)  06/10/14 145 lb (65.772 kg)  Down 7#s in 3 weeks Dosing: 136 lbs (61.7 kg)  BMI:  Body mass index is 26.84 kg/(m^2).  Estimated Nutritional Needs: Kcal:  1600-1750 (26-28 kcal/kg) Protein:  68-81 g (1.1-1.3 g/kg) Fluid:  1.8 liters  Skin:  Ecchymosis + Skin tears  Diet Order:  Diet Heart Room service appropriate?: Yes; Fluid consistency:: Thin  EDUCATION NEEDS: No education needs identified at this time   Intake/Output Summary (Last 24 hours) at 07/31/14 1146 Last data filed at 07/31/14 0100  Gross per 24 hour  Intake    720 ml  Output   1050 ml  Net   -330 ml    Last BM: 6/22  Burtis Junes RD, LDN Nutrition Pager: [phone] 07/31/2014 11:46 AM

## 2014-07-31 NOTE — Progress Notes (Signed)
Utilization review completed.  

## 2014-07-31 NOTE — Progress Notes (Signed)
Patient Name: Eric Buchanan Date of Encounter: 07/31/2014   SUBJECTIVE  Denies chest pain, palpitation or sob.  No further hemoptysis.  Wants to go home.  Has "appointments" that he needs to get to...  CURRENT MEDS . allopurinol  100 mg Oral Daily  . aspirin EC  81 mg Oral Daily  . atorvastatin  40 mg Oral Daily  . budesonide-formoterol  2 puff Inhalation BID  . cholecalciferol  400 Units Oral Daily  . clopidogrel  75 mg Oral Q breakfast  . feeding supplement (ENSURE ENLIVE)  237 mL Oral BID BM  . furosemide  20 mg Intravenous BID  . lisinopril  10 mg Oral Daily   And  . hydrochlorothiazide  12.5 mg Oral Daily  . isosorbide mononitrate  30 mg Oral Daily  . metoprolol tartrate  12.5 mg Oral BID  . mirtazapine  15 mg Oral QHS  . pantoprazole  40 mg Oral Daily  . vitamin C  250 mg Oral Daily    OBJECTIVE  Filed Vitals:   07/30/14 1317 07/30/14 1329 07/30/14 2036 07/31/14 0404  BP: 127/56  107/52 110/40  Pulse: 85  84 59  Temp: 98.6 F (37 C)  98.6 F (37 C) 98 F (36.7 C)  TempSrc:   Oral Oral  Resp: 22  25 19   Height:  5\' 5"  (1.651 m)    Weight:  162 lb (73.483 kg)  161 lb 4.4 oz (73.155 kg)  SpO2: 98%  97% 97%    Intake/Output Summary (Last 24 hours) at 07/31/14 0959 Last data filed at 07/31/14 0100  Gross per 24 hour  Intake    720 ml  Output   1050 ml  Net   -330 ml   Filed Weights   07/30/14 0835 07/30/14 1329 07/31/14 0404  Weight: 135 lb (61.236 kg) 162 lb (73.483 kg) 161 lb 4.4 oz (73.155 kg)    PHYSICAL EXAM  General: Pleasant, NAD. Neuro: Alert and oriented X 3. Moves all extremities spontaneously. Psych: Normal affect. HEENT:  Normal  Neck: Supple without bruits or JVD. Lungs:  Resp regular and unlabored, bibasilar crackles.  Heart: RRR no s3, s4, or murmurs. Abdomen: Soft, non-tender, non-distended, BS + x 4.  Extremities: No clubbing, cyanosis. BL LE -  1+  edema. DP/PT/Radials 2+ and equal bilaterally.  Accessory Clinical  Findings  CBC  Recent Labs  07/30/14 1500 07/31/14 0551  WBC 14.7* 8.3  HGB 11.3* 9.9*  HCT 32.3* 28.7*  MCV 92.3 95.3  PLT 233 027   Basic Metabolic Panel  Recent Labs  07/30/14 0915 07/30/14 1500  NA 130* 132*  K 4.4 4.1  CL 98* 99*  CO2 20* 19*  GLUCOSE 154* 156*  BUN 42* 40*  CREATININE 1.89* 1.79*  CALCIUM 9.4 9.2   Cardiac Enzymes  Recent Labs  07/30/14 1245 07/30/14 1724 07/31/14 0009  TROPONINI 2.87* 3.67* 3.83*    TELE  NSR. Frequent PVCs and occasional irregular HR.  Radiology/Studies  Dg Chest 2 View  07/31/2014   CLINICAL DATA:  Congestive heart failure and hemoptysis, history of aortic valve replacement  EXAM: CHEST - 2 VIEW  COMPARISON:  07/30/2014  FINDINGS: Cardiac shadow is stable. Changes consistent with prior transcatheter aortic valve replacement are noted. Coronary bypass grafting changes are noted as well. Mild vascular congestion is noted superimposed over diffuse interstitial changes likely of a chronic nature. No focal confluent infiltrate is seen. No sizable effusion is noted.  IMPRESSION: Postoperative changes.  Mild vascular  congestion superimposed over chronic interstitial changes.   Electronically Signed   By: Inez Catalina M.D.   On: 07/31/2014 08:30   Dg Chest 2 View  07/30/2014   CLINICAL DATA:  Shortness of breath. History of coronary artery disease.  EXAM: CHEST  2 VIEW  COMPARISON:  07/06/2014  FINDINGS: Patient had a transcatheter aortic valve replacement. There are median sternotomy wires. Heart size is normal. Patient has chronic interstitial lung prominence but there appears to be increased interstitial markings on this examination. No evidence for pleural effusions. Degenerative changes in the thoracic spine.  IMPRESSION: Increased interstitial lung densities are concerning for interstitial pulmonary edema. No focal airspace disease.   Electronically Signed   By: Markus Daft M.D.   On: 07/30/2014 10:05   Dg Chest 2  View  07/06/2014   CLINICAL DATA:  Congestive heart failure. Aortic valve replacement on Friday.  EXAM: CHEST  2 VIEW  COMPARISON:  Multiple priors, most recent 07/03/2014.  FINDINGS: Heart and mediastinal contours are stable. There are remote changes of median sternotomy for CABG. Aortic valve replacement noted. Lung volumes are normal. Pulmonary vascularity appears upper normal. Aeration of the lungs is improved. Previously seen interstitial markings have decreased, suggesting resolving edema. There are no focal airspace opacities or significant atelectasis.  Minimal blunting of the costophrenic angles posteriorly may reflect a tiny volume of pleural fluid.  Negative for pneumothorax.  Decreased bony mineralization. Multilevel degenerative changes of the thoracic spine.  IMPRESSION: Improving aeration of the lungs since the chest radiograph of 07/03/2014.  Probable tiny bilateral pleural effusions.   Electronically Signed   By: Curlene Dolphin M.D.   On: 07/06/2014 10:30   Dg Chest Port 1 View  07/03/2014   CLINICAL DATA:  Post TAVR  EXAM: PORTABLE CHEST - 1 VIEW  COMPARISON:  07/02/2014  FINDINGS: Right IJ Swan-Ganz catheter has been removed and the sheath remains in place. Minimal biapical pleural thickening. Lungs are slightly less well aerated than previously with improved patchy airspace opacities and persistent interstitial pulmonary edema. Trace pleural effusion. Slightly increased presumed to retrocardiac atelectasis. Evidence of median sternotomy.  IMPRESSION: Although the lungs are less well aerated overall, there is minimal improvement in apical aeration which could indicate improving pulmonary edema with residual mild alveolar and interstitial pulmonary edema.  Increased retrocardiac presumed atelectasis.   Electronically Signed   By: Conchita Paris M.D.   On: 07/03/2014 07:52   Dg Chest Port 1 View  07/02/2014   CLINICAL DATA:  Status post transcatheter aortic valve replacement.  EXAM: PORTABLE  CHEST - 1 VIEW  COMPARISON:  Jun 30, 2014.  FINDINGS: Stable cardiomediastinal silhouette. Sternotomy wires are noted. Interval placement of a aortic valve prosthesis. Right internal jugular Swan-Ganz catheter is noted with distal tip in expected position of main pulmonary artery. No pneumothorax is noted. Mildly increased interstitial densities are noted throughout both lungs concerning for pulmonary edema. No significant pleural effusion is noted.  IMPRESSION: Mildly increased interstitial densities are noted throughout both lungs concerning for pulmonary edema. Right internal jugular Swan-Ganz catheter tip is noted in expected position of main pulmonary artery.   Electronically Signed   By: Marijo Conception, M.D.   On: 07/02/2014 12:01   Cardiac cath: 05/10/2014 Left anterior descending (LAD): The LAD is severely calcified. The vessel is totally occluded proximally. Left circumflex (LCx): The left circumflex is severely diseased. There is 95% ostial stenosis. The obtuse marginal branches are occluded. The distal posterolateral branches are small, but  patent. There is diffuse 80-90% mid left circumflex stenosis. Right coronary artery (RCA): The RCA is dominant. The vessel is diffusely calcified. The mid RCA has 80% stenosis. The PLA branch is patent. The PDA branch has area severe diffuse disease with multiple sequential 90-95% stenoses. The vessel is small beyond the area of severe stenosis.  Saphenous vein graft to OM 2: The graft is patent. The mid body of the graft as eccentric 75% stenosis. Beyond the graft insertion site, the native OM 2 is severely diseased with 95% stenosis into a small subbranch. The first OM is also small and it fills retrograde from the graft insertion site.  LIMA to LAD: The LIMA is a large conduit. The vessel is widely patent. The anastomotic site is intact. The LAD is patent and there is 80% stenosis in the apical portion of the LAD. The diagonal and septal perforator branches  all fill from the graft.  Final Conclusions:  1. Severe three-vessel coronary artery disease 2. Status post aortocoronary bypass surgery with continued patency of the LIMA to LAD and saphenous vein graft to OM 2 3. Severe aortic stenosis 4. Known moderate to severe mitral regurgitation based on echo findings 5. Moderate point hypertension, likely related to left heart disease  ASSESSMENT AND PLAN Active Problems:   NSTEMI (non-ST elevated myocardial infarction)   81 year old male with known CAD, post recent TAVR admitted with acute onset DO  1. NSTEMI - He has known 75% stenosis in the SVG to OM2. Trop trend 1.15->2.87->3.67-->3.83. Trop level higher for the demand ischemicafor acute CHF and acute on CKD.   - Creatinine improves to 1.79 from 1.89, currently chest pain free.  - EKG today not changed from yesterdays.  - Continue ASA, Plavix, atorvastatin, metoprolol, heparin drip   2. Acute on chronic diastolic CHF  - Volume overload on exam, this could have been precipitated by steroid injection given 6/23. - Started  lasix 20 mg iv BID yesterday, creatinine improved. Continue to monitor. Home dose lasix po 10mg  daily. Baseline weight 135lbs. - I/O negative 319ml. Weight 162->161lb.    3. Hemoptysis  - No further episode, possibly sec to acute CHF, he also had tiny pulmonary nodules on CTA in May 2016 and follow-up CTA was recommended in 6-12 months. We will not repeat right now unless his hemoptysis worsens considering his acute on CKD. -   4. Acute on CKD, baseline Crea 1.18, today 1.79, we will monitor closely. Held lisinopril  5. S/P TAVR - well functioning valve.  6. HTN -Stable on home meds  Signed, Bhagat,Bhavinkumar PA-C Pager [phone]  I have seen, examined the patient, and reviewed the above assessment and plan.  Comfortable on exam.  Remains volume overloaded. Changes to above are made where necessary.  This is a very frail and tenuous patient with high risks of  decompensation.  His prognosis is probably poor long term.  I would advise conservative measures.  As he is pain free and given recent cath findings + renal failure, I would advise medical therapy going forward.  Continue gentle diuresis.  Probably home early next week if he continues to improve. Consider repeat echo to evaluate TAVR if he has any further issues.   Co Sign: Thompson Grayer, MD 07/31/2014 12:03 PM

## 2014-07-31 NOTE — Progress Notes (Signed)
ANTICOAGULATION CONSULT NOTE - Initial Consult  Pharmacy Consult for heparin Indication: chest pain/ACS  Allergies  Allergen Reactions  . Beta Adrenergic Blockers     Too low  . Hydrocodone Rash    Patient Measurements: Height: 5\' 5"  (165.1 cm) Weight: 161 lb 4.4 oz (73.155 kg) IBW/kg (Calculated) : 61.5 Heparin Dosing Weight: 61kg  Vital Signs: Temp: 98 F (36.7 C) (06/25 0404) Temp Source: Oral (06/25 0404) BP: 113/54 mmHg (06/25 0953) Pulse Rate: 63 (06/25 0953)  Labs:  Recent Labs  07/30/14 0915 07/30/14 1245 07/30/14 1500 07/30/14 1724 07/30/14 2055 07/31/14 0009 07/31/14 0551 07/31/14 0554  HGB 11.4*  --  11.3*  --   --   --  9.9*  --   HCT 32.8*  --  32.3*  --   --   --  28.7*  --   PLT 231  --  233  --   --   --  223  --   HEPARINUNFRC  --   --   --   --  0.41  --   --  0.35  CREATININE 1.89*  --  1.79*  --   --   --   --   --   TROPONINI 1.51* 2.87*  --  3.67*  --  3.83*  --   --     Estimated Creatinine Clearance: 25.8 mL/min (by C-G formula based on Cr of 1.79).   Medical History: Past Medical History  Diagnosis Date  . Hypertension   . Asthma   . Gout   . Hyperlipidemia   . Aortic stenosis   . CAD (coronary artery disease)   . Inguinal hernia, right 05/15/2013    "they couldn't do OR"  . COPD (chronic obstructive pulmonary disease) 10/11/2011  . Coronary artery disease involving native coronary artery   . Coronary artery disease involving coronary bypass graft 05/10/2014  . Zenker's diverticulum 05/21/2014  . Chronic combined systolic and diastolic congestive heart failure   . Mitral regurgitation   . Anginal pain   . Shortness of breath dyspnea   . GERD (gastroesophageal reflux disease)   . 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 07/02/2014 TAVR: 23 mm Edwards Sapien 3 transcatheter heart valve placed via open right transfemoral approach;  b. 07/03/2014 Echo: EF 50-55%, no central or paravalvular leak, nl transaortic  gradients, mod MR, mildly dil LA, mildly reduced RV fxn, PASP 46 mmHg.  Marland Kitchen Heart murmur   . Stroke 1965    denies residual on 07/30/2014  . Arthritis     "all over"  . Chronic neck pain   . Chronic pain of left knee   . Chronic kidney disease (CKD)   . Skin cancer     REMOVED L ELBOW  . NSTEMI (non-ST elevated myocardial infarction) 07/30/2014   Assessment: 88 YOM who presents on 07/30/2014 with difficulty breathing, elevated troponin/NSTEMI. He had a recent TAVR in May on ASA and Plavix PTA. He is having hemoptysis, but cardiology ok with starting heparin and monitoring.  HL this am remains therapeutic at 0.35. Hgb has trended down 11.3>>9.9, plt remain wnl and stable.   Goal of Therapy:  Heparin level 0.3-0.7 units/ml Monitor platelets by anticoagulation protocol: Yes   Plan:  - Continue heparin gtt at 800 units/hr - Daily HL and CBC - Follow closely for worsening hemoptysis or other signs and symptoms of bleeding  Catricia Scheerer K. Velva Harman, PharmD, [place] Clinical Pharmacist - Resident Pager: [phone] Pharmacy: [phone] 07/31/2014  10:11 AM

## 2014-08-01 LAB — HEPARIN LEVEL (UNFRACTIONATED): Heparin Unfractionated: 0.24 IU/mL — ABNORMAL LOW (ref 0.30–0.70)

## 2014-08-01 LAB — BASIC METABOLIC PANEL
Anion gap: 11 (ref 5–15)
BUN: 61 mg/dL — ABNORMAL HIGH (ref 6–20)
CO2: 24 mmol/L (ref 22–32)
CREATININE: 1.86 mg/dL — AB (ref 0.61–1.24)
Calcium: 9.4 mg/dL (ref 8.9–10.3)
Chloride: 99 mmol/L — ABNORMAL LOW (ref 101–111)
GFR, EST AFRICAN AMERICAN: 36 mL/min — AB (ref >60)
GFR, EST NON AFRICAN AMERICAN: 31 mL/min — AB (ref >60)
Glucose, Bld: 124 mg/dL — ABNORMAL HIGH (ref 65–99)
POTASSIUM: 4 mmol/L (ref 3.5–5.1)
SODIUM: 134 mmol/L — AB (ref 135–145)

## 2014-08-01 LAB — CBC
HCT: 30.1 % — ABNORMAL LOW (ref 39.0–52.0)
HEMOGLOBIN: 10.3 g/dL — AB (ref 13.0–17.0)
MCH: 32.8 pg (ref 26.0–34.0)
MCHC: 34.2 g/dL (ref 30.0–36.0)
MCV: 95.9 fL (ref 78.0–100.0)
PLATELETS: 216 10*3/uL (ref 150–400)
RBC: 3.14 MIL/uL — ABNORMAL LOW (ref 4.22–5.81)
RDW: 16.1 % — ABNORMAL HIGH (ref 11.5–15.5)
WBC: 9 10*3/uL (ref 4.0–10.5)

## 2014-08-01 MED ORDER — LIDOCAINE 5 % EX PTCH
1.0000 | MEDICATED_PATCH | CUTANEOUS | Status: DC
  Administered 2014-08-01 – 2014-08-02 (×2): 1 via TRANSDERMAL
  Filled 2014-08-01 (×2): qty 1

## 2014-08-01 NOTE — Progress Notes (Signed)
Spoke with Dr Wynonia Lawman. Told to keep an eye on pt, non-symptomatic and sleeping. Will continue to monitor

## 2014-08-01 NOTE — Progress Notes (Signed)
Paged Dr Wynonia Lawman for a 6 beat run of [place] at [phone]. Awaiting callback

## 2014-08-01 NOTE — Progress Notes (Signed)
ANTICOAGULATION CONSULT NOTE - Follow Up Consult  Pharmacy Consult for Heparin  Indication: chest pain/ACS  Allergies  Allergen Reactions  . Beta Adrenergic Blockers     Too low  . Hydrocodone Rash    Patient Measurements: Height: 5\' 5"  (165.1 cm) Weight: 128 lb 1.6 oz (58.106 kg) IBW/kg (Calculated) : 61.5  Vital Signs: Temp: 98 F (36.7 C) (06/26 0507) Temp Source: Oral (06/26 0507) BP: 145/73 mmHg (06/26 0507) Pulse Rate: 68 (06/26 0507)  Labs:  Recent Labs  07/30/14 0915 07/30/14 1245 07/30/14 1500 07/30/14 1724 07/30/14 2055 07/31/14 0009 07/31/14 0551 07/31/14 0554 08/01/14 0403  HGB 11.4*  --  11.3*  --   --   --  9.9*  --  10.3*  HCT 32.8*  --  32.3*  --   --   --  28.7*  --  30.1*  PLT 231  --  233  --   --   --  223  --  216  HEPARINUNFRC  --   --   --   --  0.41  --   --  0.35 0.24*  CREATININE 1.89*  --  1.79*  --   --   --   --   --   --   TROPONINI 1.51* 2.87*  --  3.67*  --  3.83*  --   --   --     Estimated Creatinine Clearance: 24.3 mL/min (by C-G formula based on Cr of 1.79).  Assessment: Sub-therapeutic heparin level, has been trending down, no issues per RN.  Goal of Therapy:  Heparin level 0.3-0.7 units/ml Monitor platelets by anticoagulation protocol: Yes   Plan:  -Increase heparin to 900 units/hr -1300 HL -Daily CBC/HL -Monitor for bleeding  Narda Bonds 08/01/2014,5:30 AM

## 2014-08-01 NOTE — Progress Notes (Signed)
81 year old male with known CAD, post recent TAVR admitted with acute onset DO  Subjective: No chest pain, no SOB, has neck pain and patch in place for pain, this is chronic   Objective: Vital signs in last 24 hours: Temp:  [97.6 F (36.4 C)-98.4 F (36.9 C)] 98 F (36.7 C) (06/26 0507) Pulse Rate:  [68-92] 92 (06/26 0907) Resp:  [19-20] 20 (06/26 0507) BP: (116-145)/(54-96) 119/54 mmHg (06/26 0907) SpO2:  [95 %-100 %] 100 % (06/26 0943) Weight:  [128 lb 1.6 oz (58.106 kg)] 128 lb 1.6 oz (58.106 kg) (06/26 0507) Weight change: -6 lb 14.4 oz (-3.13 kg) Last BM Date: 07/31/14 Intake/Output from previous day: -761  (since admit -1091)  wts are all over- 135->162->161->128 lbs  06/25 0701 - 06/26 0700 In: 238.5 [I.V.:238.5] Out: 1000 [Urine:1000] Intake/Output this shift: Total I/O In: 240 [P.O.:240] Out: -   PE: General:Pleasant affect, NAD Skin:Warm and dry, brisk capillary refill HEENT:normocephalic, sclera clear, mucus membranes moist Neck:supple, no JVD, no bruits  Heart:S1S2 RRR with 2.6 sytolic murmur, no gallup, rub or click Lungs:clear without rales, rhonchi, or wheezes EPP:IRJJ, non tender, + BS, do not palpate liver spleen or masses Ext:no lower ext edema, 2+ pedal pulses, 2+ radial pulses Neuro:alert and oriented, MAE, follows commands, + facial symmetry Tele:  SR with PACs at times ST up to 120 may be with exertion   Lab Results:  Recent Labs  07/31/14 0551 08/01/14 0403  WBC 8.3 9.0  HGB 9.9* 10.3*  HCT 28.7* 30.1*  PLT 223 216   BMET  Recent Labs  07/30/14 0915 07/30/14 1500  NA 130* 132*  K 4.4 4.1  CL 98* 99*  CO2 20* 19*  GLUCOSE 154* 156*  BUN 42* 40*  CREATININE 1.89* 1.79*  CALCIUM 9.4 9.2    Recent Labs  07/30/14 1724 07/31/14 0009  TROPONINI 3.67* 3.83*    Lab Results  Component Value Date   CHOL 128 05/07/2014   HDL 60 05/07/2014   LDLCALC 52 05/07/2014   TRIG 80 05/07/2014   CHOLHDL 2.1 05/07/2014   Lab  Results  Component Value Date   HGBA1C 5.9* 06/30/2014     Lab Results  Component Value Date   TSH 1.608 09/11/2013    Hepatic Function Panel No results for input(s): PROT, ALBUMIN, AST, ALT, ALKPHOS, BILITOT, BILIDIR, IBILI in the last 72 hours. No results for input(s): CHOL in the last 72 hours. No results for input(s): PROTIME in the last 72 hours.     Studies/Results: Dg Chest 2 View  07/31/2014   CLINICAL DATA:  Congestive heart failure and hemoptysis, history of aortic valve replacement  EXAM: CHEST - 2 VIEW  COMPARISON:  07/30/2014  FINDINGS: Cardiac shadow is stable. Changes consistent with prior transcatheter aortic valve replacement are noted. Coronary bypass grafting changes are noted as well. Mild vascular congestion is noted superimposed over diffuse interstitial changes likely of a chronic nature. No focal confluent infiltrate is seen. No sizable effusion is noted.  IMPRESSION: Postoperative changes.  Mild vascular congestion superimposed over chronic interstitial changes.   Electronically Signed   By: Inez Catalina M.D.   On: 07/31/2014 08:30   Dg Chest 2 View  07/30/2014   CLINICAL DATA:  Shortness of breath. History of coronary artery disease.  EXAM: CHEST  2 VIEW  COMPARISON:  07/06/2014  FINDINGS: Patient had a transcatheter aortic valve replacement. There are median sternotomy wires. Heart size is normal. Patient  has chronic interstitial lung prominence but there appears to be increased interstitial markings on this examination. No evidence for pleural effusions. Degenerative changes in the thoracic spine.  IMPRESSION: Increased interstitial lung densities are concerning for interstitial pulmonary edema. No focal airspace disease.   Electronically Signed   By: Markus Daft M.D.   On: 07/30/2014 10:05    Medications: I have reviewed the patient's current medications. Scheduled Meds: . allopurinol  100 mg Oral Daily  . aspirin EC  81 mg Oral Daily  . atorvastatin  40 mg  Oral Daily  . budesonide-formoterol  2 puff Inhalation BID  . cholecalciferol  400 Units Oral Daily  . clopidogrel  75 mg Oral Q breakfast  . feeding supplement (ENSURE ENLIVE)  237 mL Oral BID BM  . furosemide  20 mg Intravenous BID  . lisinopril  10 mg Oral Daily   And  . hydrochlorothiazide  12.5 mg Oral Daily  . isosorbide mononitrate  30 mg Oral Daily  . lidocaine  1 patch Transdermal Q24H  . metoprolol tartrate  12.5 mg Oral BID  . mirtazapine  15 mg Oral QHS  . pantoprazole  40 mg Oral Daily  . vitamin C  250 mg Oral Daily   Continuous Infusions: . heparin 900 Units/hr (08/01/14 0538)   PRN Meds:.albuterol  Assessment/Plan: Active Problems:   NSTEMI (non-ST elevated myocardial infarction)   Acute on chronic diastolic ACC/AHA stage C congestive heart failure   Acute renal failure syndrome    1. NSTEMI - He has known 75% stenosis in the SVG to OM2. Trop trend 1.15->2.87->3.67-->3.83. Trop level higher for the demand ischemicafor acute CHF and acute on CKD.  - Creatinine improves to 1.79 from 1.89, currently chest pain free. check BMP today - EKG today not changed from yesterday and occ PAC, + LVH.  - Continue ASA, Plavix, atorvastatin, metoprolol - heparin drip has been on for 48 hours, continue or stop ?   -ambulate     2. Acute on chronic diastolic CHF  - Volume overload on exam, this could have been precipitated by steroid injection given 6/23. - Started lasix 20 mg iv BID yesterday, creatinine improved. Continue to monitor. Home dose lasix po 10mg  daily. Baseline weight 135lbs. - I/O negative 366ml. Weight 162->161lb. Weights are not reliable- today 128    3. Hemoptysis  - No further episode, possibly sec to acute CHF, he also had tiny pulmonary nodules on CTA in May 2016 and follow-up CTA was recommended in 6-12 months. We will not repeat right now unless his hemoptysis worsens considering his acute on CKD. ? Send to pulmonary nodule clinic -   4. Acute  on CKD, baseline Crea 1.18, today 1.79, we will monitor closely. Holding lisinopril recheck BMP today  5. S/P TAVR - well functioning valve.  6. HTN -Stable on home meds   LOS: 2 days   Time spent with pt. : 15 minutes. [facility] R  Nurse Practitioner Certified Pager [phone] or after 5pm and on weekends call [phone] 08/01/2014, 10:01 AM     I have seen, examined the patient, and reviewed the above assessment and plan.  On exam, he is ambulatory and appears well. Changes to above are made where necessary.   Conservative management seems appropriate as he is improving.  He wants to go home. Stop IV heparin today.  Possible discharge tomorrow.  Will alert Drs [employer] of his admit.  Co Sign: Thompson Grayer, MD 08/01/2014 11:41 AM

## 2014-08-02 ENCOUNTER — Other Ambulatory Visit: Payer: Self-pay | Admitting: Cardiology

## 2014-08-02 DIAGNOSIS — N289 Disorder of kidney and ureter, unspecified: Principal | ICD-10-CM

## 2014-08-02 DIAGNOSIS — N189 Chronic kidney disease, unspecified: Secondary | ICD-10-CM

## 2014-08-02 LAB — BASIC METABOLIC PANEL
Anion gap: 10 (ref 5–15)
BUN: 64 mg/dL — ABNORMAL HIGH (ref 6–20)
CALCIUM: 9.3 mg/dL (ref 8.9–10.3)
CO2: 24 mmol/L (ref 22–32)
CREATININE: 1.78 mg/dL — AB (ref 0.61–1.24)
Chloride: 99 mmol/L — ABNORMAL LOW (ref 101–111)
GFR calc Af Amer: 38 mL/min — ABNORMAL LOW (ref >60)
GFR calc non Af Amer: 33 mL/min — ABNORMAL LOW (ref >60)
GLUCOSE: 108 mg/dL — AB (ref 65–99)
Potassium: 4 mmol/L (ref 3.5–5.1)
Sodium: 133 mmol/L — ABNORMAL LOW (ref 135–145)

## 2014-08-02 LAB — CBC
HEMATOCRIT: 34 % — AB (ref 39.0–52.0)
HEMOGLOBIN: 11.7 g/dL — AB (ref 13.0–17.0)
MCH: 32.1 pg (ref 26.0–34.0)
MCHC: 34.4 g/dL (ref 30.0–36.0)
MCV: 93.4 fL (ref 78.0–100.0)
Platelets: 227 10*3/uL (ref 150–400)
RBC: 3.64 MIL/uL — AB (ref 4.22–5.81)
RDW: 15.9 % — ABNORMAL HIGH (ref 11.5–15.5)
WBC: 9.3 10*3/uL (ref 4.0–10.5)

## 2014-08-02 MED ORDER — FUROSEMIDE 20 MG PO TABS: 20.0000 mg | ORAL_TABLET | Freq: Every day | ORAL | Status: DC

## 2014-08-02 NOTE — Progress Notes (Signed)
Discarged home with family; self care

## 2014-08-02 NOTE — Progress Notes (Signed)
Pt had another run of computer labeled VTach at [place]. Is currently sleeping and non-symptomatic.

## 2014-08-02 NOTE — Discharge Summary (Signed)
Physician Discharge Summary  Patient ID: Eric Buchanan MRN: [id number] DOB/AGE: Jan 19, 1926 81 y.o.  Admit date: 07/30/2014 Discharge date: 08/02/2014  Admission Diagnoses: NSTEMI  Discharge Diagnoses:  Active Problems:   NSTEMI (non-ST elevated myocardial infarction)   Acute on chronic diastolic ACC/AHA stage C congestive heart failure   Acute renal failure syndrome   Discharged Condition: stable  Hospital Course: 81 y/o male with a h/o CAD s/p CABG, HTN, HL, and CKD III. In 03/2014, he was found to have severe aortic stenosis with moderate to severe mitral regurgitation. He underwent a right and left heart catheterization in April revealing severe native CAD with a patent LIMA LAD and patent SVG OM2. He was then evaluated but the multi-disciplinary TAVR team and it was ultimately felt that he would benefit most from TAVR. He underwent successfully TAVR on 07/02/14 using a 58mm Edwards Sapien 3 THV bioprosthetic Aortic Valve via the transfemoral approach. He tolerated the procedure well and post procedure was maintained on ASA, plavix, and low dose beta blocker therapy. Follow-up 2D echo on 5/28 revealed normal LV function with no evidence of central or paravalvular leak.He was discharged home on 07/06/14.   He presented back to [facility] on 07/30/14 with complaints of sudden onset SOB, chest pain and hemoptysis - 3 x with mild blood tinged sputum the size of a dime each time. He was found to have mild interstitial edema on CXR and elevated troponin x 3 with a peak of 3.67. He was also noted to have acute on chronic renal insufficiency with Sr at 1.89 day of admission. There were no EKG changes. Given his abnormal renal function, medical therapy was recommended. He was placed on IV heparin and continued on ASA, Plavix, Atorvastatin, metoprolol and Imdur. With control of his symptoms with medical therapy and continued abnormal renal function, it was decided not to pursue a LHC. His acute CHF was  treated with IV Lasix therapy and he had a good diuretic response and his dyspnea resolved. He was transitioned to low dose PO Lasix and his HCTZ was discontinued. In regards to his hemoptysis, he was observed and had no further recurrence. It was felt that this may have been secondary to acute CHF.   On hospital day 3, he was seen and examined by Dr. Marlou Porch who determined he was stable for discharge home. Scr day of discharge was 1.78. His lisinopril was discontinued on discharge and an OP BMP was ordered for 1 week. Will reassess as an OP and will restart lisinopril if renal function is improved. He is scheduled for a f/u appointment with Dr. Burt Knack on 08/11/14.     Consults: None  Significant Diagnostic Studies:  Cardiac Panel (last 3 results)  Recent Labs  07/30/14 1245 07/30/14 1724 07/31/14 0009  TROPONINI 2.87* 3.67* 3.83*    Treatments: See Hospital Course  Discharge Exam: Blood pressure 120/69, pulse 74, temperature 98.1 F (36.7 C), temperature source Oral, resp. rate 18, height 5\' 5"  (1.651 m), weight 127 lb 12.8 oz (57.97 kg), SpO2 98 %.   Disposition: 06-Home-Health Care Svc      Discharge Instructions    Diet - low sodium heart healthy    Complete by:  As directed      Increase activity slowly    Complete by:  As directed             Medication List    STOP taking these medications        lisinopril-hydrochlorothiazide 20-25 MG per tablet  Commonly known as:  PRINZIDE,ZESTORETIC      TAKE these medications        albuterol 108 (90 BASE) MCG/ACT inhaler  Commonly known as:  PROVENTIL HFA;VENTOLIN HFA  Inhale 1 puff into the lungs every 4 (four) hours as needed for shortness of breath.     allopurinol 100 MG tablet  Commonly known as:  ZYLOPRIM  Take 1 tablet (100 mg total) by mouth daily.     aspirin 81 MG tablet  Take 81 mg by mouth at bedtime.     atorvastatin 40 MG tablet  Commonly known as:  LIPITOR  Take 1 tablet (40 mg total) by mouth  daily.     budesonide-formoterol 160-4.5 MCG/ACT inhaler  Commonly known as:  SYMBICORT  Inhale 2 puffs into the lungs 2 (two) times daily.     clopidogrel 75 MG tablet  Commonly known as:  PLAVIX  Take 1 tablet (75 mg total) by mouth daily with breakfast.     fluticasone 50 MCG/ACT nasal spray  Commonly known as:  FLONASE  One spray in each nostril twice a day, use left hand for right nostril, and right hand for left nostril.     furosemide 20 MG tablet  Commonly known as:  LASIX  Take 1 tablet (20 mg total) by mouth daily.     isosorbide mononitrate 30 MG 24 hr tablet  Commonly known as:  IMDUR  Take 1 tablet (30 mg total) by mouth daily.     metoprolol tartrate 25 MG tablet  Commonly known as:  LOPRESSOR  Take 0.5 tablets (12.5 mg total) by mouth 2 (two) times daily.     mirtazapine 15 MG tablet  Commonly known as:  REMERON  Take 1 tablet (15 mg total) by mouth at bedtime.     pantoprazole 40 MG tablet  Commonly known as:  PROTONIX  Take 1 tablet (40 mg total) by mouth daily.     SPIRIVA RESPIMAT 2.5 MCG/ACT Aers  Generic drug:  Tiotropium Bromide Monohydrate  Inhale 2.5 mg into the lungs every morning.     VITAMIN C PO  Take 1 tablet by mouth daily.     VITAMIN D PO  Take 1 tablet by mouth daily.       Follow-up Information    Follow up with Sherren Mocha, MD.   Specialty:  Cardiology   Why:  9:30 am (you will also need to get blood work done at our office same day to check kidney function)   Contact information:   [address] [address] [phone]      TIME SPENT ON DISCHARGE, INCLUDING PHYSICIAN TIME: >30 MINUTES  Signed: Lyda Jester 08/02/2014, 11:21 AM

## 2014-08-02 NOTE — Progress Notes (Signed)
Patient Profile: 81 year old male with known CAD, status post recent TAVR 07/02/14 admitted with acute onset DO  Subjective: No chest pain or dyspnea.   Objective: Vital signs in last 24 hours: Temp:  [97.3 F (36.3 C)-98.2 F (36.8 C)] 98.1 F (36.7 C) (06/27 0500) Pulse Rate:  [92-99] 99 (06/27 0500) Resp:  [17-22] 20 (06/27 0500) BP: (109-120)/(54-69) 120/69 mmHg (06/27 0500) SpO2:  [97 %-100 %] 99 % (06/27 0500) Weight:  [127 lb 12.8 oz (57.97 kg)] 127 lb 12.8 oz (57.97 kg) (06/27 0500) Last BM Date: 08/01/14  Intake/Output from previous day: 06/26 0701 - 06/27 0700 In: 480 [P.O.:480] Out: 100 [Urine:100] Intake/Output this shift:    Medications Current Facility-Administered Medications  Medication Dose Route Frequency Provider Last Rate Last Dose  . albuterol (PROVENTIL) (2.5 MG/3ML) 0.083% nebulizer solution 2.5 mg  2.5 mg Inhalation Q4H PRN Dorothy Spark, MD      . allopurinol (ZYLOPRIM) tablet 100 mg  100 mg Oral Daily Dorothy Spark, MD   100 mg at 08/01/14 0906  . aspirin EC tablet 81 mg  81 mg Oral Daily Dorothy Spark, MD   81 mg at 08/01/14 6256  . atorvastatin (LIPITOR) tablet 40 mg  40 mg Oral Daily Dorothy Spark, MD   40 mg at 08/01/14 0906  . budesonide-formoterol (SYMBICORT) 160-4.5 MCG/ACT inhaler 2 puff  2 puff Inhalation BID Dorothy Spark, MD   2 puff at 08/01/14 2007  . cholecalciferol (VITAMIN D) tablet 400 Units  400 Units Oral Daily Dorothy Spark, MD   400 Units at 08/01/14 [id number]  . clopidogrel (PLAVIX) tablet 75 mg  75 mg Oral Q breakfast Dorothy Spark, MD   75 mg at 08/01/14 0906  . feeding supplement (ENSURE ENLIVE) (ENSURE ENLIVE) liquid 237 mL  237 mL Oral BID BM Sherren Mocha, MD   237 mL at 08/01/14 1723  . furosemide (LASIX) injection 20 mg  20 mg Intravenous BID Dorothy Spark, MD   20 mg at 08/01/14 1722  . lisinopril (PRINIVIL,ZESTRIL) tablet 10 mg  10 mg Oral Daily Theone Murdoch Hammons, RPH   10 mg at 08/01/14  0910   And  . hydrochlorothiazide (MICROZIDE) capsule 12.5 mg  12.5 mg Oral Daily Theone Murdoch Hammons, RPH   12.5 mg at 08/01/14 0907  . isosorbide mononitrate (IMDUR) 24 hr tablet 30 mg  30 mg Oral Daily Dorothy Spark, MD   30 mg at 08/01/14 0907  . lidocaine (LIDODERM) 5 % 1 patch  1 patch Transdermal Q24H Flossie Dibble, MD   1 patch at 08/02/14 0534  . metoprolol tartrate (LOPRESSOR) tablet 12.5 mg  12.5 mg Oral BID Dorothy Spark, MD   12.5 mg at 08/01/14 2045  . mirtazapine (REMERON) tablet 15 mg  15 mg Oral QHS Dorothy Spark, MD   15 mg at 08/01/14 2044  . pantoprazole (PROTONIX) EC tablet 40 mg  40 mg Oral Daily Dorothy Spark, MD   40 mg at 08/01/14 0905  . vitamin C (ASCORBIC ACID) tablet 250 mg  250 mg Oral Daily Dorothy Spark, MD   250 mg at 08/01/14 7342    PE: General appearance: alert, cooperative and no distress Neck: no carotid bruit and no JVD Lungs: clear to auscultation bilaterally Heart: regular rate and rhythm, S1, S2 normal, no murmur, click, rub or gallop Extremities: no LEE Pulses: 2+ and symmetric Skin: warm and dry Neurologic: Grossly normal  Lab Results:   Recent Labs  07/31/14 0551 08/01/14 0403 08/02/14 0315  WBC 8.3 9.0 9.3  HGB 9.9* 10.3* 11.7*  HCT 28.7* 30.1* 34.0*  PLT 223 216 227   BMET  Recent Labs  07/30/14 1500 08/01/14 1314 08/02/14 0315  NA 132* 134* 133*  K 4.1 4.0 4.0  CL 99* 99* 99*  CO2 19* 24 24  GLUCOSE 156* 124* 108*  BUN 40* 61* 64*  CREATININE 1.79* 1.86* 1.78*  CALCIUM 9.2 9.4 9.3   Cardiac Panel (last 3 results)  Recent Labs  07/30/14 1245 07/30/14 1724 07/31/14 0009  TROPONINI 2.87* 3.67* 3.83*     Assessment/Plan  Active Problems:   NSTEMI (non-ST elevated myocardial infarction)   Acute on chronic diastolic ACC/AHA stage C congestive heart failure   Acute renal failure syndrome   1. NSTEMI: Trop trend 1.15->2.87->3.67-->3.83. Conservative therapy elected given abnormal renal  function. CP free. No dyspnea. Continue ASA, Plavix, Lipitor and metoprolol.   2. Acute on Chronic Diastolic CHF: no significant edema on exam today. Will transition to PO Lasix and will d/c HCTZ. Continue BB therapy. Hold ACE until OP f/u given renal function.   3. Hemoptysis: No further episode, possibly sec to acute CHF, he also had tiny pulmonary nodules on CTA in May 2016 and follow-up CTA was recommended in 6-12 months.? Send to pulmonary nodule clinic  4. Acute on CKD: baseline Crea 1.18. Improving now at 1.78 (1.86 yesterday). Will hold lisinopril until OP f/u. Will order OP BMP at that time.   5. S/P TAVR - well functioning valve.  6. HTN: well controlled on current regimen.    LOS: 3 days    Brittainy M. Ladoris Gene 08/02/2014 7:54 AM  Personally seen and examined. Agree with above. No complaints, wants to go home Tele with occasional PVC's OK with low dose lasix at home instead of HCTZ. Watch Creat TAVR - doing well.  Candee Furbish, MD

## 2014-08-06 ENCOUNTER — Ambulatory Visit (INDEPENDENT_AMBULATORY_CARE_PROVIDER_SITE_OTHER): Payer: Medicare Other | Admitting: Sports Medicine

## 2014-08-06 ENCOUNTER — Encounter: Payer: Self-pay | Admitting: Sports Medicine

## 2014-08-06 VITALS — BP 109/56 | HR 80 | Wt 130.0 lb

## 2014-08-06 DIAGNOSIS — I5042 Chronic combined systolic (congestive) and diastolic (congestive) heart failure: Secondary | ICD-10-CM | POA: Diagnosis not present

## 2014-08-06 DIAGNOSIS — M1712 Unilateral primary osteoarthritis, left knee: Secondary | ICD-10-CM | POA: Diagnosis not present

## 2014-08-06 DIAGNOSIS — F32A Depression, unspecified: Secondary | ICD-10-CM

## 2014-08-06 DIAGNOSIS — F320 Major depressive disorder, single episode, mild: Secondary | ICD-10-CM

## 2014-08-06 DIAGNOSIS — F329 Major depressive disorder, single episode, unspecified: Secondary | ICD-10-CM | POA: Diagnosis not present

## 2014-08-06 NOTE — Addendum Note (Signed)
Addended by: Silverio Decamp on: 08/06/2014 12:53 PM   Modules accepted: Orders, Medications

## 2014-08-06 NOTE — Assessment & Plan Note (Signed)
euvolemic on exam today with no lower extremity edema, and no bibasilar crackles. Also no hypertension. We are going to leave his furosemide, as well as beta blocker doses the same. I will see him weekly for at least 2 more weeks for his Orthovisc injections and we can continue to track his weight.

## 2014-08-06 NOTE — Progress Notes (Signed)
  Subjective:    CC: follow-up  HPI: Left knee osteoarthritis: Here for Orthovisc injection #2  Congestive heart failure: Post transcatheter aortic valve replacement, he did have a bit of an exacerbation, he was hospitalized twice, he also had some hemoptysis, Plavix was discontinued. Since then he's been doing well on his current dose of Lasix, as well as his metoprolol. He is down approximately 7 pounds, and is clinically feeling well with regards to his breathing.  Past medical history, Surgical history, Family history not pertinant except as noted below, Social history, Allergies, and medications have been entered into the medical record, reviewed, and no changes needed.   Review of Systems: No fevers, chills, night sweats, weight loss, chest pain, or shortness of breath.   Objective:    General: Well Developed, well nourished, and in no acute distress.  Neuro: Alert and oriented x3, extra-ocular muscles intact, sensation grossly intact.  HEENT: Normocephalic, atraumatic, pupils equal round reactive to light, neck supple, no masses, no lymphadenopathy, thyroid nonpalpable.  Skin: Warm and dry, no rashes. Cardiac: Regular rate and rhythm, no murmurs rubs or gallops, no lower extremity edema.  Respiratory: Clear to auscultation bilaterally. Not using accessory muscles, speaking in full sentences. Left Knee: Tender to palpation along the joint lines with mild effusion ROM normal in flexion and extension and lower leg rotation. Ligaments with solid consistent endpoints including ACL, PCL, LCL, MCL. Negative Mcmurray's and provocative meniscal tests. Non painful patellar compression. Patellar and quadriceps tendons unremarkable. Hamstring and quadriceps strength is normal.  Procedure: Real-time Ultrasound Guided Injection of left knee Device: GE Logiq E  Verbal informed consent obtained.  Time-out conducted.  Noted no overlying erythema, induration, or other signs of local infection.   Skin prepped in a sterile fashion.  Local anesthesia: Topical Ethyl chloride.  With sterile technique and under real time ultrasound guidance:  10 mL aspirated, syringe switched and 30 mg/2 mL of OrthoVisc (sodium hyaluronate) in a prefilled syringe was injected easily into the knee through a 22-gauge needle. Completed without difficulty  Pain immediately resolved suggesting accurate placement of the medication.  Advised to call if fevers/chills, erythema, induration, drainage, or persistent bleeding.  Images permanently stored and available for review in the ultrasound unit.  Impression: Technically successful ultrasound guided injection.  Impression and Recommendations:

## 2014-08-06 NOTE — Assessment & Plan Note (Signed)
Has not yet started mirtazapine. He does have a bit of failure to thrive. Certainly he should go and fill his mirtazapine 15 mg.

## 2014-08-06 NOTE — Assessment & Plan Note (Addendum)
Aspiration and Orthovisc injection #2 of 4 to the left knee, doing well. Return in one week for injection #3

## 2014-08-11 ENCOUNTER — Ambulatory Visit (HOSPITAL_COMMUNITY): Payer: Medicare Other | Attending: Cardiovascular Disease

## 2014-08-11 ENCOUNTER — Ambulatory Visit: Payer: Medicare Other | Admitting: Cardiovascular Disease

## 2014-08-11 ENCOUNTER — Other Ambulatory Visit: Payer: Self-pay

## 2014-08-11 ENCOUNTER — Other Ambulatory Visit: Payer: Self-pay | Admitting: Cardiovascular Disease

## 2014-08-11 ENCOUNTER — Other Ambulatory Visit (HOSPITAL_COMMUNITY): Payer: Self-pay | Admitting: Cardiovascular Disease

## 2014-08-11 DIAGNOSIS — I517 Cardiomegaly: Secondary | ICD-10-CM | POA: Insufficient documentation

## 2014-08-11 DIAGNOSIS — Z952 Presence of prosthetic heart valve: Secondary | ICD-10-CM | POA: Diagnosis present

## 2014-08-11 DIAGNOSIS — Z954 Presence of other heart-valve replacement: Secondary | ICD-10-CM | POA: Diagnosis not present

## 2014-08-11 DIAGNOSIS — I351 Nonrheumatic aortic (valve) insufficiency: Secondary | ICD-10-CM | POA: Insufficient documentation

## 2014-08-11 DIAGNOSIS — I340 Nonrheumatic mitral (valve) insufficiency: Secondary | ICD-10-CM | POA: Diagnosis not present

## 2014-08-12 ENCOUNTER — Ambulatory Visit: Payer: Medicare Other | Admitting: Sports Medicine

## 2014-08-13 ENCOUNTER — Telehealth: Payer: Self-pay

## 2014-08-13 ENCOUNTER — Ambulatory Visit (INDEPENDENT_AMBULATORY_CARE_PROVIDER_SITE_OTHER): Payer: Medicare Other | Admitting: Sports Medicine

## 2014-08-13 ENCOUNTER — Encounter: Payer: Self-pay | Admitting: Sports Medicine

## 2014-08-13 VITALS — BP 128/68 | HR 92 | Wt 128.0 lb

## 2014-08-13 DIAGNOSIS — M1712 Unilateral primary osteoarthritis, left knee: Secondary | ICD-10-CM

## 2014-08-13 DIAGNOSIS — F32A Depression, unspecified: Secondary | ICD-10-CM

## 2014-08-13 DIAGNOSIS — F329 Major depressive disorder, single episode, unspecified: Secondary | ICD-10-CM | POA: Diagnosis not present

## 2014-08-13 DIAGNOSIS — I5042 Chronic combined systolic (congestive) and diastolic (congestive) heart failure: Secondary | ICD-10-CM | POA: Diagnosis not present

## 2014-08-13 DIAGNOSIS — I2510 Atherosclerotic heart disease of native coronary artery without angina pectoris: Secondary | ICD-10-CM | POA: Diagnosis not present

## 2014-08-13 DIAGNOSIS — F320 Major depressive disorder, single episode, mild: Secondary | ICD-10-CM

## 2014-08-13 NOTE — Progress Notes (Signed)
  Subjective:    CC: follow-up  HPI: Left knee osteoarthritis: Here for Orthovisc injection #3  Congestive heart failure: Post transcatheter aortic valve replacement, he did have a bit of an exacerbation, he was hospitalized twice, he also had some hemoptysis, Plavix was discontinued. Since then he's been doing well on his current dose of Lasix, as well as his metoprolol. His weight has been stable since last visit, and is clinically feeling well with regards to his breathing.  Past medical history, Surgical history, Family history not pertinant except as noted below, Social history, Allergies, and medications have been entered into the medical record, reviewed, and no changes needed.   Review of Systems: No fevers, chills, night sweats, weight loss, chest pain, or shortness of breath.   Objective:    General: Well Developed, well nourished, and in no acute distress.  Neuro: Alert and oriented x3, extra-ocular muscles intact, sensation grossly intact.  HEENT: Normocephalic, atraumatic, pupils equal round reactive to light, neck supple, no masses, no lymphadenopathy, thyroid nonpalpable.  Skin: Warm and dry, no rashes. Cardiac: Regular rate and rhythm, no murmurs rubs or gallops, no lower extremity edema.  Respiratory: Clear to auscultation bilaterally. Not using accessory muscles, speaking in full sentences. Left Knee: Tender to palpation along the joint lines , no effusion ROM normal in flexion and extension and lower leg rotation. Ligaments with solid consistent endpoints including ACL, PCL, LCL, MCL. Negative Mcmurray's and provocative meniscal tests. Non painful patellar compression. Patellar and quadriceps tendons unremarkable. Hamstring and quadriceps strength is normal.  Procedure: Real-time Ultrasound Guided Injection of left knee Device: GE Logiq E  Verbal informed consent obtained.  Time-out conducted.  Noted no overlying erythema, induration, or other signs of local  infection.  Skin prepped in a sterile fashion.  Local anesthesia: Topical Ethyl chloride.  With sterile technique and under real time ultrasound guidance:  30 mg/2 mL of OrthoVisc (sodium hyaluronate) in a prefilled syringe was injected easily into the knee through a 22-gauge needle. Completed without difficulty  Pain immediately resolved suggesting accurate placement of the medication.  Advised to call if fevers/chills, erythema, induration, drainage, or persistent bleeding.  Images permanently stored and available for review in the ultrasound unit.  Impression: Technically successful ultrasound guided injection.  Impression and Recommendations:

## 2014-08-13 NOTE — Assessment & Plan Note (Signed)
Stable, euvolemic, no changes in medications

## 2014-08-13 NOTE — Assessment & Plan Note (Signed)
Orthovisc injection #3 into the left knee. Return in one week for #4.

## 2014-08-13 NOTE — Assessment & Plan Note (Signed)
Patient has not yet started Remeron.

## 2014-08-13 NOTE — Telephone Encounter (Signed)
Please place a referral for in home physical therapy and home health nurse for patient medication to ensure he is taking what he is supposed to be taking. Dorrene Bently,CMA

## 2014-08-13 NOTE — Telephone Encounter (Signed)
Double referral placed for home health physical therapy and nursing. Specifically stated to go to care [place].

## 2014-08-23 ENCOUNTER — Ambulatory Visit (INDEPENDENT_AMBULATORY_CARE_PROVIDER_SITE_OTHER): Payer: Medicare Other | Admitting: Sports Medicine

## 2014-08-23 VITALS — BP 113/63 | HR 81 | Ht 64.5 in | Wt 130.0 lb

## 2014-08-23 DIAGNOSIS — I5042 Chronic combined systolic (congestive) and diastolic (congestive) heart failure: Secondary | ICD-10-CM

## 2014-08-23 DIAGNOSIS — I2510 Atherosclerotic heart disease of native coronary artery without angina pectoris: Secondary | ICD-10-CM

## 2014-08-23 DIAGNOSIS — M1712 Unilateral primary osteoarthritis, left knee: Secondary | ICD-10-CM

## 2014-08-23 NOTE — Assessment & Plan Note (Addendum)
Continues to do well after aortic valve replacement,as far as quality of life goes he tells me the biggest impediment is intolerable nocturia, he blames this on the Lasix and wonders if we can discontinue this. After much bargaining, we decided to cut it in half with close follow-up on his volume status. I have also asked him to follow-up with cardiology and make sure to keep a log of his weights He will make sure that he is taking his medication in the morning first, before cutting the dose in half.

## 2014-08-23 NOTE — Progress Notes (Signed)
  Subjective:    CC: follow-up  HPI: Left knee osteoarthritis: Here for Orthovisc injection #4, overall knee is doing extremely well.  Congestive heart failure: continues to do well after aortic valve replacement,as far as quality of life goes he tells me the biggest impediment is waking up through the night to void, he blames this on the Lasix and wonders if we can discontinue this. After much bargaining, we decided to cut it in half with close follow-up on his volume status.I have also asked him to follow-up with cardiology much sooner.  Past medical history, Surgical history, Family history not pertinant except as noted below, Social history, Allergies, and medications have been entered into the medical record, reviewed, and no changes needed.   Review of Systems: No fevers, chills, night sweats, weight loss, chest pain, or shortness of breath.   Objective:    General: Well Developed, well nourished, and in no acute distress.  Neuro: Alert and oriented x3, extra-ocular muscles intact, sensation grossly intact.  HEENT: Normocephalic, atraumatic, pupils equal round reactive to light, neck supple, no masses, no lymphadenopathy, thyroid nonpalpable.  Skin: Warm and dry, no rashes. Cardiac: Regular rate and rhythm, no murmurs rubs or gallops, no lower extremity edema.  Respiratory: Clear to auscultation bilaterally. Not using accessory muscles, speaking in full sentences. Left Knee: Tender to palpation along the joint lines , no effusion ROM normal in flexion and extension and lower leg rotation. Ligaments with solid consistent endpoints including ACL, PCL, LCL, MCL. Negative Mcmurray's and provocative meniscal tests. Non painful patellar compression. Patellar and quadriceps tendons unremarkable. Hamstring and quadriceps strength is normal.  Procedure: Real-time Ultrasound Guided Injection of left knee Device: GE Logiq E  Verbal informed consent obtained.  Time-out conducted.  Noted  no overlying erythema, induration, or other signs of local infection.  Skin prepped in a sterile fashion.  Local anesthesia: Topical Ethyl chloride.  With sterile technique and under real time ultrasound guidance:  30 mg/2 mL of OrthoVisc (sodium hyaluronate) in a prefilled syringe was injected easily into the knee through a 22-gauge needle. Completed without difficulty  Pain immediately resolved suggesting accurate placement of the medication.  Advised to call if fevers/chills, erythema, induration, drainage, or persistent bleeding.  Images permanently stored and available for review in the ultrasound unit.  Impression: Technically successful ultrasound guided injection.  Impression and Recommendations:    I spent 25 minutes with this patient, greater than 50% was face-to-face time counseling regarding the above diagnoses, this time was separate from the time spent during the above procedure.

## 2014-08-23 NOTE — Assessment & Plan Note (Signed)
Orthovisc injection #4 of 4. We are going to look back into home health physical therapy.

## 2014-08-30 ENCOUNTER — Ambulatory Visit (INDEPENDENT_AMBULATORY_CARE_PROVIDER_SITE_OTHER): Payer: Medicare Other | Admitting: Cardiovascular Disease

## 2014-08-30 ENCOUNTER — Encounter: Payer: Self-pay | Admitting: Cardiovascular Disease

## 2014-08-30 VITALS — BP 130/62 | HR 81 | Ht 65.0 in | Wt 130.0 lb

## 2014-08-30 DIAGNOSIS — Z952 Presence of prosthetic heart valve: Secondary | ICD-10-CM

## 2014-08-30 DIAGNOSIS — I2510 Atherosclerotic heart disease of native coronary artery without angina pectoris: Secondary | ICD-10-CM | POA: Diagnosis not present

## 2014-08-30 DIAGNOSIS — Z954 Presence of other heart-valve replacement: Secondary | ICD-10-CM

## 2014-08-30 NOTE — Patient Instructions (Signed)
Medication Instructions:  Your physician recommends that you continue on your current medications as directed. Please refer to the Current Medication list given to you today.  Labwork: No new orders.   Testing/Procedures: Your physician has requested that you have an echocardiogram in 1 YEAR. Echocardiography is a painless test that uses sound waves to create images of your heart. It provides your doctor with information about the size and shape of your heart and how well your heart's chambers and valves are working. This procedure takes approximately one hour. There are no restrictions for this procedure.  Follow-Up: Your physician wants you to follow-up in: 1 YEAR with Dr Cooper.  You will receive a reminder letter in the mail two months in advance. If you don't receive a letter, please call our office to schedule the follow-up appointment.   Any Other Special Instructions Will Be Listed Below (If Applicable).   

## 2014-08-30 NOTE — Progress Notes (Signed)
Cardiology Office Note Date:  08/30/2014   ID:  Eric Buchanan, DOB 08/24/1925, MRN [id number]  PCP:  Aundria Mems, MD  Cardiologist:  Sherren Mocha, MD    Chief Complaint  Patient presents with  . Shortness of Breath     History of Present Illness: Eric Buchanan is a 81 y.o. male who presents for follow-up after TAVR. The patient underwent TAVR via a transfemoral approach Jul 02, 2014, with a 23 mm Sapien 3 THV.  His early postoperative recovery was uneventful, but he was readmitted 6/24 with acute diastolic HF, NSTEMI, and mild hemoptysis. He was also noted to have acute on chronic kidney injury. He was rehospitalized at [facility] a few days after discharge from [facility] with recurrent hemoptysis and plavix was discontinued. He presents today for follow-up evaluation.  He feels better. Still limited by DOE now with NYHA Class 2 symptoms. No orthopnea PND, or edema. No chest pain. Denies further bleeding or hemoptysis.    Past Medical History  Diagnosis Date  . Hypertension   . Asthma   . Gout   . Hyperlipidemia   . Aortic stenosis   . CAD (coronary artery disease)   . Inguinal hernia, right 05/15/2013    "they couldn't do OR"  . COPD (chronic obstructive pulmonary disease) 10/11/2011  . Coronary artery disease involving native coronary artery   . Coronary artery disease involving coronary bypass graft 05/10/2014  . Zenker's diverticulum 05/21/2014  . Chronic combined systolic and diastolic congestive heart failure   . Mitral regurgitation   . Anginal pain   . Shortness of breath dyspnea   . GERD (gastroesophageal reflux disease)   . 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 07/02/2014 TAVR: 23 mm Edwards Sapien 3 transcatheter heart valve placed via open right transfemoral approach;  b. 07/03/2014 Echo: EF 50-55%, no central or paravalvular leak, nl transaortic gradients, mod MR, mildly dil LA, mildly reduced RV fxn, PASP 46 mmHg.  Marland Kitchen Heart murmur    . Stroke 1965    denies residual on 07/30/2014  . Arthritis     "all over"  . Chronic neck pain   . Chronic pain of left knee   . Chronic kidney disease (CKD)   . Skin cancer     REMOVED L ELBOW  . NSTEMI (non-ST elevated myocardial infarction) 07/30/2014    Past Surgical History  Procedure Laterality Date  . Left and right heart catheterization with coronary/graft angiogram N/A 05/10/2014    Procedure: LEFT AND RIGHT HEART CATHETERIZATION WITH Beatrix Fetters;  Surgeon: Sherren Mocha, MD;  Location: [facility] CATH LAB;  Service: Cardiovascular;  Laterality: N/A;  . Tee without cardioversion N/A 05/17/2014    Procedure: TRANSESOPHAGEAL ECHOCARDIOGRAM (TEE);  Surgeon: Lelon Perla, MD;  Location: [facility] ENDOSCOPY;  Service: Cardiovascular;  Laterality: N/A;  . Transcatheter aortic valve replacement, transfemoral N/A 07/02/2014    Procedure: TRANSCATHETER AORTIC VALVE REPLACEMENT, TRANSFEMORAL;  Surgeon: Sherren Mocha, MD;  Location: [place];  Service: Open Heart Surgery;  Laterality: N/A;  . Tee without cardioversion N/A 07/02/2014    Procedure: TRANSESOPHAGEAL ECHOCARDIOGRAM (TEE);  Surgeon: Sherren Mocha, MD;  Location: [place];  Service: Open Heart Surgery;  Laterality: N/A;  . Cardiac valve replacement    . Cardiac catheterization      "I've had 2-3 cardiac caths"  . Cataract extraction      "? side; ? lens placed"  . Coronary artery bypass graft  01/06/1997    LIMA to LAD,  SVG to OM, open SVG harvest right lower leg - [facility]  . Skin cancer excision Left     "elbow"    Current Outpatient Prescriptions  Medication Sig Dispense Refill  . albuterol (PROVENTIL HFA;VENTOLIN HFA) 108 (90 BASE) MCG/ACT inhaler Inhale 1 puff into the lungs every 4 (four) hours as needed for shortness of breath.     . allopurinol (ZYLOPRIM) 100 MG tablet Take 1 tablet (100 mg total) by mouth daily. 90 tablet 3  . Ascorbic Acid (VITAMIN C PO) Take 1 tablet by mouth daily.    Marland Kitchen aspirin 81 MG  tablet Take 81 mg by mouth at bedtime.     Marland Kitchen atorvastatin (LIPITOR) 40 MG tablet Take 1 tablet (40 mg total) by mouth daily. 90 tablet 3  . budesonide-formoterol (SYMBICORT) 160-4.5 MCG/ACT inhaler Inhale 2 puffs into the lungs 2 (two) times daily. 1 Inhaler 0  . Cholecalciferol (VITAMIN D PO) Take 1 tablet by mouth daily.    . fluticasone (FLONASE) 50 MCG/ACT nasal spray One spray in each nostril twice a day, use left hand for right nostril, and right hand for left nostril. (Patient taking differently: Place 1 spray into both nostrils daily. One spray in each nostril twice a day, use left hand for right nostril, and right hand for left nostril.) 48 g 0  . furosemide (LASIX) 20 MG tablet Take 1 tablet (20 mg total) by mouth daily. 30 tablet 5  . isosorbide mononitrate (IMDUR) 30 MG 24 hr tablet Take 1 tablet (30 mg total) by mouth daily. 30 tablet 6  . metoprolol tartrate (LOPRESSOR) 25 MG tablet Take 0.5 tablets (12.5 mg total) by mouth 2 (two) times daily. 60 tablet 6  . mirtazapine (REMERON) 15 MG tablet Take 1 tablet (15 mg total) by mouth at bedtime. 30 tablet 3  . pantoprazole (PROTONIX) 40 MG tablet Take 1 tablet (40 mg total) by mouth daily. 30 tablet 6  . Tiotropium Bromide Monohydrate (SPIRIVA RESPIMAT) 2.5 MCG/ACT AERS Inhale 2.5 mg into the lungs every morning.     No current facility-administered medications for this visit.    Allergies:   Clopidogrel bisulfate; Beta adrenergic blockers; and Hydrocodone   Social History:  The patient  reports that he quit smoking about 51 years ago. His smoking use included Cigarettes. He has a 60 pack-year smoking history. He has never used smokeless tobacco. He reports that he drinks about 4.2 oz of alcohol per week. He reports that he does not use illicit drugs.   Family History:  The patient's  family history includes Heart attack in his brother and mother.    ROS:  Please see the history of present illness.  Otherwise, review of systems is  positive for hearing loss, cough, DOE, back pain, easy bruising, excessive fatigue.  All other systems are reviewed and negative.    PHYSICAL EXAM: VS:  BP 130/62 mmHg  Pulse 81  Ht 5\' 5"  (1.651 m)  Wt 130 lb (58.968 kg)  BMI 21.63 kg/m2 , BMI Body mass index is 21.63 kg/(m^2). GEN: Well nourished, well developed, pleasant elderly male in no acute distress HEENT: normal Neck: no JVD, no masses.  Cardiac: RRR with 2/6 SEM at the RUSB                Respiratory:  clear to auscultation bilaterally, normal work of breathing GI: soft, nontender, nondistended, + BS MS: no deformity or atrophy Ext: trace pretibial edema, pedal pulses 2+= bilaterally Skin: warm and  dry, no rash Neuro:  Strength and sensation are intact Psych: euthymic mood, full affect  EKG:  EKG is not ordered today.  Recent Labs: 09/11/2013: TSH 1.608 06/30/2014: ALT 20 07/03/2014: Magnesium 2.2 07/30/2014: B Natriuretic Peptide 1025.3* 08/02/2014: BUN 64*; Creatinine, Ser 1.78*; Hemoglobin 11.7*; Platelets 227; Potassium 4.0; Sodium 133*   Lipid Panel     Component Value Date/Time   CHOL 128 05/07/2014 0855   TRIG 80 05/07/2014 0855   HDL 60 05/07/2014 0855   CHOLHDL 2.1 05/07/2014 0855   VLDL 16 05/07/2014 0855   LDLCALC 52 05/07/2014 0855      Wt Readings from Last 3 Encounters:  08/30/14 130 lb (58.968 kg)  08/23/14 130 lb (58.968 kg)  08/13/14 128 lb (58.06 kg)     Cardiac Studies Reviewed: 2D Echo 7.6.2016: Study Conclusions  - Left ventricle: The cavity size was mildly dilated. Wall thickness was normal. Systolic function was mildly reduced. The estimated ejection fraction was in the range of 45% to 50%. - Aortic valve: 23 mm Sapien XT in good position trivial peri-valvular regurgitation. peak and mean gradients stable since 07/03/14. - Mitral valve: There was mild regurgitation. - Left atrium: The atrium was mildly to moderately dilated. - Atrial septum: No defect or patent foramen ovale  was identified.  ASSESSMENT AND PLAN: Aortic valve disease s/p TAVR: Now NYHA Class II symptoms of chronic systolic and diastolic heart failure noted, improved from baseline. Degree of MR has markedly improved. Echo reviewed and aortic prosthesis is functioning well with a mean gradient of 12 mmHg and only trivial AI. With 2 episodes of hemoptysis I agree it is best to avoid rechallenge with clopidogrel. Will continue on ASA alone. SBE prophylaxis reviewed with the patient. He will continue to follow with Dr Stanford Breed, scheduled to see him in 2 months. I will see back in [facility]ve Clinic in 1 year.    Current medicines are reviewed with the patient today.  The patient does not have concerns regarding medicines.  Labs/ tests ordered today include:  No orders of the defined types were placed in this encounter.    Disposition:   FU as above  Signed, Sherren Mocha, MD  08/30/2014 7:02 PM    [facility] [place], [place], [place]  [address] Phone: [phone]; Fax: [phone]

## 2014-08-31 ENCOUNTER — Telehealth: Payer: Self-pay

## 2014-08-31 DIAGNOSIS — Z951 Presence of aortocoronary bypass graft: Secondary | ICD-10-CM

## 2014-08-31 DIAGNOSIS — I2510 Atherosclerotic heart disease of native coronary artery without angina pectoris: Secondary | ICD-10-CM

## 2014-09-01 NOTE — Telephone Encounter (Signed)
Opened in Error. Eric Buchanan,CMA

## 2014-09-06 ENCOUNTER — Telehealth: Payer: Self-pay

## 2014-09-06 NOTE — Telephone Encounter (Signed)
[facility] CALLED REQUESTED A SCRIPT FOR SHOWER CHAIR AND FOR A LIFE ALERT WRIST BAN FOR SAFETY AT HOME. FAX SCRIPT TO [phone] ATTN. LUANNE. [name],CMA

## 2014-09-07 ENCOUNTER — Other Ambulatory Visit: Payer: Self-pay | Admitting: Sports Medicine

## 2014-09-07 ENCOUNTER — Ambulatory Visit (INDEPENDENT_AMBULATORY_CARE_PROVIDER_SITE_OTHER): Payer: Medicare Other | Admitting: Sports Medicine

## 2014-09-07 VITALS — BP 149/72 | HR 68 | Wt 115.0 lb

## 2014-09-07 DIAGNOSIS — N183 Chronic kidney disease, stage 3 unspecified: Secondary | ICD-10-CM

## 2014-09-07 DIAGNOSIS — L989 Disorder of the skin and subcutaneous tissue, unspecified: Secondary | ICD-10-CM | POA: Diagnosis not present

## 2014-09-07 DIAGNOSIS — I2510 Atherosclerotic heart disease of native coronary artery without angina pectoris: Secondary | ICD-10-CM | POA: Diagnosis not present

## 2014-09-07 DIAGNOSIS — Z951 Presence of aortocoronary bypass graft: Secondary | ICD-10-CM

## 2014-09-07 DIAGNOSIS — I5042 Chronic combined systolic (congestive) and diastolic (congestive) heart failure: Secondary | ICD-10-CM

## 2014-09-07 DIAGNOSIS — D367 Benign neoplasm of other specified sites: Secondary | ICD-10-CM | POA: Insufficient documentation

## 2014-09-07 DIAGNOSIS — M1712 Unilateral primary osteoarthritis, left knee: Secondary | ICD-10-CM

## 2014-09-07 MED ORDER — ATORVASTATIN CALCIUM 40 MG PO TABS: 40.0000 mg | ORAL_TABLET | Freq: Every day | ORAL | Status: DC

## 2014-09-07 MED ORDER — AMBULATORY NON FORMULARY MEDICATION: Status: DC

## 2014-09-07 NOTE — Assessment & Plan Note (Signed)
Surgical excision of a 2.1 cm left elbow lesion that appears to be a basal cell carcinoma. Return in one week for suture removal

## 2014-09-07 NOTE — Assessment & Plan Note (Signed)
Unfortunately continues to have persistent pain despite Orthovisc, at this point he is amenable to simply do oral medications, declines any form of surgical intervention

## 2014-09-07 NOTE — Telephone Encounter (Signed)
Waiting on Rx to be signed by provider, then i can fax to home health.Everlynn Sagun,CMA

## 2014-09-07 NOTE — Progress Notes (Signed)
  Subjective:    CC: Follow-up  HPI: Congestive heart failure: Symptoms have decreased to NYHA class II, he is post-transcutaneous aortic valve replacement, initially his principle issue regarding quality of life was intolerable nocturia due to his furosemide, we had him cut the furosemide dose in half, taking in the morning, and track his weight, he's actually lost weight, and his nocturia has resolved.  Bilateral knee osteoarthritis: Pain has now returned to baseline despite Orthovisc. He does not want any operative intervention.  Skin lesion: Left-sided, elbow, history of a squamous cell carcinoma excision from this area in the past with clear margins, the lesion has grown over the past couple of months.  Past medical history, Surgical history, Family history not pertinant except as noted below, Social history, Allergies, and medications have been entered into the medical record, reviewed, and no changes needed.   Review of Systems: No fevers, chills, night sweats, weight loss, chest pain, or shortness of breath.   Objective:    General: Well Developed, well nourished, and in no acute distress.  Neuro: Alert and oriented x3, extra-ocular muscles intact, sensation grossly intact.  HEENT: Normocephalic, atraumatic, pupils equal round reactive to light, neck supple, no masses, no lymphadenopathy, thyroid nonpalpable.  Skin: Warm and dry, no rashes. Cardiac: Regular rate and rhythm, no lower extremity edema, there is a 2/6 systolic murmur heard across the precordium. Respiratory: Clear to auscultation bilaterally. Not using accessory muscles, speaking in full sentences. Left elbow: There is a 2.1 cm dome shaped lesion that is nontender.  Procedure:  Excision of left elbow 2.1 cm possibly malignant lesion resembling a basal cell carcinoma Risks, benefits, and alternatives explained and consent obtained. Time out conducted. Surface prepped with alcohol. 5cc lidocaine with epinephine  infiltrated in a field block. Adequate anesthesia ensured. Area prepped and draped in a sterile fashion. Excision performed with: Using a #15 blade I made an elliptical incision around the lesion following langhans skin lines, using both sharp and blunt dissection I was able to remove the lesion down to the subcutaneous tissues, there did not appear to be any extension into the subcutaneous tissues. Lesion was fully removed and sent off for pathology analysis, I then placed several 5-0 Ethilon simple interrupted sutures to close the wound appropriately. Hemostasis achieved. Pt stable.  Impression and Recommendations:    I spent 25 minutes with this patient, greater than 50% was face-to-face time counseling regarding the above diagnoses, the above time was separate and unique from the time spent doing the above excision procedure

## 2014-09-07 NOTE — Telephone Encounter (Signed)
Rx in box. 

## 2014-09-07 NOTE — Assessment & Plan Note (Signed)
Overall continues to do well after aortic valve replacement, previously his main quality-of-life issue was his intolerable nocturia which has improved significantly with decreasing his Lasix dose, we have decreased his dose of Lasix and he returns today actually having lost weight, and feeling fairly well from a volume and pulmonary standpoint. He has recently seen invasive cardiology and has been cleared for a year. He is doing a half tab in the morning.

## 2014-09-07 NOTE — Assessment & Plan Note (Signed)
Recheck basic metabolic panel

## 2014-09-08 ENCOUNTER — Telehealth: Payer: Self-pay

## 2014-09-08 LAB — BASIC METABOLIC PANEL
BUN: 31 mg/dL — ABNORMAL HIGH (ref 7–25)
Creat: 1.43 mg/dL — ABNORMAL HIGH (ref 0.70–1.11)
Glucose, Bld: 96 mg/dL (ref 65–99)
Potassium: 4.5 mmol/L (ref 3.5–5.3)
Sodium: 138 mmol/L (ref 135–146)

## 2014-09-08 LAB — BASIC METABOLIC PANEL WITH GFR
CO2: 26 mmol/L (ref 20–31)
Calcium: 9.2 mg/dL (ref 8.6–10.3)
Chloride: 98 mmol/L (ref 98–110)

## 2014-09-08 MED ORDER — ATORVASTATIN CALCIUM 40 MG PO TABS: 40.0000 mg | ORAL_TABLET | Freq: Every day | ORAL | Status: DC

## 2014-09-08 NOTE — Telephone Encounter (Signed)
Patient request a 30 day supply Atorvastatin sent to [facility]. Eric Buchanan

## 2014-09-13 ENCOUNTER — Telehealth: Payer: Self-pay | Admitting: Sports Medicine

## 2014-09-13 NOTE — Telephone Encounter (Signed)
Noted, he has run bradycardic in the past, if he was asymptomatic then no further intervention needed. Please make sure he is only doing one half of a metoprolol tablet twice a day. He should not be doing a full tablet

## 2014-09-13 NOTE — Telephone Encounter (Signed)
Attempted to contact Pt regarding how he is taking his Metoprolol Rx. No answer. Left correct dosage instructions on voicemail and provided callback information for any further questions.

## 2014-09-13 NOTE — Telephone Encounter (Signed)
Hubbard Robinson, OT, called regarding an irregular reading she got this am on Pt Jiaire. BP: 114/62, Pulse: 48, O2: 93%.  Does not require a return call but her callback is: [phone] if needed.

## 2014-09-15 ENCOUNTER — Ambulatory Visit: Payer: Medicare Other | Admitting: Sports Medicine

## 2014-09-16 ENCOUNTER — Ambulatory Visit: Payer: Medicare Other | Admitting: Sports Medicine

## 2014-09-16 ENCOUNTER — Encounter: Payer: Self-pay | Admitting: Sports Medicine

## 2014-09-16 VITALS — BP 150/76 | HR 87 | Wt 133.0 lb

## 2014-09-16 DIAGNOSIS — D367 Benign neoplasm of other specified sites: Secondary | ICD-10-CM

## 2014-09-16 NOTE — Progress Notes (Signed)
  Subjective: Dermatopathology showed a simple ruptured epidermoid cyst, completely excised as a surgical cure. Eric Buchanan is doing very well, he is here for suture removal.  Objective: General: Well-developed, well-nourished, and in no acute distress. Left elbow: Incision is clean, dry, intact with simple interrupted sutures in place, these were removed, wound is healing well.  Assessment/plan:

## 2014-09-16 NOTE — Assessment & Plan Note (Signed)
Post surgical excision, sutures removed today. Return as needed.

## 2014-09-20 ENCOUNTER — Ambulatory Visit: Payer: Medicare Other | Admitting: Sports Medicine

## 2014-10-06 ENCOUNTER — Telehealth: Payer: Self-pay

## 2014-10-06 DIAGNOSIS — M5412 Radiculopathy, cervical region: Secondary | ICD-10-CM

## 2014-10-06 MED ORDER — OXYCODONE-ACETAMINOPHEN 5-325 MG PO TABS: 0.5000 | ORAL_TABLET | Freq: Three times a day (TID) | ORAL | Status: DC | PRN

## 2014-10-06 NOTE — Telephone Encounter (Signed)
rx in box 

## 2014-10-06 NOTE — Telephone Encounter (Signed)
Patient has been informed that Rx is ready for pickup. Nelta Caudill,CMA  

## 2014-10-06 NOTE — Telephone Encounter (Signed)
Patient request refill for Oxycodone. Eric Buchanan,CMA  

## 2014-10-26 NOTE — Progress Notes (Signed)
HPI: FU AVR. Abdominal ultrasound October 2013 showed no aneurysm. Echocardiogram February 2016 showed normal LV function and severe aortic stenosis. Cardiac catheterization April 2016 showed severe three-vessel coronary artery disease. The LIMA to the LAD was patent. Saphenous vein graft to the second marginal was also patent but there was note of a 75% stenosis in the mid graft. There was moderate pulmonary hypertension. CTA May 2016 showed tiny pulmonary nodules and follow-up recommended in 6-12 months. There is note of mild fusiform dilatation of the abdominal aorta measuring 2.5 x 2.4 cm. Patient underwent TAVR in May 2016. Patient admitted in June 2016 with acute diastolic CHF, NSTEMI and hemoptysis; recurrent admission at [facility] with hemoptysis and plavix DCed. Echo 7/16 showed EF 45-50, s/p AVR, trace AI, mild MR and mild to moderate LAE. Since last seen   Current Outpatient Prescriptions  Medication Sig Dispense Refill  . albuterol (PROVENTIL HFA;VENTOLIN HFA) 108 (90 BASE) MCG/ACT inhaler Inhale 1 puff into the lungs every 4 (four) hours as needed for shortness of breath.     . allopurinol (ZYLOPRIM) 100 MG tablet Take 1 tablet (100 mg total) by mouth daily. 90 tablet 3  . AMBULATORY NON FORMULARY MEDICATION Shower chair, wrist life alert bracelet 1 each 0  . aspirin 81 MG tablet Take 81 mg by mouth at bedtime.     Marland Kitchen atorvastatin (LIPITOR) 40 MG tablet Take 1 tablet (40 mg total) by mouth daily. 90 tablet 3  . budesonide-formoterol (SYMBICORT) 160-4.5 MCG/ACT inhaler Inhale 2 puffs into the lungs 2 (two) times daily. 1 Inhaler 0  . fluticasone (FLONASE) 50 MCG/ACT nasal spray One spray in each nostril twice a day, use left hand for right nostril, and right hand for left nostril. (Patient taking differently: Place 1 spray into both nostrils daily. One spray in each nostril twice a day, use left hand for right nostril, and right hand for left nostril.) 48 g 0  . furosemide (LASIX) 20 MG  tablet Take 1 tablet (20 mg total) by mouth daily. 30 tablet 5  . isosorbide mononitrate (IMDUR) 30 MG 24 hr tablet Take 1 tablet (30 mg total) by mouth daily. 30 tablet 6  . metoprolol tartrate (LOPRESSOR) 25 MG tablet Take 0.5 tablets (12.5 mg total) by mouth 2 (two) times daily. 60 tablet 6  . mirtazapine (REMERON) 15 MG tablet Take 1 tablet (15 mg total) by mouth at bedtime. 30 tablet 3  . oxyCODONE-acetaminophen (PERCOCET/ROXICET) 5-325 MG per tablet Take 0.5 tablets by mouth every 8 (eight) hours as needed. 45 tablet 0  . Tiotropium Bromide Monohydrate (SPIRIVA RESPIMAT) 2.5 MCG/ACT AERS Inhale 2.5 mg into the lungs every morning.     No current facility-administered medications for this visit.     Past Medical History  Diagnosis Date  . Hypertension   . Asthma   . Gout   . Hyperlipidemia   . Aortic stenosis   . CAD (coronary artery disease)   . Inguinal hernia, right 05/15/2013    "they couldn't do OR"  . COPD (chronic obstructive pulmonary disease) 10/11/2011  . Coronary artery disease involving native coronary artery   . Coronary artery disease involving coronary bypass graft 05/10/2014  . Zenker's diverticulum 05/21/2014  . Chronic combined systolic and diastolic congestive heart failure   . Mitral regurgitation   . Anginal pain   . Shortness of breath dyspnea   . GERD (gastroesophageal reflux disease)   . S/P TAVR (transcatheter aortic valve replacement) 07/02/2014  a. 07/02/2014 TAVR: 23 mm Edwards Sapien 3 transcatheter heart valve placed via open right transfemoral approach;  b. 07/03/2014 Echo: EF 50-55%, no central or paravalvular leak, nl transaortic gradients, mod MR, mildly dil LA, mildly reduced RV fxn, PASP 46 mmHg.  Marland Kitchen Heart murmur   . Stroke 1965    denies residual on 07/30/2014  . Arthritis     "all over"  . Chronic neck pain   . Chronic pain of left knee   . Chronic kidney disease (CKD)   . Skin cancer     REMOVED L ELBOW  . NSTEMI (non-ST elevated myocardial  infarction) 07/30/2014    Past Surgical History  Procedure Laterality Date  . Left and right heart catheterization with coronary/graft angiogram N/A 05/10/2014    Procedure: LEFT AND RIGHT HEART CATHETERIZATION WITH Beatrix Fetters;  Surgeon: Sherren Mocha, MD;  Location: [facility] CATH LAB;  Service: Cardiovascular;  Laterality: N/A;  . Tee without cardioversion N/A 05/17/2014    Procedure: TRANSESOPHAGEAL ECHOCARDIOGRAM (TEE);  Surgeon: Lelon Perla, MD;  Location[facility] ENDOSCOPY;  Service: Cardiovascular;  Laterality: N/A;  . Transcatheter aortic valve replacement, transfemoral N/A 07/02/2014    Procedure: TRANSCATHETER AORTIC VALVE REPLACEMENT, TRANSFEMORAL;  Surgeon: Sherren Mocha, MD;  Location: [place];  Service: Open Heart Surgery;  Laterality: N/A;  . Tee without cardioversion N/A 07/02/2014    Procedure: TRANSESOPHAGEAL ECHOCARDIOGRAM (TEE);  Surgeon: Sherren Mocha, MD;  Location: [place];  Service: Open Heart Surgery;  Laterality: N/A;  . Cardiac valve replacement    . Cardiac catheterization      "I've had 2-3 cardiac caths"  . Cataract extraction      "? side; ? lens placed"  . Coronary artery bypass graft  01/06/1997    LIMA to LAD, SVG to OM, open SVG harvest right lower leg - [facility]  . Skin cancer excision Left     "elbow"    Social History   Social History  . Marital Status: Widowed    Spouse Name: N/A  . Number of Children: 3  . Years of Education: N/A   Occupational History  . Not on file.   Social History Main Topics  . Smoking status: Former Smoker -- 3.00 packs/day for 20 years    Types: Cigarettes    Quit date: 02/06/1963  . Smokeless tobacco: Never Used  . Alcohol Use: 4.2 oz/week    0 Standard drinks or equivalent, 7 Cans of beer per week  . Drug Use: No  . Sexual Activity: Not Currently   Other Topics Concern  . Not on file   Social History Narrative    ROS: no fevers or chills, productive cough, hemoptysis, dysphasia,  odynophagia, melena, hematochezia, dysuria, hematuria, rash, seizure activity, orthopnea, PND, pedal edema, claudication. Remaining systems are negative.  Physical Exam: Well-developed well-nourished in no acute distress.  Skin is warm and dry.  HEENT is normal.  Neck is supple.  Chest is clear to auscultation with normal expansion.  Cardiovascular exam is regular rate and rhythm.  Abdominal exam nontender or distended. No masses palpated. Extremities show no edema. neuro grossly intact  ECG     This encounter was created in error - please disregard.

## 2014-10-27 ENCOUNTER — Ambulatory Visit (INDEPENDENT_AMBULATORY_CARE_PROVIDER_SITE_OTHER): Payer: Medicare Other | Admitting: Cardiology

## 2014-10-27 ENCOUNTER — Encounter: Payer: Medicare Other | Admitting: Cardiology

## 2014-10-27 ENCOUNTER — Encounter: Payer: Self-pay | Admitting: Cardiology

## 2014-10-27 VITALS — BP 125/69 | HR 62 | Ht 65.0 in | Wt 138.0 lb

## 2014-10-27 DIAGNOSIS — R072 Precordial pain: Secondary | ICD-10-CM | POA: Diagnosis not present

## 2014-10-27 DIAGNOSIS — R079 Chest pain, unspecified: Secondary | ICD-10-CM | POA: Insufficient documentation

## 2014-10-27 DIAGNOSIS — I5042 Chronic combined systolic (congestive) and diastolic (congestive) heart failure: Secondary | ICD-10-CM

## 2014-10-27 DIAGNOSIS — I2510 Atherosclerotic heart disease of native coronary artery without angina pectoris: Secondary | ICD-10-CM

## 2014-10-27 DIAGNOSIS — R0600 Dyspnea, unspecified: Secondary | ICD-10-CM | POA: Diagnosis not present

## 2014-10-27 MED ORDER — ISOSORBIDE MONONITRATE ER 30 MG PO TB24: 60.0000 mg | ORAL_TABLET | Freq: Every day | ORAL | Status: DC

## 2014-10-27 MED ORDER — FUROSEMIDE 20 MG PO TABS: 40.0000 mg | ORAL_TABLET | Freq: Every day | ORAL | Status: DC

## 2014-10-27 NOTE — Patient Instructions (Signed)
Your physician recommends that you schedule a follow-up appointment in: [place] TO 40 MG ONCE DAILY= 2 OF THE 20 MG TABLETS ONCE DAILY  INCREASE ISOSORBIDE TO 60 MG ONCE DAILY= 2 OF THE 30 MG TABLETS ONCE DAILY  Your physician has requested that you have an echocardiogram. Echocardiography is a painless test that uses sound waves to create images of your heart. It provides your doctor with information about the size and shape of your heart and how well your heart's chambers and valves are working. This procedure takes approximately one hour. There are no restrictions for this procedure.   Your physician recommends that you return for lab work in: [place]

## 2014-10-27 NOTE — Assessment & Plan Note (Signed)
Continue aspirin and statin. 

## 2014-10-27 NOTE — Progress Notes (Signed)
HPI: FU AVR. Abdominal ultrasound October 2013 showed no aneurysm. Echocardiogram February 2016 showed normal LV function and severe aortic stenosis. Cardiac catheterization April 2016 showed severe three-vessel coronary artery disease. The LIMA to the LAD was patent. Saphenous vein graft to the second marginal was also patent but there was note of a 75% stenosis in the mid graft. There was moderate pulmonary hypertension. CTA May 2016 showed tiny pulmonary nodules and follow-up recommended in 6-12 months. There is note of mild fusiform dilatation of the abdominal aorta measuring 2.5 x 2.4 cm. Patient underwent TAVR in May 2016. Patient admitted in June 2016 with acute diastolic CHF, NSTEMI and hemoptysis; recurrent admission at [facility] with hemoptysis and plavix DCed. Echo 7/16 showed EF 45-50, s/p AVR, trace AI, mild MR and mild to moderate LAE. Since last seen patient notices increased dyspnea on exertion. No orthopnea or PND. Mild pedal edema. Occasional chest heaviness that he attributes to indigestion. Last 5-10 minutes and resolves spontaneously. Not exertional, pleuritic, positional or related to food.  Current Outpatient Prescriptions  Medication Sig Dispense Refill  . albuterol (PROVENTIL HFA;VENTOLIN HFA) 108 (90 BASE) MCG/ACT inhaler Inhale 1 puff into the lungs every 4 (four) hours as needed for shortness of breath.     . allopurinol (ZYLOPRIM) 100 MG tablet Take 1 tablet (100 mg total) by mouth daily. 90 tablet 3  . AMBULATORY NON FORMULARY MEDICATION Shower chair, wrist life alert bracelet 1 each 0  . aspirin 81 MG tablet Take 81 mg by mouth at bedtime.     Marland Kitchen atorvastatin (LIPITOR) 40 MG tablet Take 1 tablet (40 mg total) by mouth daily. 90 tablet 3  . budesonide-formoterol (SYMBICORT) 160-4.5 MCG/ACT inhaler Inhale 2 puffs into the lungs 2 (two) times daily. 1 Inhaler 0  . fluticasone (FLONASE) 50 MCG/ACT nasal spray One spray in each nostril twice a day, use left hand for right  nostril, and right hand for left nostril. (Patient taking differently: Place 1 spray into both nostrils daily. One spray in each nostril twice a day, use left hand for right nostril, and right hand for left nostril.) 48 g 0  . furosemide (LASIX) 20 MG tablet Take 1 tablet (20 mg total) by mouth daily. 30 tablet 5  . isosorbide mononitrate (IMDUR) 30 MG 24 hr tablet Take 1 tablet (30 mg total) by mouth daily. 30 tablet 6  . metoprolol tartrate (LOPRESSOR) 25 MG tablet Take 0.5 tablets (12.5 mg total) by mouth 2 (two) times daily. 60 tablet 6  . mirtazapine (REMERON) 15 MG tablet Take 1 tablet (15 mg total) by mouth at bedtime. 30 tablet 3  . oxyCODONE-acetaminophen (PERCOCET/ROXICET) 5-325 MG per tablet Take 0.5 tablets by mouth every 8 (eight) hours as needed. 45 tablet 0  . Tiotropium Bromide Monohydrate (SPIRIVA RESPIMAT) 2.5 MCG/ACT AERS Inhale 2.5 mg into the lungs every morning.     No current facility-administered medications for this visit.     Past Medical History  Diagnosis Date  . Hypertension   . Asthma   . Gout   . Hyperlipidemia   . Aortic stenosis   . CAD (coronary artery disease)   . Inguinal hernia, right 05/15/2013    "they couldn't do OR"  . COPD (chronic obstructive pulmonary disease) 10/11/2011  . Coronary artery disease involving native coronary artery   . Coronary artery disease involving coronary bypass graft 05/10/2014  . Zenker's diverticulum 05/21/2014  . Chronic combined systolic and diastolic congestive heart failure   .  Mitral regurgitation   . Anginal pain   . Shortness of breath dyspnea   . GERD (gastroesophageal reflux disease)   . 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 07/02/2014 TAVR: 23 mm Edwards Sapien 3 transcatheter heart valve placed via open right transfemoral approach;  b. 07/03/2014 Echo: EF 50-55%, no central or paravalvular leak, nl transaortic gradients, mod MR, mildly dil LA, mildly reduced RV fxn, PASP 46 mmHg.  Marland Kitchen  Heart murmur   . Stroke 1965    denies residual on 07/30/2014  . Arthritis     "all over"  . Chronic neck pain   . Chronic pain of left knee   . Chronic kidney disease (CKD)   . Skin cancer     REMOVED L ELBOW  . NSTEMI (non-ST elevated myocardial infarction) 07/30/2014    Past Surgical History  Procedure Laterality Date  . Left and right heart catheterization with coronary/graft angiogram N/A 05/10/2014    Procedure: LEFT AND RIGHT HEART CATHETERIZATION WITH Beatrix Fetters;  Surgeon: Sherren Mocha, MD;  Location: [facility] CATH LAB;  Service: Cardiovascular;  Laterality: N/A;  . Tee without cardioversion N/A 05/17/2014    Procedure: TRANSESOPHAGEAL ECHOCARDIOGRAM (TEE);  Surgeon: Lelon Perla, MD;  Location: [facility] ENDOSCOPY;  Service: Cardiovascular;  Laterality: N/A;  . Transcatheter aortic valve replacement, transfemoral N/A 07/02/2014    Procedure: TRANSCATHETER AORTIC VALVE REPLACEMENT, TRANSFEMORAL;  Surgeon: Sherren Mocha, MD;  Location: [place];  Service: Open Heart Surgery;  Laterality: N/A;  . Tee without cardioversion N/A 07/02/2014    Procedure: TRANSESOPHAGEAL ECHOCARDIOGRAM (TEE);  Surgeon: Sherren Mocha, MD;  Location: [place];  Service: Open Heart Surgery;  Laterality: N/A;  . Cardiac valve replacement    . Cardiac catheterization      "I've had 2-3 cardiac caths"  . Cataract extraction      "? side; ? lens placed"  . Coronary artery bypass graft  01/06/1997    LIMA to LAD, SVG to OM, open SVG harvest right lower leg - [facility]  . Skin cancer excision Left     "elbow"    Social History   Social History  . Marital Status: Widowed    Spouse Name: N/A  . Number of Children: 3  . Years of Education: N/A   Occupational History  . Not on file.   Social History Main Topics  . Smoking status: Former Smoker -- 3.00 packs/day for 20 years    Types: Cigarettes    Quit date: 02/06/1963  . Smokeless tobacco: Never Used  . Alcohol Use: 4.2 oz/week    0  Standard drinks or equivalent, 7 Cans of beer per week  . Drug Use: No  . Sexual Activity: Not Currently   Other Topics Concern  . Not on file   Social History Narrative    ROS: no fevers or chills, productive cough, hemoptysis, dysphasia, odynophagia, melena, hematochezia, dysuria, hematuria, rash, seizure activity, orthopnea, PND, pedal edema, claudication. Remaining systems are negative.  Physical Exam: Well-developed well-nourished in no acute distress.  Skin is warm and dry.  HEENT is normal.  Neck is supple.  Chest is clear to auscultation with normal expansion.  Cardiovascular exam is regular rate and rhythm. 2/6 systolic and diastolic murmur left sternal border. Abdominal exam nontender or distended. No masses palpated. Extremities show 1+ ankle edema. neuro grossly intact  ECG sinus rhythm with occasional PVCs and PACs. Left ventricular hypertrophy with repolarization abnormality.

## 2014-10-27 NOTE — Assessment & Plan Note (Signed)
Patient notices increased dyspnea and is mildly volume overloaded on examination. There is a 2/6 diastolic murmur not present previously. Question if he has developed aortic insufficiency following recent TAVR. Repeat echocardiogram.

## 2014-10-27 NOTE — Assessment & Plan Note (Signed)
Continue statin. 

## 2014-10-27 NOTE — Assessment & Plan Note (Signed)
Blood pressure controlled. Continue present medications. 

## 2014-10-27 NOTE — Assessment & Plan Note (Signed)
Patient is mildly volume overloaded on examination. Increase Lasix to 40 mg daily. Check potassium and renal function as well as BNP in 1 week.

## 2014-10-27 NOTE — Assessment & Plan Note (Signed)
Patient is having occasional vague chest pain not related to exertion. Electrocardiogram shows no acute ST changes. Previous catheterization is outlined in history of present illness and revascularization options were limited because of distal disease. Increase Imdur to 60 mg daily. Follow up in 8 weeks.

## 2014-10-28 ENCOUNTER — Ambulatory Visit (INDEPENDENT_AMBULATORY_CARE_PROVIDER_SITE_OTHER): Payer: Medicare Other | Admitting: Sports Medicine

## 2014-10-28 ENCOUNTER — Encounter: Payer: Self-pay | Admitting: Sports Medicine

## 2014-10-28 VITALS — BP 143/78 | HR 53 | Wt 138.0 lb

## 2014-10-28 DIAGNOSIS — Z23 Encounter for immunization: Secondary | ICD-10-CM

## 2014-10-28 DIAGNOSIS — I2510 Atherosclerotic heart disease of native coronary artery without angina pectoris: Secondary | ICD-10-CM

## 2014-10-28 DIAGNOSIS — M1712 Unilateral primary osteoarthritis, left knee: Secondary | ICD-10-CM

## 2014-10-28 DIAGNOSIS — D649 Anemia, unspecified: Secondary | ICD-10-CM | POA: Insufficient documentation

## 2014-10-28 DIAGNOSIS — I5042 Chronic combined systolic (congestive) and diastolic (congestive) heart failure: Secondary | ICD-10-CM

## 2014-10-28 NOTE — Assessment & Plan Note (Signed)
Aspiration and injection.

## 2014-10-28 NOTE — Assessment & Plan Note (Signed)
Rechecking CBC, TSH, anemia panel, reticulocyte count.

## 2014-10-28 NOTE — Progress Notes (Signed)
  Subjective:    CC:  Follow-up  HPI:  K[place]ston returns , He is a pleasant 81  -year-old male post percutaneous aortic valve replacement, overall he did well but lately has had some signs of volume overload,  His furosemide dose was increased yesterday, and not surprisingly he feels about same.  Left knee swelling: Previous injection was many months ago, patient is amenable to a repeat aspiration and injection today. Symptoms are moderate, persistent without radiation.  Past medical history, Surgical history, Family history not pertinant except as noted below, Social history, Allergies, and medications have been entered into the medical record, reviewed, and no changes needed.   Review of Systems: No fevers, chills, night sweats, weight loss, chest pain, or shortness of breath.   Objective:    General: Well Developed, well nourished, and in no acute distress.  Neuro: Alert and oriented x3, extra-ocular muscles intact, sensation grossly intact.  HEENT: Normocephalic, atraumatic, pupils equal round reactive to light, neck supple, no masses, no lymphadenopathy, thyroid nonpalpable.  Skin: Warm and dry, no rashes. Cardiac: Regular rate and rhythm, no murmurs rubs or gallops, trace lower extremity edema Respiratory: Overall clear with bibasilar crackles, otherwise clear to auscultation bilaterally. Not using accessory muscles, speaking in full sentences.  Procedure: Real-time Ultrasound Guided Injection of left knee Device: GE Logiq E  Verbal informed consent obtained.  Time-out conducted.  Noted no overlying erythema, induration, or other signs of local infection.  Skin prepped in a sterile fashion.  Local anesthesia: Topical Ethyl chloride.  With sterile technique and under real time ultrasound guidance:  Aspirated 20 mL straw-colored fluid, syringe switched and 1 mL kenalog 40, 2 mL lidocaine, 2 mL Marcaine injected easily into the super patellar recess. Completed without difficulty  Pain  immediately resolved suggesting accurate placement of the medication.  Advised to call if fevers/chills, erythema, induration, drainage, or persistent bleeding.  Images permanently stored and available for review in the ultrasound unit.  Impression: Technically successful ultrasound guided injection.  Impression and Recommendations:

## 2014-10-28 NOTE — Assessment & Plan Note (Signed)
Minimally volume overloaded on exam with bibasilar crackles. He does have an echocardiogram coming up. Has only had one day of Lasix 40.

## 2014-11-03 ENCOUNTER — Ambulatory Visit (HOSPITAL_BASED_OUTPATIENT_CLINIC_OR_DEPARTMENT_OTHER)
Admission: RE | Admit: 2014-11-03 | Discharge: 2014-11-03 | Disposition: A | Discharge status: Home / Self Care | Payer: Medicare Other | Source: Ambulatory Visit | Attending: Cardiology | Admitting: Cardiology

## 2014-11-03 DIAGNOSIS — R0600 Dyspnea, unspecified: Secondary | ICD-10-CM

## 2014-11-03 DIAGNOSIS — Z952 Presence of prosthetic heart valve: Secondary | ICD-10-CM | POA: Diagnosis not present

## 2014-11-03 DIAGNOSIS — I517 Cardiomegaly: Secondary | ICD-10-CM | POA: Diagnosis not present

## 2014-11-03 DIAGNOSIS — I351 Nonrheumatic aortic (valve) insufficiency: Secondary | ICD-10-CM | POA: Insufficient documentation

## 2014-11-03 DIAGNOSIS — Z87891 Personal history of nicotine dependence: Secondary | ICD-10-CM | POA: Diagnosis not present

## 2014-11-03 DIAGNOSIS — E785 Hyperlipidemia, unspecified: Secondary | ICD-10-CM | POA: Insufficient documentation

## 2014-11-03 DIAGNOSIS — I10 Essential (primary) hypertension: Secondary | ICD-10-CM | POA: Diagnosis not present

## 2014-11-03 DIAGNOSIS — Z951 Presence of aortocoronary bypass graft: Secondary | ICD-10-CM | POA: Insufficient documentation

## 2014-11-03 DIAGNOSIS — I359 Nonrheumatic aortic valve disorder, unspecified: Secondary | ICD-10-CM | POA: Diagnosis present

## 2014-11-03 DIAGNOSIS — I340 Nonrheumatic mitral (valve) insufficiency: Secondary | ICD-10-CM | POA: Insufficient documentation

## 2014-11-03 DIAGNOSIS — I5189 Other ill-defined heart diseases: Secondary | ICD-10-CM | POA: Insufficient documentation

## 2014-11-03 DIAGNOSIS — I071 Rheumatic tricuspid insufficiency: Secondary | ICD-10-CM | POA: Insufficient documentation

## 2014-11-03 LAB — CBC
HCT: 38.5 % — ABNORMAL LOW (ref 39.0–52.0)
Hemoglobin: 13 g/dL (ref 13.0–17.0)
MCH: 31.3 pg (ref 26.0–34.0)
MCHC: 33.8 g/dL (ref 30.0–36.0)
MCV: 92.5 fL (ref 78.0–100.0)
MPV: 10.8 fL (ref 8.6–12.4)
Platelets: 233 10*3/uL (ref 150–400)
RBC: 4.16 MIL/uL — ABNORMAL LOW (ref 4.22–5.81)
RDW: 15 % (ref 11.5–15.5)
WBC: 7.6 10*3/uL (ref 4.0–10.5)

## 2014-11-03 LAB — RETICULOCYTES
ABS Retic: 41.6 10*3/uL (ref 19.0–186.0)
RBC.: 4.16 MIL/uL — ABNORMAL LOW (ref 4.22–5.81)
Retic Ct Pct: 1 % (ref 0.4–2.3)

## 2014-11-03 NOTE — Progress Notes (Signed)
*  PRELIMINARY RESULTS* Echocardiogram 2D Echocardiogram has been performed.  Eric Buchanan 11/03/2014, 12:49 PM

## 2014-11-04 LAB — FERRITIN: Ferritin: 219 ng/mL (ref 22–322)

## 2014-11-04 LAB — IRON AND TIBC
%SAT: 28 % (ref 15–60)
Iron: 93 ug/dL (ref 50–180)
TIBC: 336 ug/dL (ref 250–425)
UIBC: 243 ug/dL (ref 125–400)

## 2014-11-04 LAB — BASIC METABOLIC PANEL
BUN: 37 mg/dL — ABNORMAL HIGH (ref 7–25)
CALCIUM: 9.5 mg/dL (ref 8.6–10.3)
CO2: 29 mmol/L (ref 20–31)
Chloride: 102 mmol/L (ref 98–110)
Creat: 1.26 mg/dL — ABNORMAL HIGH (ref 0.70–1.11)
GLUCOSE: 89 mg/dL (ref 65–99)
POTASSIUM: 3.9 mmol/L (ref 3.5–5.3)
Sodium: 139 mmol/L (ref 135–146)

## 2014-11-04 LAB — BRAIN NATRIURETIC PEPTIDE: Brain Natriuretic Peptide: 190.9 pg/mL — ABNORMAL HIGH (ref 0.0–100.0)

## 2014-11-04 LAB — FOLATE: Folate: 9.1 ng/mL

## 2014-11-04 LAB — VITAMIN B12: Vitamin B-12: 248 pg/mL (ref 211–911)

## 2014-11-04 LAB — TSH: TSH: 1.581 u[IU]/mL (ref 0.350–4.500)

## 2014-11-08 ENCOUNTER — Telehealth: Payer: Self-pay

## 2014-11-08 DIAGNOSIS — R072 Precordial pain: Secondary | ICD-10-CM

## 2014-11-08 DIAGNOSIS — M5412 Radiculopathy, cervical region: Secondary | ICD-10-CM

## 2014-11-08 MED ORDER — OXYCODONE-ACETAMINOPHEN 5-325 MG PO TABS: 0.5000 | ORAL_TABLET | Freq: Three times a day (TID) | ORAL | Status: DC | PRN

## 2014-11-08 MED ORDER — ISOSORBIDE MONONITRATE ER 60 MG PO TB24: 60.0000 mg | ORAL_TABLET | Freq: Every day | ORAL | Status: DC

## 2014-11-08 NOTE — Telephone Encounter (Signed)
Refilled medication, he was taking 2 pills of the 30 mg isosorbide, I simply switch this to 60 mg isosorbide so he only needs to take one per day

## 2014-11-08 NOTE — Telephone Encounter (Signed)
Patient request refill for Oxycodone 5-325 mg and Isosorbide 30 mg. Patient stated that Cardiologist doubled his dose of the Isosorbide.[name],CMA

## 2014-11-09 ENCOUNTER — Other Ambulatory Visit: Payer: Self-pay | Admitting: Sports Medicine

## 2014-11-09 NOTE — Telephone Encounter (Signed)
PATIENT HAS BEEN INFORMED. [name],CMA  

## 2014-11-29 ENCOUNTER — Ambulatory Visit (INDEPENDENT_AMBULATORY_CARE_PROVIDER_SITE_OTHER): Payer: Medicare Other | Admitting: Sports Medicine

## 2014-11-29 ENCOUNTER — Encounter: Payer: Self-pay | Admitting: Sports Medicine

## 2014-11-29 VITALS — BP 147/67 | HR 70 | Wt 140.0 lb

## 2014-11-29 DIAGNOSIS — M1712 Unilateral primary osteoarthritis, left knee: Secondary | ICD-10-CM | POA: Diagnosis not present

## 2014-11-29 DIAGNOSIS — I2510 Atherosclerotic heart disease of native coronary artery without angina pectoris: Secondary | ICD-10-CM

## 2014-11-29 NOTE — Progress Notes (Signed)
  Subjective:    CC: follow-up  HPI: Left knee osteoarthritis: recurrence of swelling, desires aspiration. Symptoms are moderate, persistent without radiation, no mechanical symptoms, no trauma.  Past medical history, Surgical history, Family history not pertinant except as noted below, Social history, Allergies, and medications have been entered into the medical record, reviewed, and no changes needed.   Review of Systems: No fevers, chills, night sweats, weight loss, chest pain, or shortness of breath.   Objective:    General: Well Developed, well nourished, and in no acute distress.  Neuro: Alert and oriented x3, extra-ocular muscles intact, sensation grossly intact.  HEENT: Normocephalic, atraumatic, pupils equal round reactive to light, neck supple, no masses, no lymphadenopathy, thyroid nonpalpable.  Skin: Warm and dry, no rashes. Cardiac: Regular rate and rhythm, no murmurs rubs or gallops, no lower extremity edema.  Respiratory: Clear to auscultation bilaterally. Not using accessory muscles, speaking in full sentences. Left Knee: Visibly swollen with a palpable fluid wave and effusion ROM normal in flexion and extension and lower leg rotation. Ligaments with solid consistent endpoints including ACL, PCL, LCL, MCL. Negative Mcmurray's and provocative meniscal tests. Non painful patellar compression. Patellar and quadriceps tendons unremarkable. Hamstring and quadriceps strength is normal.  Procedure: Real-time Ultrasound Guided aspiration/Injection of left knee Device: GE Logiq E  Verbal informed consent obtained.  Time-out conducted.  Noted no overlying erythema, induration, or other signs of local infection.  Skin prepped in a sterile fashion.  Local anesthesia: Topical Ethyl chloride.  With sterile technique and under real time ultrasound guidance:  Aspirated 35 mL straw-colored fluid, syringe switched and 3 mL Marcaine, 3 mL lidocaine injected easily. Completed without  difficulty  Pain immediately resolved suggesting accurate placement of the medication.  Advised to call if fevers/chills, erythema, induration, drainage, or persistent bleeding.  Images permanently stored and available for review in the ultrasound unit.  Impression: Technically successful ultrasound guided injection.  Impression and Recommendations:

## 2014-11-29 NOTE — Assessment & Plan Note (Signed)
35 mL aspiration with lidocaine and Marcaine injection. Return as needed.

## 2014-11-29 NOTE — Patient Instructions (Signed)
[phone] [employer]

## 2014-12-15 NOTE — Progress Notes (Signed)
HPI: FU AVR. Abdominal ultrasound October 2013 showed no aneurysm. Echocardiogram February 2016 showed normal LV function and severe aortic stenosis. Cardiac catheterization April 2016 showed severe three-vessel coronary artery disease. The LIMA to the LAD was patent. Saphenous vein graft to the second marginal was also patent but there was note of a 75% stenosis in the mid graft. There was moderate pulmonary hypertension. CTA May 2016 showed tiny pulmonary nodules and follow-up recommended in 6-12 months. There is note of mild fusiform dilatation of the abdominal aorta measuring 2.5 x 2.4 cm. Patient underwent TAVR in May 2016. Patient admitted in June 2016 with acute diastolic CHF, NSTEMI and hemoptysis; recurrent admission at [facility] with hemoptysis and plavix DCed. Carotid dopplers 8/16 with no significant stenosis. Last echo 9/16 showed normal LV function, grade 2 diastolic dysfunction, TAVR with mild AI, mean gradient 20 mmHg, mild MR, mild LAE, mildly elevated pulmonary pressure. Since last seen He does have dyspnea on exertion but no orthopnea, PND, pedal edema, chest pain, palpitations or syncope.  Current Outpatient Prescriptions  Medication Sig Dispense Refill  . albuterol (PROVENTIL HFA;VENTOLIN HFA) 108 (90 BASE) MCG/ACT inhaler Inhale 1 puff into the lungs every 4 (four) hours as needed for shortness of breath.     . allopurinol (ZYLOPRIM) 100 MG tablet Take 1 tablet (100 mg total) by mouth daily. 90 tablet 3  . AMBULATORY NON FORMULARY MEDICATION Shower chair, wrist life alert bracelet 1 each 0  . aspirin 81 MG tablet Take 81 mg by mouth at bedtime.     Marland Kitchen atorvastatin (LIPITOR) 40 MG tablet Take 1 tablet (40 mg total) by mouth daily. 90 tablet 3  . budesonide-formoterol (SYMBICORT) 160-4.5 MCG/ACT inhaler Inhale 2 puffs into the lungs 2 (two) times daily. 1 Inhaler 0  . fluticasone (FLONASE) 50 MCG/ACT nasal spray One spray in each nostril twice a day, use left hand for right  nostril, and right hand for left nostril. (Patient taking differently: Place 1 spray into both nostrils daily. One spray in each nostril twice a day, use left hand for right nostril, and right hand for left nostril.) 48 g 0  . furosemide (LASIX) 20 MG tablet Take 2 tablets (40 mg total) by mouth daily. 60 tablet 12  . isosorbide mononitrate (IMDUR) 60 MG 24 hr tablet Take 1 tablet (60 mg total) by mouth daily. 30 tablet 11  . metoprolol tartrate (LOPRESSOR) 25 MG tablet Take 0.5 tablets (12.5 mg total) by mouth 2 (two) times daily. 60 tablet 6  . mirtazapine (REMERON) 15 MG tablet TAKE 1 TABLET (15 MG TOTAL) BY MOUTH AT BEDTIME. 30 tablet 3  . oxyCODONE-acetaminophen (PERCOCET/ROXICET) 5-325 MG tablet Take 0.5 tablets by mouth every 8 (eight) hours as needed. 45 tablet 0  . Tiotropium Bromide Monohydrate (SPIRIVA RESPIMAT) 2.5 MCG/ACT AERS Inhale 2.5 mg into the lungs every morning.     No current facility-administered medications for this visit.     Past Medical History  Diagnosis Date  . Hypertension   . Asthma   . Gout   . Hyperlipidemia   . Aortic stenosis   . CAD (coronary artery disease)   . Inguinal hernia, right 05/15/2013    "they couldn't do OR"  . COPD (chronic obstructive pulmonary disease) ([place]) 10/11/2011  . Coronary artery disease involving native coronary artery   . Coronary artery disease involving coronary bypass graft 05/10/2014  . Zenker's diverticulum 05/21/2014  . Chronic combined systolic and diastolic congestive heart failure ([place])   .  Mitral regurgitation   . Anginal pain ([place])   . Shortness of breath dyspnea   . GERD (gastroesophageal reflux disease)   . 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 07/02/2014 TAVR: 23 mm Edwards Sapien 3 transcatheter heart valve placed via open right transfemoral approach;  b. 07/03/2014 Echo: EF 50-55%, no central or paravalvular leak, nl transaortic gradients, mod MR, mildly dil LA, mildly reduced RV fxn, PASP  46 mmHg.  Marland Kitchen Heart murmur   . Stroke [facility]) 1965    denies residual on 07/30/2014  . Arthritis     "all over"  . Chronic neck pain   . Chronic pain of left knee   . Chronic kidney disease (CKD)   . Skin cancer     REMOVED L ELBOW  . NSTEMI (non-ST elevated myocardial infarction) ([place]) 07/30/2014    Past Surgical History  Procedure Laterality Date  . Left and right heart catheterization with coronary/graft angiogram N/A 05/10/2014    Procedure: LEFT AND RIGHT HEART CATHETERIZATION WITH Beatrix Fetters;  Surgeon: Sherren Mocha, MD;  Location: [facility] CATH LAB;  Service: Cardiovascular;  Laterality: N/A;  . Tee without cardioversion N/A 05/17/2014    Procedure: TRANSESOPHAGEAL ECHOCARDIOGRAM (TEE);  Surgeon: Lelon Perla, MD;  Location: [facility] ENDOSCOPY;  Service: Cardiovascular;  Laterality: N/A;  . Transcatheter aortic valve replacement, transfemoral N/A 07/02/2014    Procedure: TRANSCATHETER AORTIC VALVE REPLACEMENT, TRANSFEMORAL;  Surgeon: Sherren Mocha, MD;  Location: [place];  Service: Open Heart Surgery;  Laterality: N/A;  . Tee without cardioversion N/A 07/02/2014    Procedure: TRANSESOPHAGEAL ECHOCARDIOGRAM (TEE);  Surgeon: Sherren Mocha, MD;  Location: [place];  Service: Open Heart Surgery;  Laterality: N/A;  . Cardiac valve replacement    . Cardiac catheterization      "I've had 2-3 cardiac caths"  . Cataract extraction      "? side; ? lens placed"  . Coronary artery bypass graft  01/06/1997    LIMA to LAD, SVG to OM, open SVG harvest right lower leg - [facility]  . Skin cancer excision Left     "elbow"    Social History   Social History  . Marital Status: Widowed    Spouse Name: N/A  . Number of Children: 3  . Years of Education: N/A   Occupational History  . Not on file.   Social History Main Topics  . Smoking status: Former Smoker -- 3.00 packs/day for 20 years    Types: Cigarettes    Quit date: 02/06/1963  . Smokeless tobacco: Never Used  . Alcohol  Use: 4.2 oz/week    0 Standard drinks or equivalent, 7 Cans of beer per week  . Drug Use: No  . Sexual Activity: Not Currently   Other Topics Concern  . Not on file   Social History Narrative    ROS: no fevers or chills, productive cough, hemoptysis, dysphasia, odynophagia, melena, hematochezia, dysuria, hematuria, rash, seizure activity, orthopnea, PND, pedal edema, claudication. Remaining systems are negative.  Physical Exam: Well-developed frail in no acute distress.  Skin is warm and dry, ecchymotic  HEENT is normal.  Neck is supple.  Chest is clear to auscultation with normal expansion.  Cardiovascular exam is regular rate and rhythm. 2/6 systolic murmur LSB Abdominal exam nontender or distended. No masses palpated. Extremities show no edema. neuro grossly intact

## 2014-12-17 ENCOUNTER — Other Ambulatory Visit: Payer: Self-pay | Admitting: Nurse Practitioner

## 2014-12-17 NOTE — Telephone Encounter (Signed)
Per 08/30/14 office visit, patient is off of this medication.

## 2014-12-22 ENCOUNTER — Encounter: Payer: Self-pay | Admitting: Cardiology

## 2014-12-22 ENCOUNTER — Ambulatory Visit (INDEPENDENT_AMBULATORY_CARE_PROVIDER_SITE_OTHER): Payer: Medicare Other | Admitting: Cardiology

## 2014-12-22 VITALS — BP 128/62 | HR 84 | Ht 65.0 in | Wt 140.1 lb

## 2014-12-22 DIAGNOSIS — Z952 Presence of prosthetic heart valve: Secondary | ICD-10-CM

## 2014-12-22 DIAGNOSIS — R0602 Shortness of breath: Secondary | ICD-10-CM | POA: Diagnosis not present

## 2014-12-22 DIAGNOSIS — I10 Essential (primary) hypertension: Secondary | ICD-10-CM | POA: Diagnosis not present

## 2014-12-22 DIAGNOSIS — Z954 Presence of other heart-valve replacement: Secondary | ICD-10-CM | POA: Diagnosis not present

## 2014-12-22 DIAGNOSIS — I2510 Atherosclerotic heart disease of native coronary artery without angina pectoris: Secondary | ICD-10-CM

## 2014-12-22 DIAGNOSIS — I5032 Chronic diastolic (congestive) heart failure: Secondary | ICD-10-CM

## 2014-12-22 NOTE — Assessment & Plan Note (Signed)
Patient is euvolemic on examination. Continue present dose of Lasix. Check potassium, renal function and BNP. 

## 2014-12-22 NOTE — Assessment & Plan Note (Signed)
Continue statin. 

## 2014-12-22 NOTE — Assessment & Plan Note (Signed)
Continue SBE prophylaxis. 

## 2014-12-22 NOTE — Patient Instructions (Signed)

## 2014-12-22 NOTE — Assessment & Plan Note (Signed)
Continue aspirin and statin. 

## 2014-12-22 NOTE — Assessment & Plan Note (Signed)
Blood pressure controlled. Continue present medications. 

## 2014-12-23 LAB — BASIC METABOLIC PANEL
BUN: 36 mg/dL — AB (ref 7–25)
CHLORIDE: 100 mmol/L (ref 98–110)
CO2: 28 mmol/L (ref 20–31)
CREATININE: 1.44 mg/dL — AB (ref 0.70–1.11)
Calcium: 9.2 mg/dL (ref 8.6–10.3)
Glucose, Bld: 115 mg/dL — ABNORMAL HIGH (ref 65–99)
Potassium: 4.1 mmol/L (ref 3.5–5.3)
Sodium: 139 mmol/L (ref 135–146)

## 2014-12-23 LAB — BRAIN NATRIURETIC PEPTIDE: Brain Natriuretic Peptide: 116.8 pg/mL — ABNORMAL HIGH (ref 0.0–100.0)

## 2015-01-10 ENCOUNTER — Ambulatory Visit (INDEPENDENT_AMBULATORY_CARE_PROVIDER_SITE_OTHER): Payer: Medicare Other | Admitting: Sports Medicine

## 2015-01-10 ENCOUNTER — Encounter: Payer: Self-pay | Admitting: Sports Medicine

## 2015-01-10 VITALS — BP 145/73 | HR 71

## 2015-01-10 DIAGNOSIS — M1712 Unilateral primary osteoarthritis, left knee: Secondary | ICD-10-CM

## 2015-01-10 DIAGNOSIS — I2510 Atherosclerotic heart disease of native coronary artery without angina pectoris: Secondary | ICD-10-CM | POA: Diagnosis not present

## 2015-01-10 DIAGNOSIS — S51012A Laceration without foreign body of left elbow, initial encounter: Secondary | ICD-10-CM | POA: Insufficient documentation

## 2015-01-10 DIAGNOSIS — S41111A Laceration without foreign body of right upper arm, initial encounter: Secondary | ICD-10-CM

## 2015-01-10 NOTE — Progress Notes (Signed)
  Subjective:    CC: Follow-up   HPI: Left knee osteoarthritis: Getting some fluid, desires repeat aspiration.  Laceration right forearm: Right-sided, needs repair. Pain is mild, persistent.  Past medical history, Surgical history, Family history not pertinant except as noted below, Social history, Allergies, and medications have been entered into the medical record, reviewed, and no changes needed.   Review of Systems: No fevers, chills, night sweats, weight loss, chest pain, or shortness of breath.   Objective:    General: Well Developed, well nourished, and in no acute distress.  Neuro: Alert and oriented x3, extra-ocular muscles intact, sensation grossly intact.  HEENT: Normocephalic, atraumatic, pupils equal round reactive to light, neck supple, no masses, no lymphadenopathy, thyroid nonpalpable.  Skin: Warm and dry, no rashes. Cardiac: Regular rate and rhythm, no murmurs rubs or gallops, no lower extremity edema.  Respiratory: Clear to auscultation bilaterally. Not using accessory muscles, speaking in full sentences. Right forearm: Laceration into the subcutaneous tissues approximately 8 cm long. Left Knee: Normal to inspection with no erythema or effusion or obvious bony abnormalities. Visibly swollen with a palpable fluid wave and effusion. ROM normal in flexion and extension and lower leg rotation. Ligaments with solid consistent endpoints including ACL, PCL, LCL, MCL. Negative Mcmurray's and provocative meniscal tests. Non painful patellar compression. Patellar and quadriceps tendons unremarkable. Hamstring and quadriceps strength is normal.  Procedure: Real-time Ultrasound Guided aspiration Injection of left knee  Device: GE Logiq E  Verbal informed consent obtained.  Time-out conducted.  Noted no overlying erythema, induration, or other signs of local infection.  Skin prepped in a sterile fashion.  Local anesthesia: Topical Ethyl chloride.  With sterile technique  and under real time ultrasound guidance:  Aspirated 40 mL straw-colored fluid, syringe switched and 2 mL lidocaine, 2 mL Marcaine injected easily. Completed without difficulty  Pain immediately resolved suggesting accurate placement of the medication.  Advised to call if fevers/chills, erythema, induration, drainage, or persistent bleeding.  Images permanently stored and available for review in the ultrasound unit.  Impression: Technically successful ultrasound guided injection.  Complex laceration repair with wound revision Indication: bleeding Location: Right forearm Size: 8 cm Anesthesia: 1%lidocaine with epi, good effect Wound explored, irrigated, undermined and revised the edge of the wound and smoothed them out to decrease tension across the wound Type of suture material: 3-0 Ethilon Number of sutures: Multiple Tolerated well Routine postprocedure instructions d/w pt- keep area clean and bandaged, follow up if concerns/spreading erythema/pain.   Follow up for for suture removal.  Impression and Recommendations:    I spent 40 minutes with this patient, greater than 50% was face-to-face time counseling regarding the above diagnoses, this was separate from the time spent during the above procedures.

## 2015-01-10 NOTE — Assessment & Plan Note (Signed)
Complex revision of wound with undermining of edges and closure. Return in one week for suture removal.

## 2015-01-10 NOTE — Assessment & Plan Note (Signed)
Aspiration with lidocaine and Marcaine injection.

## 2015-01-17 ENCOUNTER — Ambulatory Visit: Payer: Medicare Other | Admitting: Sports Medicine

## 2015-01-17 ENCOUNTER — Encounter: Payer: Self-pay | Admitting: Sports Medicine

## 2015-01-17 ENCOUNTER — Other Ambulatory Visit: Payer: Self-pay | Admitting: Nurse Practitioner

## 2015-01-17 VITALS — BP 126/78 | HR 64 | Temp 98.3°F | Resp 16 | Wt 141.7 lb

## 2015-01-17 DIAGNOSIS — M5412 Radiculopathy, cervical region: Secondary | ICD-10-CM

## 2015-01-17 DIAGNOSIS — S41111D Laceration without foreign body of right upper arm, subsequent encounter: Secondary | ICD-10-CM

## 2015-01-17 MED ORDER — OXYCODONE-ACETAMINOPHEN 5-325 MG PO TABS: 0.5000 | ORAL_TABLET | Freq: Three times a day (TID) | ORAL | Status: DC | PRN

## 2015-01-17 NOTE — Progress Notes (Signed)
  Subjective:    CC: Follow-up  HPI: 1 week post complex laceration repair of the right forearm, here for suture removal.  Past medical history, Surgical history, Family history not pertinant except as noted below, Social history, Allergies, and medications have been entered into the medical record, reviewed, and no changes needed.   Review of Systems: No fevers, chills, night sweats, weight loss, chest pain, or shortness of breath.   Objective:    General: Well Developed, well nourished, and in no acute distress.  Neuro: Alert and oriented x3, extra-ocular muscles intact, sensation grossly intact.  HEENT: Normocephalic, atraumatic, pupils equal round reactive to light, neck supple, no masses, no lymphadenopathy, thyroid nonpalpable.  Skin: Warm and dry, no rashes. Cardiac: Regular rate and rhythm, no murmurs rubs or gallops, no lower extremity edema.  Respiratory: Clear to auscultation bilaterally. Not using accessory muscles, speaking in full sentences. Right forearm: Incision is clean, dry, intact, multiple simple interrupted sutures were removed and Dermabond was applied.  Impression and Recommendations:

## 2015-01-17 NOTE — Assessment & Plan Note (Signed)
Sutures removed today with Dermabond applied.

## 2015-01-20 ENCOUNTER — Other Ambulatory Visit: Payer: Self-pay | Admitting: Sports Medicine

## 2015-02-16 ENCOUNTER — Telehealth: Payer: Self-pay

## 2015-02-16 DIAGNOSIS — M5412 Radiculopathy, cervical region: Secondary | ICD-10-CM

## 2015-02-16 MED ORDER — OXYCODONE-ACETAMINOPHEN 5-325 MG PO TABS: 0.5000 | ORAL_TABLET | Freq: Three times a day (TID) | ORAL | Status: DC | PRN

## 2015-02-16 NOTE — Telephone Encounter (Signed)
Rx in box. 

## 2015-02-22 ENCOUNTER — Other Ambulatory Visit: Payer: Self-pay | Admitting: Sports Medicine

## 2015-03-14 ENCOUNTER — Other Ambulatory Visit: Payer: Self-pay | Admitting: Sports Medicine

## 2015-03-14 ENCOUNTER — Other Ambulatory Visit: Payer: Self-pay

## 2015-03-14 NOTE — Patient Outreach (Signed)
Eric Buchanan) Care Management  03/14/2015  Eric Buchanan April 04, 1925 [id number]  SUBJECTIVE: Telephone call to patient regarding [facility] high risk referral. HIPAA verified with patient. Discussed and offered [facility] care management services.  Patient states he has had 3 hospitalizations since January 2016.  Patient states one of his inpatient stays was for an aortic valve replacement.  Patient states he follows up with his cardiologist, Dr. Burt Knack in May 2017. Patient states this will be his year anniversary since having the aortic  Valve replacement. Patient states he sees his primary MD every 6 weeks or so. Patient states he also sees a doctor at the [place], Dr. Cassell Clement every 6 months. Patient states he has left knee problems. Patient states he develops swelling in his knee. Patient states his primary MD drains the fluid off of his knee periodically. Patient states hisknee is his main problem and he is ok with his doctor handling this. Patient states he refuses to have the knee operated on. Patient states he walks with a walker in the house and a cane outside. Patient denies any falls within the past year. Patient states he is still able to drive and has social support from his daughter. Patient states he has lung problems.  Patient states he is managing his lung problems with his medications and follow up with his doctor, Dr. Adelfa Koh.  Patient states he does not feel like he needs any additional information or follow up from [facility] care management services.  Patient refuses [facility] care management services.  Notified Eric Buchanan with Dr. Landry Corporal office of patients refusal of [facility] care management services.  ASSESSMENT; Potential for case management needs.   PLAN: RNCM will close patient due to refusal of services.  RNCM will notify patients primary MD, Dr. Dianah Field of patients refusal of services. Will request patients primary MD office to assist with engaging patient for [facility] care management  services.  Eric Plowman RN,BSN,CCM [facility] Telephonic  [phone]

## 2015-03-20 ENCOUNTER — Other Ambulatory Visit: Payer: Self-pay | Admitting: Sports Medicine

## 2015-03-22 ENCOUNTER — Other Ambulatory Visit: Payer: Self-pay

## 2015-03-22 DIAGNOSIS — M5412 Radiculopathy, cervical region: Secondary | ICD-10-CM

## 2015-03-22 MED ORDER — OXYCODONE-ACETAMINOPHEN 5-325 MG PO TABS: 0.5000 | ORAL_TABLET | Freq: Three times a day (TID) | ORAL | Status: DC | PRN

## 2015-04-10 ENCOUNTER — Other Ambulatory Visit: Payer: Self-pay | Admitting: Sports Medicine

## 2015-04-17 ENCOUNTER — Other Ambulatory Visit: Payer: Self-pay | Admitting: Sports Medicine

## 2015-04-25 ENCOUNTER — Other Ambulatory Visit: Payer: Self-pay | Admitting: Sports Medicine

## 2015-04-25 DIAGNOSIS — M5412 Radiculopathy, cervical region: Secondary | ICD-10-CM

## 2015-04-25 MED ORDER — OXYCODONE-ACETAMINOPHEN 5-325 MG PO TABS: 0.5000 | ORAL_TABLET | Freq: Three times a day (TID) | ORAL | Status: DC | PRN

## 2015-04-26 ENCOUNTER — Encounter: Payer: Self-pay | Admitting: Sports Medicine

## 2015-04-26 ENCOUNTER — Ambulatory Visit (INDEPENDENT_AMBULATORY_CARE_PROVIDER_SITE_OTHER): Payer: Medicare Other | Admitting: Sports Medicine

## 2015-04-26 VITALS — BP 152/76 | HR 76 | Resp 18 | Wt 140.5 lb

## 2015-04-26 DIAGNOSIS — M1712 Unilateral primary osteoarthritis, left knee: Secondary | ICD-10-CM | POA: Diagnosis not present

## 2015-04-26 DIAGNOSIS — J410 Simple chronic bronchitis: Secondary | ICD-10-CM | POA: Diagnosis not present

## 2015-04-26 DIAGNOSIS — M5412 Radiculopathy, cervical region: Secondary | ICD-10-CM

## 2015-04-26 MED ORDER — PREGABALIN 75 MG PO CAPS: 75.0000 mg | ORAL_CAPSULE | Freq: Two times a day (BID) | ORAL | Status: DC

## 2015-04-26 NOTE — Addendum Note (Signed)
Addended by: Elizabeth Sauer on: 04/26/2015 10:17 AM   Modules accepted: Orders, Medications

## 2015-04-26 NOTE — Progress Notes (Signed)
  Subjective:    CC: left knee pain  HPI: This is a pleasant 82 year old male with known left knee osteoarthritis, previous injection was greater than 4 months ago, having recurrence of pain and swelling. Desires interventional treatment today.  Right cervical radiculopathy: C7, has now failed multiple epidurals and facet injections without improvement, he has multilevel severe spondylosis at all of his cervical spine levels and this is inoperable. Overall symptoms have been moderately controlled with oxycodone, but he continues to have some paresthesias in the right arm. We have not tried a neuropathic agents yet.  Past medical history, Surgical history, Family history not pertinant except as noted below, Social history, Allergies, and medications have been entered into the medical record, reviewed, and no changes needed.   Review of Systems: No fevers, chills, night sweats, weight loss, chest pain, or shortness of breath.   Objective:    General: Well Developed, well nourished, and in no acute distress.  Neuro: Alert and oriented x3, extra-ocular muscles intact, sensation grossly intact.  HEENT: Normocephalic, atraumatic, pupils equal round reactive to light, neck supple, no masses, no lymphadenopathy, thyroid nonpalpable.  Skin: Warm and dry, no rashes. Cardiac: Regular rate and rhythm, no murmurs rubs or gallops, no lower extremity edema.  Respiratory: Clear to auscultation bilaterally. Not using accessory muscles, speaking in full sentences.  Procedure: Real-time Ultrasound Guided aspiration/Injection of left knee Device: GE Logiq E  Verbal informed consent obtained.  Time-out conducted.  Noted no overlying erythema, induration, or other signs of local infection.  Skin prepped in a sterile fashion.  Local anesthesia: Topical Ethyl chloride.  With sterile technique and under real time ultrasound guidance:   Using 18-gauge needle aspirated 33 mL straw-colored fluid, syringe switched  and 1 mL kenalog 40, 2 mL lidocaine, 2 mL Marcaine injected easily. Completed without difficulty  Pain immediately resolved suggesting accurate placement of the medication.  Advised to call if fevers/chills, erythema, induration, drainage, or persistent bleeding.  Images permanently stored and available for review in the ultrasound unit.  Impression: Technically successful ultrasound guided injection.  Impression and Recommendations:    I spent 25 minutes with this patient, greater than 50% was face-to-face time counseling regarding the above diagnoses, this time was separate from the time spent performing the procedure.

## 2015-04-26 NOTE — Assessment & Plan Note (Signed)
Inoperable cervical spondylosis. Cervical epidurals and facet injections have only been minimally efficacious. He continues with oxycodone twice a day with good pain control, I am going to add some Lyrica for further control of his radiculopathy.

## 2015-04-26 NOTE — Assessment & Plan Note (Signed)
Continues with Symbicort, Spiriva, and occasional cough drops. He would like to transfer his pulmonology care to the [place].

## 2015-04-26 NOTE — Assessment & Plan Note (Signed)
Aspiration and injection as above, return as needed

## 2015-05-08 ENCOUNTER — Other Ambulatory Visit: Payer: Self-pay | Admitting: Cardiology

## 2015-05-08 ENCOUNTER — Other Ambulatory Visit: Payer: Self-pay | Admitting: Sports Medicine

## 2015-05-13 ENCOUNTER — Other Ambulatory Visit: Payer: Self-pay | Admitting: Sports Medicine

## 2015-05-13 ENCOUNTER — Telehealth: Payer: Self-pay

## 2015-05-13 DIAGNOSIS — M5412 Radiculopathy, cervical region: Secondary | ICD-10-CM

## 2015-05-13 MED ORDER — PREGABALIN 75 MG PO CAPS
75.0000 mg | ORAL_CAPSULE | Freq: Two times a day (BID) | ORAL | Status: DC
Start: 2015-05-13 — End: 2015-08-11

## 2015-05-13 NOTE — Telephone Encounter (Signed)
Eric Buchanan called and states the Lyrica works well for him. He needs a new prescription of the medication.

## 2015-05-13 NOTE — Telephone Encounter (Signed)
Rx in box, patient failed gabapentin.

## 2015-05-19 ENCOUNTER — Other Ambulatory Visit: Payer: Self-pay

## 2015-05-19 DIAGNOSIS — I350 Nonrheumatic aortic (valve) stenosis: Secondary | ICD-10-CM

## 2015-05-19 DIAGNOSIS — Z952 Presence of prosthetic heart valve: Secondary | ICD-10-CM

## 2015-05-24 ENCOUNTER — Encounter: Payer: Self-pay | Admitting: Sports Medicine

## 2015-05-24 ENCOUNTER — Ambulatory Visit (INDEPENDENT_AMBULATORY_CARE_PROVIDER_SITE_OTHER): Payer: Medicare Other | Admitting: Sports Medicine

## 2015-05-24 VITALS — BP 148/76 | HR 71 | Resp 18 | Wt 142.7 lb

## 2015-05-24 DIAGNOSIS — F32A Depression, unspecified: Secondary | ICD-10-CM

## 2015-05-24 DIAGNOSIS — M5412 Radiculopathy, cervical region: Secondary | ICD-10-CM

## 2015-05-24 DIAGNOSIS — F329 Major depressive disorder, single episode, unspecified: Secondary | ICD-10-CM

## 2015-05-24 DIAGNOSIS — F320 Major depressive disorder, single episode, mild: Secondary | ICD-10-CM

## 2015-05-24 MED ORDER — MIRTAZAPINE 30 MG PO TABS: 30.0000 mg | ORAL_TABLET | Freq: Every day | ORAL | Status: DC

## 2015-05-24 MED ORDER — OXYCODONE-ACETAMINOPHEN 5-325 MG PO TABS: 0.5000 | ORAL_TABLET | Freq: Three times a day (TID) | ORAL | Status: DC | PRN

## 2015-05-24 MED ORDER — MEGESTROL ACETATE 400 MG/10ML PO SUSP: 800.0000 mg | Freq: Every day | ORAL | Status: DC

## 2015-05-24 NOTE — Assessment & Plan Note (Signed)
Doing well with mirtazapine, only a 2 pound weight gain. Increasing, and adding Megace.

## 2015-05-24 NOTE — Progress Notes (Signed)
  Subjective:    CC:  Follow-up  HPI: Eric Buchanan has noted a good improvement in his neck and radicular pain with Lyrica, it was unfortunately too expensive, he is wondering if samples can be given.  Adult failure to thrive: Stabilized with mirtazapine, needs additional appetite augmentation.  Past medical history, Surgical history, Family history not pertinant except as noted below, Social history, Allergies, and medications have been entered into the medical record, reviewed, and no changes needed.   Review of Systems: No fevers, chills, night sweats, weight loss, chest pain, or shortness of breath.   Objective:    General: Well Developed, well nourished, and in no acute distress.  Neuro: Alert and oriented x3, extra-ocular muscles intact, sensation grossly intact.  HEENT: Normocephalic, atraumatic, pupils equal round reactive to light, neck supple, no masses, no lymphadenopathy, thyroid nonpalpable.  Skin: Warm and dry, no rashes. Cardiac: Regular rate and rhythm, no murmurs rubs or gallops, no lower extremity edema.  Respiratory: Clear to auscultation bilaterally. Not using accessory muscles, speaking in full sentences.  Impression and Recommendations:

## 2015-05-24 NOTE — Assessment & Plan Note (Signed)
Good response to Lyrica, extra samples given today.

## 2015-06-01 ENCOUNTER — Encounter: Payer: Medicare Other | Admitting: Cardiology

## 2015-06-01 NOTE — Progress Notes (Signed)
HPI: FU AVR. Abdominal ultrasound October 2013 showed no aneurysm. Echocardiogram February 2016 showed normal LV function and severe aortic stenosis. Cardiac catheterization April 2016 showed severe three-vessel coronary artery disease. The LIMA to the LAD was patent. Saphenous vein graft to the second marginal was also patent but there was note of a 75% stenosis in the mid graft. There was moderate pulmonary hypertension. CTA May 2016 showed tiny pulmonary nodules and follow-up recommended in 6-12 months. There is note of mild fusiform dilatation of the abdominal aorta measuring 2.5 x 2.4 cm. Patient underwent TAVR in May 2016. Patient admitted in June 2016 with acute diastolic CHF, NSTEMI and hemoptysis; recurrent admission at [facility] with hemoptysis and plavix DCed. Carotid dopplers 8/16 with no significant stenosis. Last echo 9/16 showed normal LV function, grade 2 diastolic dysfunction, TAVR with mild AI, mean gradient 20 mmHg, mild MR, mild LAE, mildly elevated pulmonary pressure. Since last seen   Current Outpatient Prescriptions  Medication Sig Dispense Refill  . albuterol (PROVENTIL HFA;VENTOLIN HFA) 108 (90 BASE) MCG/ACT inhaler Inhale 1 puff into the lungs every 4 (four) hours as needed for shortness of breath.     . allopurinol (ZYLOPRIM) 100 MG tablet TAKE 1 TABLET BY MOUTH EVERY DAY 90 tablet 2  . AMBULATORY NON FORMULARY MEDICATION Shower chair, wrist life alert bracelet 1 each 0  . aspirin 81 MG tablet Take 81 mg by mouth at bedtime.     Marland Kitchen atorvastatin (LIPITOR) 40 MG tablet Take 1 tablet (40 mg total) by mouth daily. 90 tablet 3  . atorvastatin (LIPITOR) 40 MG tablet TAKE 1 TABLET (40 MG TOTAL) BY MOUTH DAILY. 30 tablet 0  . budesonide-formoterol (SYMBICORT) 160-4.5 MCG/ACT inhaler Inhale 2 puffs into the lungs 2 (two) times daily. 1 Inhaler 0  . fluticasone (FLONASE) 50 MCG/ACT nasal spray One spray in each nostril twice a day, use left hand for right nostril, and right hand  for left nostril. (Patient taking differently: Place 1 spray into both nostrils daily. One spray in each nostril twice a day, use left hand for right nostril, and right hand for left nostril.) 48 g 0  . furosemide (LASIX) 20 MG tablet Take 2 tablets (40 mg total) by mouth daily. 60 tablet 12  . isosorbide mononitrate (IMDUR) 60 MG 24 hr tablet Take 1 tablet (60 mg total) by mouth daily. 30 tablet 11  . megestrol (MEGACE) 400 MG/10ML suspension Take 20 mLs (800 mg total) by mouth daily. 240 mL 0  . metoprolol tartrate (LOPRESSOR) 25 MG tablet Take 0.5 tablets (12.5 mg total) by mouth 2 (two) times daily. 60 tablet 6  . metoprolol tartrate (LOPRESSOR) 25 MG tablet TAKE 1 TABLET (25 MG TOTAL) BY MOUTH 2 (TWO) TIMES DAILY. 60 tablet 6  . mirtazapine (REMERON) 30 MG tablet Take 1 tablet (30 mg total) by mouth at bedtime. 30 tablet 11  . oxyCODONE-acetaminophen (PERCOCET/ROXICET) 5-325 MG tablet Take 0.5 tablets by mouth every 8 (eight) hours as needed. 45 tablet 0  . pregabalin (LYRICA) 75 MG capsule Take 1 capsule (75 mg total) by mouth 2 (two) times daily. 60 capsule 3  . Tiotropium Bromide Monohydrate (SPIRIVA RESPIMAT) 2.5 MCG/ACT AERS Inhale 2.5 mg into the lungs every morning.     No current facility-administered medications for this visit.     Past Medical History  Diagnosis Date  . Hypertension   . Asthma   . Gout   . Hyperlipidemia   . Aortic stenosis   .  CAD (coronary artery disease)   . Inguinal hernia, right 05/15/2013    "they couldn't do OR"  . COPD (chronic obstructive pulmonary disease) ([place]) 10/11/2011  . Coronary artery disease involving native coronary artery   . Coronary artery disease involving coronary bypass graft 05/10/2014  . Zenker's diverticulum 05/21/2014  . Chronic combined systolic and diastolic congestive heart failure ([place])   . Mitral regurgitation   . Anginal pain ([place])   . Shortness of breath dyspnea   . GERD (gastroesophageal reflux disease)   . S/P TAVR  (transcatheter aortic valve replacement) 07/02/2014    a. 07/02/2014 TAVR: 23 mm Edwards Sapien 3 transcatheter heart valve placed via open right transfemoral approach;  b. 07/03/2014 Echo: EF 50-55%, no central or paravalvular leak, nl transaortic gradients, mod MR, mildly dil LA, mildly reduced RV fxn, PASP 46 mmHg.  Marland Kitchen Heart murmur   . Stroke [facility]) 1965    denies residual on 07/30/2014  . Arthritis     "all over"  . Chronic neck pain   . Chronic pain of left knee   . Chronic kidney disease (CKD)   . Skin cancer     REMOVED L ELBOW  . NSTEMI (non-ST elevated myocardial infarction) ([place]) 07/30/2014    Past Surgical History  Procedure Laterality Date  . Left and right heart catheterization with coronary/graft angiogram N/A 05/10/2014    Procedure: LEFT AND RIGHT HEART CATHETERIZATION WITH Beatrix Fetters;  Surgeon: Sherren Mocha, MD;  [facility] C[facility] LAB;  Service: Cardiovascular;  Laterality: N/A;  . Tee without cardioversion N/A 05/17/2014    Procedure: TRANSESOPHAGEAL ECHOCARDIOGRAM (TEE);  Surgeon: Lelon Perla, MD;  Location: [facility] ENDOSCOPY;  Service: Cardiovascular;  Laterality: N/A;  . Transcatheter aortic valve replacement, transfemoral N/A 07/02/2014    Procedure: TRANSCATHETER AORTIC VALVE REPLACEMENT, TRANSFEMORAL;  Surgeon: Sherren Mocha, MD;  Location: [place];  Service: Open Heart Surgery;  Laterality: N/A;  . Tee without cardioversion N/A 07/02/2014    Procedure: TRANSESOPHAGEAL ECHOCARDIOGRAM (TEE);  Surgeon: Sherren Mocha, MD;  Location: [place];  Service: Open Heart Surgery;  Laterality: N/A;  . Cardiac valve replacement    . Cardiac catheterization      "I've had 2-3 cardiac caths"  . Cataract extraction      "? side; ? lens placed"  . Coronary artery bypass graft  01/06/1997    LIMA to LAD, SVG to OM, open SVG harvest right lower leg - [facility]  . Skin cancer excision Left     "elbow"    Social History   Social History  . Marital Status:  Widowed    Spouse Name: N/A  . Number of Children: 3  . Years of Education: N/A   Occupational History  . Not on file.   Social History Main Topics  . Smoking status: Former Smoker -- 3.00 packs/day for 20 years    Types: Cigarettes    Quit date: 02/06/1963  . Smokeless tobacco: Never Used  . Alcohol Use: 4.2 oz/week    0 Standard drinks or equivalent, 7 Cans of beer per week  . Drug Use: No  . Sexual Activity: Not Currently   Other Topics Concern  . Not on file   Social History Narrative    Family History  Problem Relation Age of Onset  . Heart attack Mother   . Heart attack Brother     ROS: no fevers or chills, productive cough, hemoptysis, dysphasia, odynophagia, melena, hematochezia, dysuria, hematuria, rash, seizure activity, orthopnea, PND, pedal  edema, claudication. Remaining systems are negative.  Physical Exam: Well-developed well-nourished in no acute distress.  Skin is warm and dry.  HEENT is normal.  Neck is supple.  Chest is clear to auscultation with normal expansion.  Cardiovascular exam is regular rate and rhythm.  Abdominal exam nontender or distended. No masses palpated. Extremities show no edema. neuro grossly intact  ECG     This encounter was created in error - please disregard.

## 2015-06-07 ENCOUNTER — Other Ambulatory Visit: Payer: Self-pay | Admitting: Sports Medicine

## 2015-06-14 ENCOUNTER — Ambulatory Visit (INDEPENDENT_AMBULATORY_CARE_PROVIDER_SITE_OTHER): Payer: Medicare Other | Admitting: Sports Medicine

## 2015-06-14 ENCOUNTER — Encounter: Payer: Self-pay | Admitting: Sports Medicine

## 2015-06-14 ENCOUNTER — Ambulatory Visit (INDEPENDENT_AMBULATORY_CARE_PROVIDER_SITE_OTHER): Payer: Medicare Other

## 2015-06-14 DIAGNOSIS — J410 Simple chronic bronchitis: Secondary | ICD-10-CM | POA: Diagnosis not present

## 2015-06-14 DIAGNOSIS — R918 Other nonspecific abnormal finding of lung field: Secondary | ICD-10-CM | POA: Diagnosis not present

## 2015-06-14 NOTE — Progress Notes (Signed)
  Subjective:    CC: Hospital follow-up  HPI: This pleasant 82 year old male was admitted at [facility] for pneumonia, he is treated with IV antibiotics initially and discharged on 5 days of levofloxacin, blood cultures were persistently negative, and overall he did well. He still has a bit of early fatigability and mild shortness of breath without any cough, no constitutional symptoms.  Left knee arthritis: Desires aspiration injection, we recently did this 2 months ago, understands we need to wait another month.  Past medical history, Surgical history, Family history not pertinant except as noted below, Social history, Allergies, and medications have been entered into the medical record, reviewed, and no changes needed.   Review of Systems: No fevers, chills, night sweats, weight loss, chest pain, or shortness of breath.   Objective:    General: Well Developed, well nourished, and in no acute distress.  Neuro: Alert and oriented x3, extra-ocular muscles intact, sensation grossly intact.  HEENT: Normocephalic, atraumatic, pupils equal round reactive to light, neck supple, no masses, no lymphadenopathy, thyroid nonpalpable.  Skin: Warm and dry, no rashes. Cardiac: Regular rate and rhythm, no murmurs rubs or gallops, no lower extremity edema.  Respiratory: Clear to auscultation bilaterally. Not using accessory muscles, speaking in full sentences. Left Knee: Overall normal to inspection with only minimal effusion Palpation normal with no warmth or joint line tenderness or patellar tenderness or condyle tenderness. ROM normal in flexion and extension and lower leg rotation. Ligaments with solid consistent endpoints including ACL, PCL, LCL, MCL. Negative Mcmurray's and provocative meniscal tests. Non painful patellar compression. Patellar and quadriceps tendons unremarkable. Hamstring and quadriceps strength is normal.  Chest x-ray shows persistence of the right-sided  infiltrates, not completely surprising.  Impression and Recommendations:    I spent 25 minutes with this patient, greater than 50% was face-to-face time counseling regarding the above diagnoses

## 2015-06-14 NOTE — Assessment & Plan Note (Signed)
Has a single daily antibiotics left, blood cultures were negative in the hospital. Exam is benign. Repeat chest x-ray, finish antibiotics, return as needed. Satting at 97% on room air.

## 2015-06-21 ENCOUNTER — Ambulatory Visit: Payer: Medicare Other | Admitting: Sports Medicine

## 2015-06-27 ENCOUNTER — Encounter: Payer: Self-pay | Admitting: Cardiovascular Disease

## 2015-06-27 ENCOUNTER — Other Ambulatory Visit: Payer: Self-pay

## 2015-06-27 ENCOUNTER — Ambulatory Visit (HOSPITAL_COMMUNITY): Payer: Medicare Other | Attending: Cardiology

## 2015-06-27 ENCOUNTER — Ambulatory Visit (INDEPENDENT_AMBULATORY_CARE_PROVIDER_SITE_OTHER): Payer: Medicare Other | Admitting: Cardiovascular Disease

## 2015-06-27 VITALS — BP 146/70 | HR 56 | Ht 67.0 in | Wt 138.1 lb

## 2015-06-27 DIAGNOSIS — I350 Nonrheumatic aortic (valve) stenosis: Secondary | ICD-10-CM | POA: Diagnosis not present

## 2015-06-27 DIAGNOSIS — I5033 Acute on chronic diastolic (congestive) heart failure: Secondary | ICD-10-CM | POA: Diagnosis not present

## 2015-06-27 DIAGNOSIS — Z954 Presence of other heart-valve replacement: Secondary | ICD-10-CM

## 2015-06-27 DIAGNOSIS — R0602 Shortness of breath: Secondary | ICD-10-CM

## 2015-06-27 DIAGNOSIS — Z952 Presence of prosthetic heart valve: Secondary | ICD-10-CM

## 2015-06-27 MED ORDER — FUROSEMIDE 40 MG PO TABS: 40.0000 mg | ORAL_TABLET | Freq: Two times a day (BID) | ORAL | Status: DC

## 2015-06-27 NOTE — Patient Instructions (Addendum)
Medication Instructions:  Your physician has recommended you make the following change in your medication:  1. INCREASE Furosemide to 40mg  take one tablet by mouth twice a day  Labwork: Your physician recommends that you return for lab work in: 2 WEEKS (BMP)  Testing/Procedures: No new orders.   Follow-Up: Your physician wants you to follow-up in: 88 WEEKS with Dr Stanford Breed [facility] office).  You will receive a reminder letter in the mail two months in advance. If you don't receive a letter, please call our office to schedule the follow-up appointment.   Any Other Special Instructions Will Be Listed Below (If Applicable).  Your physician discussed the importance of taking an antibiotic prior to any dental, gastrointestinal, genitourinary procedures to prevent damage to the heart valves from infection.   If you need a refill on your cardiac medications before your next appointment, please call your pharmacy.

## 2015-06-27 NOTE — Progress Notes (Signed)
Cardiology Office Note Date:  06/27/2015   ID:  Yoscar Alberts, DOB 1925/07/31, MRN [id number]  PCP:  Aundria Mems, MD  Cardiologist:  Sherren Mocha, MD    Chief Complaint  Patient presents with  . Aortic Valve Disease s/p TAVR    C/O sob, and lee in right leg. no cp or claudication     History of Present Illness: Eric Buchanan is a 82 y.o. male who presents for one year TAVR follow-up. The patient underwent TAVR via a transfemoral approach Jul 02, 2014, with a 23 mm Sapien 3 THV.  He was recently hospitalized with pneumonia, but feels that he's recovered well. He continues to complain of exertional dyspnea with fairly modest activity. His breathing is better than it was prior to TAVR, but he is still limited with walking on level ground over short distances. He denies orthopnea, PND, but does have leg swelling. He's had no chest pain, chest pressure, or lightheadedness. He's been compliant with his medications.   Past Medical History  Diagnosis Date  . Hypertension   . Asthma   . Gout   . Hyperlipidemia   . Aortic stenosis   . CAD (coronary artery disease)   . Inguinal hernia, right 05/15/2013    "they couldn't do OR"  . COPD (chronic obstructive pulmonary disease) ([place]) 10/11/2011  . Coronary artery disease involving native coronary artery   . Coronary artery disease involving coronary bypass graft 05/10/2014  . Zenker's diverticulum 05/21/2014  . Chronic combined systolic and diastolic congestive heart failure ([place])   . Mitral regurgitation   . Anginal pain ([place])   . Shortness of breath dyspnea   . GERD (gastroesophageal reflux disease)   . 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 07/02/2014 TAVR: 23 mm Edwards Sapien 3 transcatheter heart valve placed via open right transfemoral approach;  b. 07/03/2014 Echo: EF 50-55%, no central or paravalvular leak, nl transaortic gradients, mod MR, mildly dil LA, mildly reduced RV fxn, PASP 46 mmHg.  Marland Kitchen Heart  murmur   . Stroke [facility]) 1965    denies residual on 07/30/2014  . Arthritis     "all over"  . Chronic neck pain   . Chronic pain of left knee   . Chronic kidney disease (CKD)   . Skin cancer     REMOVED L ELBOW  . NSTEMI (non-ST elevated myocardial infarction) ([place]) 07/30/2014    Past Surgical History  Procedure Laterality Date  . Left and right heart catheterization with coronary/graft angiogram N/A 05/10/2014    Procedure: LEFT AND RIGHT HEART CATHETERIZATION WITH Beatrix Fetters;  Surgeon: Sherren Mocha, MD;  Location: [facility] CATH LAB;  Service: Cardiovascular;  Laterality: N/A;  . Tee without cardioversion N/A 05/17/2014    Procedure: TRANSESOPHAGEAL ECHOCARDIOGRAM (TEE);  Surgeon: Lelon Perla, MD;  Location: [facility] ENDOSCOPY;  Service: Cardiovascular;  Laterality: N/A;  . Transcatheter aortic valve replacement, transfemoral N/A 07/02/2014    Procedure: TRANSCATHETER AORTIC VALVE REPLACEMENT, TRANSFEMORAL;  Surgeon: Sherren Mocha, MD;  Location: [facility];  Service: Open Heart Surgery;  Laterality: N/A;  . Tee without cardioversion N/A 07/02/2014    Procedure: TRANSESOPHAGEAL ECHOCARDIOGRAM (TEE);  Surgeon: Sherren Mocha, MD;  Location: [facility];  Service: Open Heart Surgery;  Laterality: N/A;  . Cardiac valve replacement    . Cardiac catheterization      "I've had 2-3 cardiac caths"  . Cataract extraction      "? side; ? lens placed"  . Coronary artery bypass graft  01/06/1997    LIMA to LAD, SVG to OM, open SVG harvest right lower leg - [facility]  . Skin cancer excision Left     "elbow"    Current Outpatient Prescriptions  Medication Sig Dispense Refill  . albuterol (PROVENTIL HFA;VENTOLIN HFA) 108 (90 BASE) MCG/ACT inhaler Inhale 1 puff into the lungs every 4 (four) hours as needed for shortness of breath.     . allopurinol (ZYLOPRIM) 100 MG tablet TAKE 1 TABLET BY MOUTH EVERY DAY 90 tablet 2  . AMBULATORY NON FORMULARY MEDICATION Shower chair, wrist life alert  bracelet 1 each 0  . aspirin 81 MG tablet Take 81 mg by mouth at bedtime.     Marland Kitchen atorvastatin (LIPITOR) 40 MG tablet Take 1 tablet (40 mg total) by mouth daily. 90 tablet 3  . atorvastatin (LIPITOR) 40 MG tablet TAKE 1 TABLET (40 MG TOTAL) BY MOUTH DAILY. 30 tablet 0  . budesonide-formoterol (SYMBICORT) 160-4.5 MCG/ACT inhaler Inhale 2 puffs into the lungs every morning. Inhale 1 puff in the evening    . fluticasone (FLONASE) 50 MCG/ACT nasal spray Place 1 spray into both nostrils daily.    . furosemide (LASIX) 20 MG tablet Take 2 tablets (40 mg total) by mouth daily. 60 tablet 12  . isosorbide mononitrate (IMDUR) 60 MG 24 hr tablet Take 1 tablet (60 mg total) by mouth daily. 30 tablet 11  . megestrol (MEGACE) 400 MG/10ML suspension Take 20 mLs (800 mg total) by mouth daily. 240 mL 0  . metoprolol tartrate (LOPRESSOR) 25 MG tablet Take 0.5 tablets (12.5 mg total) by mouth 2 (two) times daily. 60 tablet 6  . metoprolol tartrate (LOPRESSOR) 25 MG tablet TAKE 1 TABLET (25 MG TOTAL) BY MOUTH 2 (TWO) TIMES DAILY. 60 tablet 6  . mirtazapine (REMERON) 30 MG tablet Take 1 tablet (30 mg total) by mouth at bedtime. 30 tablet 11  . oxyCODONE-acetaminophen (PERCOCET/ROXICET) 5-325 MG tablet Take 0.5 tablets by mouth every 8 (eight) hours as needed for moderate pain or severe pain.    . pregabalin (LYRICA) 75 MG capsule Take 1 capsule (75 mg total) by mouth 2 (two) times daily. 60 capsule 3  . Tiotropium Bromide Monohydrate (SPIRIVA RESPIMAT) 2.5 MCG/ACT AERS Inhale 2.5 mg into the lungs every morning.     No current facility-administered medications for this visit.    Allergies:   Clopidogrel bisulfate; Beta adrenergic blockers; and Hydrocodone   Social History:  The patient  reports that he quit smoking about 52 years ago. His smoking use included Cigarettes. He has a 60 pack-year smoking history. He has never used smokeless tobacco. He reports that he drinks about 4.2 oz of alcohol per week. He reports  that he does not use illicit drugs.   Family History:  The patient's  family history includes Heart attack in his brother and mother.    ROS:  Please see the history of present illness.  Otherwise, review of systems is positive for Hearing loss, visual disturbance, easy bruising, balance problems.  All other systems are reviewed and negative.    PHYSICAL EXAM: VS:  BP 146/70 mmHg  Pulse 56  Ht 5\' 7"  (1.702 m)  Wt 138 lb 1.9 oz (62.651 kg)  BMI 21.63 kg/m2 , BMI Body mass index is 21.63 kg/(m^2). GEN: Well nourished, well developed, pleasant elderly male in no acute distress HEENT: normal Neck: no JVD, no masses. No carotid bruits Cardiac: RRR with 2/6 SEM at the RUSB and 2/6 holosystolic murmur at  the apex             Respiratory:  clear to auscultation bilaterally, normal work of breathing GI: soft, nontender, nondistended, + BS MS: no deformity or atrophy Ext: no pretibial edema, pedal pulses 2+= bilaterally Skin: warm and dry, no rash Neuro:  Strength and sensation are intact Psych: euthymic mood, full affect  EKG:  EKG is ordered today. The ekg ordered today shows sinus bradycardia with premature atrial contractions, moderate voltage criteria for LVH may be normal variant  Recent Labs: 06/30/2014: ALT 20 07/03/2014: Magnesium 2.2 11/03/2014: Hemoglobin 13.0; Platelets 233; TSH 1.581 12/22/2014: Brain Natriuretic Peptide 116.8*; BUN 36*; Creat 1.44*; Potassium 4.1; Sodium 139   Lipid Panel     Component Value Date/Time   CHOL 128 05/07/2014 0855   TRIG 80 05/07/2014 0855   HDL 60 05/07/2014 0855   CHOLHDL 2.1 05/07/2014 0855   VLDL 16 05/07/2014 0855   LDLCALC 52 05/07/2014 0855      Wt Readings from Last 3 Encounters:  06/27/15 138 lb 1.9 oz (62.651 kg)  06/14/15 140 lb 1.6 oz (63.549 kg)  05/24/15 142 lb 11.2 oz (64.728 kg)     Cardiac Studies Reviewed: 2D Echo: Left ventricle: The cavity size was normal. There was mild concentric hypertrophy. Systolic  function was normal. The estimated ejection fraction was in the range of 55% to 60%. Wall motion was normal; there were no regional wall motion abnormalities.  ------------------------------------------------------------------- Aortic valve: S/P TAVR with normal functioning Particia Lather XT bioprosthesis. There is mild eccentric aortic regurgitation. Trileaflet; normal thickness leaflets. Mobility was not restricted. Doppler: Transvalvular velocity was within the normal range. There was no stenosis. There was mild perivalvular regurgitation.  VTI ratio of LVOT to aortic valve: 0.4. Valve area (VTI): 1.02 cm^2. Indexed valve area (VTI): 0.6 cm^2/m^2. Peak velocity ratio of LVOT to aortic valve: 0.39. Valve area (Vmax): 0.99 cm^2. Indexed valve area (Vmax): 0.58 cm^2/m^2. Mean velocity ratio of LVOT to aortic valve: 0.38. Valve area (Vmean): 0.97 cm^2. Indexed valve area (Vmean): 0.56 cm^2/m^2. Mean gradient (S): 14 mm Hg. Peak gradient (S): 44 mm Hg.  ------------------------------------------------------------------- Aorta: Aortic root: The aortic root was normal in size.  ------------------------------------------------------------------- Mitral valve: Calcified annulus. Mobility was not restricted. Doppler: Transvalvular velocity was within the normal range. There was no evidence for stenosis. There was mild regurgitation. Peak gradient (D): 5 mm Hg.  ------------------------------------------------------------------- Left atrium: The atrium was mildly dilated.  ------------------------------------------------------------------- Right ventricle: The cavity size was normal. Wall thickness was normal. Systolic function was normal.  ------------------------------------------------------------------- Pulmonic valve: Structurally normal valve. Cusp separation was normal. Doppler: Transvalvular velocity was within the normal range. There was no  evidence for stenosis. There was no regurgitation.  ------------------------------------------------------------------- Tricuspid valve: Structurally normal valve. Doppler: Transvalvular velocity was within the normal range. There was mild regurgitation.  ------------------------------------------------------------------- Pulmonary artery: The main pulmonary artery was normal-sized. Systolic pressure was within the normal range.  ------------------------------------------------------------------- Right atrium: The atrium was normal in size.  ------------------------------------------------------------------- Pericardium: There was no pericardial effusion.  ------------------------------------------------------------------- Systemic veins: Inferior vena cava: The vessel was normal in size.   ASSESSMENT AND PLAN: 1.  Aortic valve disease s/p TAVR, one year visit 2. Acute on chronic diastolic heart failure, NYHA III sx's 3. Moderate MR 4. CAD s/p CABG, no angina  Echo reviewed - there is at least mild perivalvular AI present. MR appears worse than previous echo. I suspect some volume overload and will increase furosemide to 40 mg BID, repeat BMET  in a few weeks, and arrange FU with Dr Stanford Breed in [place]. Pt reminded about SBE prophylaxis.   Current medicines are reviewed with the patient today.  The patient does not have concerns regarding medicines.  Labs/ tests ordered today include:  No orders of the defined types were placed in this encounter.   Disposition:   FU Dr Christiana Fuchs, Sherren Mocha, MD  06/27/2015 11:07 AM    [address], [place], [place]  [address] Phone: [phone]; Fax: [phone]

## 2015-06-28 ENCOUNTER — Other Ambulatory Visit: Payer: Self-pay

## 2015-06-28 DIAGNOSIS — M5412 Radiculopathy, cervical region: Secondary | ICD-10-CM

## 2015-06-28 MED ORDER — OXYCODONE-ACETAMINOPHEN 5-325 MG PO TABS: 0.5000 | ORAL_TABLET | Freq: Three times a day (TID) | ORAL | Status: DC | PRN

## 2015-06-30 ENCOUNTER — Ambulatory Visit: Payer: Medicare Other | Admitting: Cardiovascular Disease

## 2015-06-30 ENCOUNTER — Other Ambulatory Visit (HOSPITAL_COMMUNITY): Payer: Medicare Other

## 2015-07-06 ENCOUNTER — Other Ambulatory Visit: Payer: Self-pay | Admitting: Sports Medicine

## 2015-07-11 ENCOUNTER — Other Ambulatory Visit: Payer: Medicare Other

## 2015-07-12 ENCOUNTER — Encounter: Payer: Self-pay | Admitting: Physician Assistant

## 2015-07-12 ENCOUNTER — Telehealth: Payer: Self-pay | Admitting: Cardiology

## 2015-07-12 ENCOUNTER — Ambulatory Visit
Admission: RE | Admit: 2015-07-12 | Discharge: 2015-07-12 | Disposition: A | Discharge status: Home / Self Care | Payer: Medicare Other | Source: Ambulatory Visit | Attending: Physician Assistant | Admitting: Physician Assistant

## 2015-07-12 ENCOUNTER — Ambulatory Visit (INDEPENDENT_AMBULATORY_CARE_PROVIDER_SITE_OTHER): Payer: Medicare Other | Admitting: Physician Assistant

## 2015-07-12 ENCOUNTER — Encounter: Payer: Self-pay | Admitting: *Deleted

## 2015-07-12 ENCOUNTER — Ambulatory Visit (INDEPENDENT_AMBULATORY_CARE_PROVIDER_SITE_OTHER): Payer: Medicare Other | Admitting: Sports Medicine

## 2015-07-12 VITALS — BP 132/74 | HR 83 | Resp 18 | Wt 138.8 lb

## 2015-07-12 VITALS — BP 128/64 | HR 92 | Ht 67.0 in | Wt 138.2 lb

## 2015-07-12 DIAGNOSIS — R0602 Shortness of breath: Secondary | ICD-10-CM

## 2015-07-12 DIAGNOSIS — I25119 Atherosclerotic heart disease of native coronary artery with unspecified angina pectoris: Secondary | ICD-10-CM

## 2015-07-12 DIAGNOSIS — Z952 Presence of prosthetic heart valve: Secondary | ICD-10-CM

## 2015-07-12 DIAGNOSIS — I5032 Chronic diastolic (congestive) heart failure: Secondary | ICD-10-CM

## 2015-07-12 DIAGNOSIS — J42 Unspecified chronic bronchitis: Secondary | ICD-10-CM

## 2015-07-12 DIAGNOSIS — S41111D Laceration without foreign body of right upper arm, subsequent encounter: Secondary | ICD-10-CM | POA: Diagnosis not present

## 2015-07-12 DIAGNOSIS — I340 Nonrheumatic mitral (valve) insufficiency: Secondary | ICD-10-CM

## 2015-07-12 DIAGNOSIS — I10 Essential (primary) hypertension: Secondary | ICD-10-CM

## 2015-07-12 DIAGNOSIS — N183 Chronic kidney disease, stage 3 unspecified: Secondary | ICD-10-CM

## 2015-07-12 DIAGNOSIS — Z954 Presence of other heart-valve replacement: Secondary | ICD-10-CM

## 2015-07-12 DIAGNOSIS — D649 Anemia, unspecified: Secondary | ICD-10-CM

## 2015-07-12 LAB — BASIC METABOLIC PANEL
CO2: 24 mmol/L (ref 20–31)
Calcium: 9.8 mg/dL (ref 8.6–10.3)
Chloride: 99 mmol/L (ref 98–110)
Sodium: 137 mmol/L (ref 135–146)

## 2015-07-12 LAB — CBC
HCT: 38 % — ABNORMAL LOW (ref 38.5–50.0)
Hemoglobin: 12.9 g/dL — ABNORMAL LOW (ref 13.2–17.1)
MCH: 30.7 pg (ref 27.0–33.0)
MCHC: 33.9 g/dL (ref 32.0–36.0)
MCV: 90.5 fL (ref 80.0–100.0)
MPV: 11.4 fL (ref 7.5–12.5)
Platelets: 260 10*3/uL (ref 140–400)
RBC: 4.2 MIL/uL (ref 4.20–5.80)
RDW: 14.4 % (ref 11.0–15.0)
WBC: 13.9 K/uL — ABNORMAL HIGH (ref 3.8–10.8)

## 2015-07-12 LAB — VITAMIN B12: Vitamin B-12: 257 pg/mL (ref 200–1100)

## 2015-07-12 LAB — BASIC METABOLIC PANEL WITH GFR
BUN: 56 mg/dL — ABNORMAL HIGH (ref 7–25)
Creat: 1.89 mg/dL — ABNORMAL HIGH (ref 0.70–1.11)
Glucose, Bld: 105 mg/dL — ABNORMAL HIGH (ref 65–99)
Potassium: 4 mmol/L (ref 3.5–5.3)

## 2015-07-12 NOTE — Telephone Encounter (Signed)
Spoke w/Leta @ patient's PCP office Patient is in the office for an evaluation He has had frequent falls - 2 in the last day He is very SOB w/minimal exertion per Leta O2 94-95% BP good They will draw BMP, CBC today Patient's PCP would like patient seen today (preferably Dr. Stanford Breed, notified them he is out of office) - OK to see NP/PA asap.   Thomes Dinning scheduled patient to see Kathleen Argue, PA @ 230pm @ [address]  Returned call to [place] @ PCP office. She will make patient aware of appt date/time/location.

## 2015-07-12 NOTE — Assessment & Plan Note (Signed)
Dermabond applied.

## 2015-07-12 NOTE — Assessment & Plan Note (Signed)
Appointment today with cardiology. Having significant increased shortness of breath, there was some perivalvular leak on the most recent echocardiogram. Euvolemic today.

## 2015-07-12 NOTE — Patient Instructions (Addendum)
Medication Instructions:  Your physician recommends that you continue on your current medications as directed. Please refer to the Current Medication list given to you today. Labwork: TODAY PT/INR Testing/Procedures: Your physician has requested that you have a cardiac catheterization. Cardiac catheterization is used to diagnose and/or treat various heart conditions. Doctors may recommend this procedure for a number of different reasons. The most common reason is to evaluate chest pain. Chest pain can be a symptom of coronary artery disease (CAD), and cardiac catheterization can show whether plaque is narrowing or blocking your heart's arteries. This procedure is also used to evaluate the valves, as well as measure the blood flow and oxygen levels in different parts of your heart. For further information please visit [email or web address]. Please follow instruction sheet, as given. CHEST X-RAY TODAY AT [facility] [place] Follow-Up: 07/22/15 @ 12:15 WITH SCOTT WEAVER, PAC POST CATH FOLLOW UP  KEEP YOUR APPT WITH DR. CRENSHAW IN July 2017 Any Other Special Instructions Will Be Listed Below (If Applicable). If you need a refill on your cardiac medications before your next appointment, please call your pharmacy.

## 2015-07-12 NOTE — Progress Notes (Signed)
Cardiology Office Note:    Date:  07/12/2015   ID:  Eric Buchanan, DOB Feb 27, 1925, MRN [id number]  PCP:  Aundria Mems, MD  Cardiologist:  Dr. [name]   Electrophysiologist:  n/a TAVR:  Dr. Sherren Mocha  Pulmonology:  Dr. Tora Perches [facility])  Referring MD: Silverio Decamp,*   Chief Complaint  Patient presents with  . Shortness of Breath    History of Present Illness:     Eric Buchanan is a 82 y.o. male with a hx of CAD s/p CABG, aortic stenosis s/p TAVR via transfemoral approach in 5/16, HTN, HL, diastolic HF, COPD.  LHC in 4/16 prior to his TAVR demonstrated severe 3 vessel CAD and patent LIMA-LAD and patent SVG-OM2. There was at least moderate stenosis of the SVG-OM2. There were no good revascularization options because of the diffuse nature of his CAD and distal vessel location.  Admitted to [facility] in 6/16 with acute diastolic CHF and NSTEMI.  MI was treated medically.  He was admitted again in 6/16 to [facility] with hemoptysis and Plavix was DC'd.  Last seen by Dr. [name] in 11/16.    Admitted in 5/17 for pneumonia at [facility].  Recently seen by Dr. Burt Knack 06/27/15 for 1 year follow-up status post TAVR. Echo demonstrated normally functioning TAVR with mild perivalvular AI and mild mitral regurgitation that appeared worse than previous echo. Patient was volume overloaded at that visit and his Lasix was increased to 40 mg twice a day. Follow-up was arranged with Dr. Stanford Breed 08/10/15.  Patient was seen at his primary care doctor's office today and was noted to have significant shortness of breath with minimal activity and frequent falls in the last day. His PCP requested follow-up today and he is added on to my schedule for further evaluation.  Here with his daughter.  He notes his breathing has been significantly worse for several weeks now. He denies any improvement with his breathing since increasing his Lasix at the visit with Dr. Burt Knack.  He  notes dyspnea with minimal activity.  He denies chest pain, orthopnea, PND.  He has chronic R leg edema.  This is unchanged.  He denies syncope.  He fell twice at home.  He describes losing his balance.  Denies near syncope.    Past Medical History  Diagnosis Date  . Hypertension   . Asthma   . Gout   . Hyperlipidemia   . Aortic stenosis   . CAD (coronary artery disease)   . Inguinal hernia, right 05/15/2013    "they couldn't do OR"  . COPD (chronic obstructive pulmonary disease) ([place]) 10/11/2011  . Coronary artery disease involving native coronary artery   . Coronary artery disease involving coronary bypass graft 05/10/2014  . Zenker's diverticulum 05/21/2014  . Chronic combined systolic and diastolic congestive heart failure ([place])   . Mitral regurgitation   . Anginal pain ([place])   . Shortness of breath dyspnea   . GERD (gastroesophageal reflux disease)   . 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 07/02/2014 TAVR: 23 mm Edwards Sapien 3 transcatheter heart valve placed via open right transfemoral approach;  b. 07/03/2014 Echo: EF 50-55%, no central or paravalvular leak, nl transaortic gradients, mod MR, mildly dil LA, mildly reduced RV fxn, PASP 46 mmHg.  Marland Kitchen Heart murmur   . Stroke [facility]) 1965    denies residual on 07/30/2014  . Arthritis     "all over"  . Chronic neck pain   .  Chronic pain of left knee   . Chronic kidney disease (CKD)   . Skin cancer     REMOVED L ELBOW  . NSTEMI (non-ST elevated myocardial infarction) ([place]) 07/30/2014    Past Surgical History  Procedure Laterality Date  . Left and right heart catheterization with coronary/graft angiogram N/A 05/10/2014    Procedure: LEFT AND RIGHT HEART CATHETERIZATION WITH Beatrix Fetters;  Surgeon: Sherren Mocha, MD;  Location: [facility] CATH LAB;  Service: Cardiovascular;  Laterality: N/A;  . Tee without cardioversion N/A 05/17/2014    Procedure: TRANSESOPHAGEAL ECHOCARDIOGRAM (TEE);  Surgeon: Lelon Perla, MD;  Location: [facility] ENDOSCOPY;  Service: Cardiovascular;  Laterality: N/A;  . Transcatheter aortic valve replacement, transfemoral N/A 07/02/2014    Procedure: TRANSCATHETER AORTIC VALVE REPLACEMENT, TRANSFEMORAL;  Surgeon: Sherren Mocha, MD;  Location: [place];  Service: Open Heart Surgery;  Laterality: N/A;  . Tee without cardioversion N/A 07/02/2014    Procedure: TRANSESOPHAGEAL ECHOCARDIOGRAM (TEE);  Surgeon: Sherren Mocha, MD;  Location: [place];  Service: Open Heart Surgery;  Laterality: N/A;  . Cardiac valve replacement    . Cardiac catheterization      "I've had 2-3 cardiac caths"  . Cataract extraction      "? side; ? lens placed"  . Coronary artery bypass graft  01/06/1997    LIMA to LAD, SVG to OM, open SVG harvest right lower leg - [facility]  . Skin cancer excision Left     "elbow"    Current Medications: Outpatient Prescriptions Prior to Visit  Medication Sig Dispense Refill  . albuterol (PROVENTIL HFA;VENTOLIN HFA) 108 (90 BASE) MCG/ACT inhaler Inhale 1 puff into the lungs every 4 (four) hours as needed for shortness of breath.     . allopurinol (ZYLOPRIM) 100 MG tablet TAKE 1 TABLET BY MOUTH EVERY DAY 90 tablet 2  . aspirin 81 MG tablet Take 81 mg by mouth at bedtime.     Marland Kitchen atorvastatin (LIPITOR) 40 MG tablet TAKE 1 TABLET (40 MG TOTAL) BY MOUTH DAILY. 30 tablet 0  . budesonide-formoterol (SYMBICORT) 160-4.5 MCG/ACT inhaler Inhale 2 puffs into the lungs every morning. Inhale 1 puff in the evening    . fluticasone (FLONASE) 50 MCG/ACT nasal spray Place 1 spray into both nostrils daily.    . furosemide (LASIX) 40 MG tablet Take 1 tablet (40 mg total) by mouth 2 (two) times daily. 60 tablet 2  . isosorbide mononitrate (IMDUR) 60 MG 24 hr tablet Take 1 tablet (60 mg total) by mouth daily. 30 tablet 11  . metoprolol tartrate (LOPRESSOR) 25 MG tablet TAKE 1 TABLET (25 MG TOTAL) BY MOUTH 2 (TWO) TIMES DAILY. 60 tablet 6  . mirtazapine (REMERON) 30 MG tablet Take 1  tablet (30 mg total) by mouth at bedtime. 30 tablet 11  . oxyCODONE-acetaminophen (PERCOCET/ROXICET) 5-325 MG tablet Take 0.5 tablets by mouth every 8 (eight) hours as needed for moderate pain or severe pain.    . pregabalin (LYRICA) 75 MG capsule Take 1 capsule (75 mg total) by mouth 2 (two) times daily. 60 capsule 3  . Tiotropium Bromide Monohydrate (SPIRIVA RESPIMAT) 2.5 MCG/ACT AERS Inhale 2.5 mg into the lungs every morning.    . megestrol (MEGACE) 400 MG/10ML suspension Take 20 mLs (800 mg total) by mouth daily. (Patient not taking: Reported on 07/12/2015) 240 mL 0  . metoprolol tartrate (LOPRESSOR) 25 MG tablet Take 12.5 mg by mouth 2 (two) times daily. Reported on 07/12/2015 60 tablet 6  . oxyCODONE-acetaminophen (PERCOCET/ROXICET) 5-325 MG  tablet Take 0.5 tablets by mouth every 8 (eight) hours as needed. (Patient not taking: Reported on 07/12/2015) 45 tablet 0   No facility-administered medications prior to visit.      Allergies:   Clopidogrel bisulfate; Beta adrenergic blockers; and Hydrocodone   Social History   Social History  . Marital Status: Widowed    Spouse Name: N/A  . Number of Children: 3  . Years of Education: N/A   Social History Main Topics  . Smoking status: Former Smoker -- 3.00 packs/day for 20 years    Types: Cigarettes    Quit date: 02/06/1963  . Smokeless tobacco: Never Used  . Alcohol Use: 4.2 oz/week    0 Standard drinks or equivalent, 7 Cans of beer per week  . Drug Use: No  . Sexual Activity: Not Currently   Other Topics Concern  . None   Social History Narrative     Family History:  The patient's family history includes Heart attack in his brother and mother.   ROS:   Please see the history of present illness.    Review of Systems  Constitution: Positive for malaise/fatigue.  HENT: Positive for hearing loss.   Eyes: Positive for visual disturbance.  Cardiovascular: Positive for dyspnea on exertion and leg swelling.  Respiratory: Positive for  cough and wheezing.   Hematologic/Lymphatic: Bruises/bleeds easily.  Musculoskeletal: Positive for joint pain and joint swelling.  Neurological: Positive for dizziness and loss of balance.   All other systems reviewed and are negative.   Physical Exam:    VS:  BP 128/64 mmHg  Pulse 92  Ht 5\' 7"  (1.702 m)  Wt 138 lb 3.2 oz (62.687 kg)  BMI 21.64 kg/m2  SpO2 97%   Physical Exam  Constitutional: He is oriented to person, place, and time. He appears well-developed and well-nourished.  Elderly male  HENT:  Head: Normocephalic and atraumatic.  Neck: Normal range of motion. No JVD present. No thyromegaly present.  Cardiovascular: Normal rate, S1 normal and S2 normal.  A regularly irregular rhythm present.  Murmur heard.  Holosystolic murmur is present with a grade of 2/6  at the apex Pulmonary/Chest: He has decreased breath sounds. He has no wheezes. He has no rales.  Abdominal: Soft. Bowel sounds are normal. He exhibits no distension. There is no tenderness.  Musculoskeletal: Normal range of motion.  Trace bilateral ankle edema  Neurological: He is alert and oriented to person, place, and time.  Skin: Skin is warm and dry.  Psychiatric: He has a normal mood and affect.    Wt Readings from Last 3 Encounters:  07/12/15 138 lb 3.2 oz (62.687 kg)  07/12/15 138 lb 12.8 oz (62.959 kg)  06/27/15 138 lb 1.9 oz (62.651 kg)      Studies/Labs Reviewed:     EKG:  EKG is  ordered today.  The ekg ordered today demonstrates NSR with probable multiple atrial foci, PVC, HR 92, LAD, nonspecific ST-T wave changes, QTc 482 ms  Recent Labs: 11/03/2014: Hemoglobin 13.0; Platelets 233; TSH 1.581 12/22/2014: Brain Natriuretic Peptide 116.8*; BUN 36*; Creat 1.44*; Potassium 4.1; Sodium 139   Recent Lipid Panel    Component Value Date/Time   CHOL 128 05/07/2014 0855   TRIG 80 05/07/2014 0855   HDL 60 05/07/2014 0855   CHOLHDL 2.1 05/07/2014 0855   VLDL 16 05/07/2014 0855   LDLCALC 52  05/07/2014 0855    Additional studies/ records that were reviewed today include:   Echo 06/27/15 Mild concentric LVH, EF  55-60%, normal wall motion, normal functioning TAVR, mild perivalvular regurgitation, mean gradient 14 mmHg, mild MR, mild LAE  Echo 9/16 Mild concentric LVH, EF 55-60%, normal wall motion, grade 2 diastolic dysfunction, TAVR with mean gradient 20 mmHg and mild perivalvular regurgitation, mild MR, mild LAE, PASP 35 mmHg  Carotid US 8/16 No hemodynamically significant ICA stenosis bilaterally, left vertebral artery waveforms suggestive of subclavian steal  LHC 4/16 LM 95% distal  LAD totally occluded proximally. LCx 95% ostial stenosis, diffuse 80-90% mid left circumflex stenosis. The obtuse marginal branches are occluded.  RCA mid 80%.  PDA multiple sequential 90-95% stenoses. The vessel is small beyond the area of severe stenosis. SVG-OM 2 patent, mid body 75%; beyond graft insertion native OM2 95% LIMA-LAD patent, apical 80% Final Conclusions:  1. Severe three-vessel coronary artery disease 2. Status post aortocoronary bypass surgery with continued patency of the LIMA to LAD and saphenous vein graft to OM 2 3. Severe aortic stenosis 4. Known moderate to severe mitral regurgitation based on echo findings 5. Moderate point hypertension, likely related to left heart disease   ASSESSMENT:     1. Shortness of breath   2. Coronary artery disease involving native coronary artery of native heart with angina pectoris ([place])   3. Chronic diastolic congestive heart failure ([place])   4. S/P TAVR (transcatheter aortic valve replacement)   5. Chronic bronchitis, unspecified chronic bronchitis type ([place])   6. CKD (chronic kidney disease), stage III   7. Mitral regurgitation   8. Essential hypertension     PLAN:     In order of problems listed above:  1. Dyspnea - Etiology of his dyspnea not entirely clear.  He has been on higher dose diuretics for 2 weeks without much  change in his symptoms.  He does not appear to be volume overloaded on exam.  He has a hx of COPD and has been wheezing some more.  His O2 sats are good.  He denies chest pain but does note a prior hx of dyspnea prior to his CABG.  I did ask him if these symptoms remind him of his prior angina and he thinks that it does.  His LHC in 4/16 demonstrated patent grafts but he did have 75% stenosis in the mid body of the S-OM2.  There was also 80% apical LAD stenosis.  He also had a recent bout of pneumonia.  CBC, BMET are currently pending.  Lung exam is unremarkable.  Recent echo demonstrated stable TAVR with mild perivalvular leak and mild MR.  I reviewed his case with Dr. Burt Knack today.  I am most concerned about progression of diease in his S-OM2 contributing to his symptoms.  I suggested proceeding with R/L heart cath.  Dr. Burt Knack agreed.  Risks and benefits of cardiac catheterization have been discussed with the patient.  These include bleeding, infection, kidney damage, stroke, heart attack, death.  The patient understands these risks and is willing to proceed.   -  Arrange R/L heart cath  -  Arrange FU CXR  2. CAD - As noted, he has had progression of dyspnea with exertion.  Question if this is an anginal equivalent.  Will arrange R/L heart cath.  Continue ASA, Lipitor 40, Isosorbide MN 30, Metoprolol Tartrate 25 bid.  3. Chronic diastolic CHF - Volume appears stable on exam today.  Continue current Rx. Repeat BMET pending today.   4. S/p TAVR - Recent echo with stable findings.  Continue SBE prophylaxis.   5. COPD -  He has multifocal atrial foci on his ECG.  He is on Albuterol prn and Symbicort.  If R/L heart cath demonstrates stable findings, will likely need earlier FU with Pulmonology.    6. CKD - Recent Creatinines stable.  SCr on 06/06/15 1.2  Repeat BMET pending from today.    7. Mitral Regurgitation - Mild by recent echo.  8. HTN - Controlled.     Medication Adjustments/Labs and Tests  Ordered: Current medicines are reviewed at length with the patient today.  Concerns regarding medicines are outlined above.  Medication changes, Labs and Tests ordered today are outlined in the Patient Instructions noted below. Patient Instructions  Medication Instructions:  Your physician recommends that you continue on your current medications as directed. Please refer to the Current Medication list given to you today. Labwork: TODAY PT/INR Testing/Procedures: Your physician has requested that you have a cardiac catheterization. Cardiac catheterization is used to diagnose and/or treat various heart conditions. Doctors may recommend this procedure for a number of different reasons. The most common reason is to evaluate chest pain. Chest pain can be a symptom of coronary artery disease (CAD), and cardiac catheterization can show whether plaque is narrowing or blocking your heart's arteries. This procedure is also used to evaluate the valves, as well as measure the blood flow and oxygen levels in different parts of your heart. For further information please visit [facility]. Please follow instruction sheet, as given. CHEST X-RAY TODAY AT [facility] Follow-Up: 07/22/15 @ 12:15 WITH Senon Nixon, PAC POST CATH FOLLOW UP  KEEP YOUR APPT WITH DR. CRENSHAW IN July 2017 Any Other Special Instructions Will Be Listed Below (If Applicable). If you need a refill on your cardiac medications before your next appointment, please call your pharmacy.    Signed, Richardson Dopp, PA-C  07/12/2015 4:09 PM    [facility] [place], [place], [place]  [address] Phone: [phone]; Fax: [phone]

## 2015-07-12 NOTE — Telephone Encounter (Signed)
Message routed to Dr. Stanford Breed as Juluis Rainier

## 2015-07-12 NOTE — Telephone Encounter (Signed)
Please see OV note from today. Richardson Dopp, PA-C   07/12/2015 4:15 PM

## 2015-07-12 NOTE — Assessment & Plan Note (Signed)
Having some weakness and unsteadiness, checking routine blood work.

## 2015-07-12 NOTE — Progress Notes (Signed)
  Subjective:    CC: arm laceration, shortness of breath, unsteadiness  HPI: This is a pleasant 82 year old male, he is postcatheter aortic valve replacement with a bit of leak around the valve. More recently he's had increasing shortness of breath, unsteadiness. Denies any chest pain, no orthopnea or PND. No increase in swelling in his legs. Unfortunately he did fall and has a small laceration on his right forearm. Symptoms are mild, persistent.  Past medical history, Surgical history, Family history not pertinant except as noted below, Social history, Allergies, and medications have been entered into the medical record, reviewed, and no changes needed.   Review of Systems: No fevers, chills, night sweats, weight loss, chest pain, or shortness of breath.   Objective:    General: Well Developed, well nourished, and in no acute distress.  Neuro: Alert and oriented x3, extra-ocular muscles intact, sensation grossly intact.  HEENT: Normocephalic, atraumatic, pupils equal round reactive to light, neck supple, no masses, no lymphadenopathy, thyroid nonpalpable.  Skin: Warm and dry, no rashes. Cardiac: Regular rate and rhythm, no murmurs rubs or gallops, no lower extremity edema.  Respiratory: Clear to auscultation bilaterally. Not using accessory muscles, speaking in full sentences. Right arm: 6 cm laceration into the subcutaneous tissues.  Laceration repair: Indication: bleeding Location: right forearm Size: 6 cm Anesthesia: none needed Wound explored, irrigated, FB removed (if present) No sutures needed, Dermabond applied. Tolerated well Routine postprocedure instructions d/w pt- keep area clean and bandaged, follow up if concerns/spreading erythema/pain.    Impression and Recommendations:

## 2015-07-13 ENCOUNTER — Other Ambulatory Visit: Payer: Self-pay | Admitting: Sports Medicine

## 2015-07-13 ENCOUNTER — Telehealth: Payer: Self-pay

## 2015-07-13 LAB — PROTIME-INR
INR: 1.5 — ABNORMAL HIGH (ref <1.50)
PROTHROMBIN TIME: 18.3 s — AB (ref 11.6–15.2)

## 2015-07-13 MED ORDER — DOXYCYCLINE HYCLATE 100 MG PO TABS: 100.0000 mg | ORAL_TABLET | Freq: Two times a day (BID) | ORAL | Status: DC

## 2015-07-13 MED ORDER — AZITHROMYCIN 250 MG PO TABS: ORAL_TABLET | ORAL | Status: DC

## 2015-07-13 NOTE — Assessment & Plan Note (Signed)
Lab looks good with the exception of elevated white blood cell count, with his history of COPD I am going to treat empirically with doxycycline. Also needs to increase his hydration significantly. Stop furosemide for the next 1 week and return to see me in 2 weeks.

## 2015-07-13 NOTE — Telephone Encounter (Signed)
-----   Message from Liliane Shi, [place] sent at 07/13/2015  2:54 PM EDT ----- Please inform patient that his CXR is abnormal and shows R lung pneumonia. I have spoken with Dr. Burt Knack. We have canceled his cath for tomorrow. I have left a message at his home as well as with his daughter, Hilda Blades (DPR on file). I have called Dr. Landry Corporal office. They are trying to contact him to start antibiotics. I have asked for Mr Crew to be seen with his PCP again this week for FU.  Dr. Landry Corporal office is trying to contact him. He may need a CT of the chest to better evaluate his pneumonia.  I will defer this testing to his PCP, Dr. Dianah Field. Please ask the patient to call his PCP's office to arrange FU. He can keep his scheduled OV with me as planned.  I have also forwarded this test to his PCP:  Aundria Mems, MD  Richardson Dopp, PA-C   07/13/2015 2:50 PM

## 2015-07-13 NOTE — Addendum Note (Signed)
Addended by: Silverio Decamp on: 07/13/2015 09:07 AM   Modules accepted: Orders

## 2015-07-13 NOTE — Telephone Encounter (Signed)
Left message to call back  Pt has below results from Chest X ray and needs to call back and speak with nurse about details.

## 2015-07-14 ENCOUNTER — Ambulatory Visit (HOSPITAL_COMMUNITY): Admission: RE | Admit: 2015-07-14 | Payer: Medicare Other | Source: Ambulatory Visit | Admitting: Cardiovascular Disease

## 2015-07-14 ENCOUNTER — Encounter (HOSPITAL_COMMUNITY): Admission: RE | Payer: Self-pay | Source: Ambulatory Visit

## 2015-07-14 SURGERY — RIGHT/LEFT HEART CATH AND CORONARY ANGIOGRAPHY

## 2015-07-14 NOTE — Telephone Encounter (Signed)
Provider Kathleen Argue) spoke with pt daughter on 07/14/15.

## 2015-07-19 ENCOUNTER — Encounter: Payer: Self-pay | Admitting: Sports Medicine

## 2015-07-19 ENCOUNTER — Ambulatory Visit (INDEPENDENT_AMBULATORY_CARE_PROVIDER_SITE_OTHER): Payer: Medicare Other | Admitting: Sports Medicine

## 2015-07-19 ENCOUNTER — Ambulatory Visit (HOSPITAL_BASED_OUTPATIENT_CLINIC_OR_DEPARTMENT_OTHER)
Admission: RE | Admit: 2015-07-19 | Discharge: 2015-07-19 | Disposition: A | Discharge status: Home / Self Care | Payer: Medicare Other | Source: Ambulatory Visit | Attending: Sports Medicine | Admitting: Sports Medicine

## 2015-07-19 ENCOUNTER — Encounter (HOSPITAL_BASED_OUTPATIENT_CLINIC_OR_DEPARTMENT_OTHER): Payer: Self-pay

## 2015-07-19 VITALS — BP 131/64 | HR 75 | Resp 20 | Wt 142.0 lb

## 2015-07-19 DIAGNOSIS — J42 Unspecified chronic bronchitis: Secondary | ICD-10-CM

## 2015-07-19 DIAGNOSIS — F32A Depression, unspecified: Secondary | ICD-10-CM

## 2015-07-19 DIAGNOSIS — F329 Major depressive disorder, single episode, unspecified: Secondary | ICD-10-CM

## 2015-07-19 DIAGNOSIS — I25119 Atherosclerotic heart disease of native coronary artery with unspecified angina pectoris: Secondary | ICD-10-CM

## 2015-07-19 DIAGNOSIS — R079 Chest pain, unspecified: Secondary | ICD-10-CM | POA: Diagnosis present

## 2015-07-19 DIAGNOSIS — F320 Major depressive disorder, single episode, mild: Secondary | ICD-10-CM

## 2015-07-19 MED ORDER — PREDNISONE 50 MG PO TABS: 50.0000 mg | ORAL_TABLET | Freq: Every day | ORAL | Status: DC

## 2015-07-19 MED ORDER — MOXIFLOXACIN HCL 400 MG PO TABS: 400.0000 mg | ORAL_TABLET | Freq: Every day | ORAL | Status: DC

## 2015-07-19 MED ORDER — IOPAMIDOL (ISOVUE-370) INJECTION 76%
80.0000 mL | Freq: Once | INTRAVENOUS | Status: AC | PRN
  Administered 2015-07-19: 80 mL via INTRAVENOUS

## 2015-07-19 MED ORDER — MEGESTROL ACETATE 400 MG/10ML PO SUSP: 800.0000 mg | Freq: Every day | ORAL | Status: DC

## 2015-07-19 NOTE — Assessment & Plan Note (Signed)
Continue Symbicort, Spiriva. Still with air hunger however O2 sat duration at 96%. Prednisone, switching to Avelox. We are going to also get a CT angiogram of the chest as he is having some chest tightness and shortness of breath. GFR is greater than 30, and I would like him to get a liter of normal saline with his contrast. I suspect his air hunger is multifactorial related to COPD, possible pneumonia, and valvular leak

## 2015-07-19 NOTE — Progress Notes (Signed)
Left detailed message with results

## 2015-07-19 NOTE — Progress Notes (Signed)
  Subjective:    CC: Follow-up  HPI: Shortness of breath: Persistent, pneumonia noted on a recent x-ray, recommended follow-up CT. O2 saturation has remained normal, vital signs have remained stable, but he continues to complain of air hunger. He does have COPD as well as is post percutaneous aortic valve replacement with some perivalvular leakage. We have not yet done CT angiogram for pulmonary malaise. He's finished his azithromycin, and hasn't noted much improvement. Tells me he is compliant with his Symbicort and Spiriva. No leg swelling. No orthopnea. No cough.  Past medical history, Surgical history, Family history not pertinant except as noted below, Social history, Allergies, and medications have been entered into the medical record, reviewed, and no changes needed.   Review of Systems: No fevers, chills, night sweats, weight loss, chest pain, or shortness of breath.   Objective:    General: Well Developed, well nourished, and in no acute distress.  Neuro: Alert and oriented x3, extra-ocular muscles intact, sensation grossly intact.  HEENT: Normocephalic, atraumatic, pupils equal round reactive to light, neck supple, no masses, no lymphadenopathy, thyroid nonpalpable.  Skin: Warm and dry, no rashes. Cardiac: Regular rate and rhythm, no murmurs rubs or gallops, no lower extremity edema.  Respiratory: Not using accessory muscles, speaking in full sentences. Minimally coarse sounds in the left lung base   Impression and Recommendations:   I spent 25 minutes with this patient, greater than 50% was face-to-face time counseling regarding the above diagnoses

## 2015-07-21 NOTE — Progress Notes (Signed)
Cardiology Office Note:    Date:  07/21/2015   ID:  Eric Buchanan, DOB Nov 24, 1925, MRN [id number]  PCP:  Aundria Mems, MD  Cardiologist: Dr. [name]  Electrophysiologist: n/a TAVR: Dr. Sherren Mocha  Pulmonology: Dr. Tora Perches [facility])  Referring MD: Silverio Decamp,*   Chief Complaint  Patient presents with  . Follow-up    Shortness of breath    History of Present Illness:     Eric Buchanan is a 82 y.o. male with a hx of CAD s/p CABG, aortic stenosis s/p TAVR via transfemoral approach in 5/16, HTN, HL, diastolic HF, COPD.  LHC in 4/16 prior to his TAVR demonstrated severe 3 vessel CAD and patent LIMA-LAD and patent SVG-OM2. There was at least moderate stenosis of the SVG-OM2. There were no good revascularization options because of the diffuse nature of his CAD and distal vessel location.  Admitted to [facility] in 6/16 with acute diastolic CHF and NSTEMI. MI was treated medically. He was admitted again in 6/16 to [facility] with hemoptysis and Plavix was DC'd. Last seen by Dr. [name] in 11/16.   Admitted in 5/17 for pneumonia at [facility].  Recently seen by Dr. Burt Knack 06/27/15 for 1 year follow-up status post TAVR. Echo demonstrated normally functioning TAVR with mild perivalvular AI and mild mitral regurgitation that appeared worse than previous echo. Patient was volume overloaded at that visit and his Lasix was increased to 40 mg twice a day. Follow-up was arranged with Dr. Stanford Breed 08/10/15.  Patient was seen at his primary care doctor's office today and was noted to have significant shortness of breath with minimal activity and frequent falls in the last day. His PCP requested follow-up today and he is added on to my schedule for further evaluation.  Here with his daughter. He notes his breathing has been significantly worse for several weeks now. He denies any improvement with his breathing since increasing his Lasix at the visit with Dr.  Burt Knack. He notes dyspnea with minimal activity. He denies chest pain, orthopnea, PND. He has chronic R leg edema. This is unchanged. He denies syncope. He fell twice at home. He describes losing his balance. Denies near syncope.    Past Medical History  Diagnosis Date  . Hypertension   . Asthma   . Gout   . Hyperlipidemia   . Aortic stenosis   . CAD (coronary artery disease)   . Inguinal hernia, right 05/15/2013    "they couldn't do OR"  . COPD (chronic obstructive pulmonary disease) ([place]) 10/11/2011  . Coronary artery disease involving native coronary artery   . Coronary artery disease involving coronary bypass graft 05/10/2014  . Zenker's diverticulum 05/21/2014  . Chronic combined systolic and diastolic congestive heart failure ([place])   . Mitral regurgitation   . Anginal pain ([place])   . Shortness of breath dyspnea   . GERD (gastroesophageal reflux disease)   . 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 07/02/2014 TAVR: 23 mm Edwards Sapien 3 transcatheter heart valve placed via open right transfemoral approach;  b. 07/03/2014 Echo: EF 50-55%, no central or paravalvular leak, nl transaortic gradients, mod MR, mildly dil LA, mildly reduced RV fxn, PASP 46 mmHg.  Marland Kitchen Heart murmur   . Stroke [facility]) 1965    denies residual on 07/30/2014  . Arthritis     "all over"  . Chronic neck pain   . Chronic pain of left knee   . Chronic kidney disease (CKD)   .  Skin cancer     REMOVED L ELBOW  . NSTEMI (non-ST elevated myocardial infarction) ([place]) 07/30/2014    Past Surgical History  Procedure Laterality Date  . Left and right heart catheterization with coronary/graft angiogram N/A 05/10/2014    Procedure: LEFT AND RIGHT HEART CATHETERIZATION WITH Beatrix Fetters;  Surgeon: Sherren Mocha, MD;  Location: [facility] CATH LAB;  Service: Cardiovascular;  Laterality: N/A;  . Tee without cardioversion N/A 05/17/2014    Procedure: TRANSESOPHAGEAL ECHOCARDIOGRAM (TEE);  Surgeon:  Lelon Perla, MD;  Location: [facility] ENDOSCOPY;  Service: Cardiovascular;  Laterality: N/A;  . Transcatheter aortic valve replacement, transfemoral N/A 07/02/2014    Procedure: TRANSCATHETER AORTIC VALVE REPLACEMENT, TRANSFEMORAL;  Surgeon: Sherren Mocha, MD;  Location: [place];  Service: Open Heart Surgery;  Laterality: N/A;  . Tee without cardioversion N/A 07/02/2014    Procedure: TRANSESOPHAGEAL ECHOCARDIOGRAM (TEE);  Surgeon: Sherren Mocha, MD;  Location: [place];  Service: Open Heart Surgery;  Laterality: N/A;  . Cardiac valve replacement    . Cardiac catheterization      "I've had 2-3 cardiac caths"  . Cataract extraction      "? side; ? lens placed"  . Coronary artery bypass graft  01/06/1997    LIMA to LAD, SVG to OM, open SVG harvest right lower leg - [facility]  . Skin cancer excision Left     "elbow"    Current Medications: Outpatient Prescriptions Prior to Visit  Medication Sig Dispense Refill  . albuterol (PROVENTIL HFA;VENTOLIN HFA) 108 (90 BASE) MCG/ACT inhaler Inhale 1 puff into the lungs every 4 (four) hours as needed for shortness of breath.     . allopurinol (ZYLOPRIM) 100 MG tablet TAKE 1 TABLET BY MOUTH EVERY DAY 90 tablet 2  . aspirin 81 MG tablet Take 81 mg by mouth at bedtime.     Marland Kitchen atorvastatin (LIPITOR) 40 MG tablet TAKE 1 TABLET (40 MG TOTAL) BY MOUTH DAILY. 30 tablet 0  . azithromycin (ZITHROMAX) 250 MG tablet TAKE 2 TABLETS (500 MG) BY MOUTH ON DAY ONE, THEN 1 TABLET(250 MG) DAILY BY MOUTH (Patient not taking: Reported on 07/19/2015) 6 tablet 0  . budesonide-formoterol (SYMBICORT) 160-4.5 MCG/ACT inhaler Inhale 2 puffs into the lungs every morning. Inhale 1 puff in the evening    . fluticasone (FLONASE) 50 MCG/ACT nasal spray Place 1 spray into both nostrils daily.    . furosemide (LASIX) 40 MG tablet Take 1 tablet (40 mg total) by mouth 2 (two) times daily. 60 tablet 2  . isosorbide mononitrate (IMDUR) 60 MG 24 hr tablet Take 1 tablet (60 mg total) by  mouth daily. 30 tablet 11  . megestrol (MEGACE) 400 MG/10ML suspension Take 20 mLs (800 mg total) by mouth daily. 240 mL 0  . metoprolol tartrate (LOPRESSOR) 25 MG tablet TAKE 1 TABLET (25 MG TOTAL) BY MOUTH 2 (TWO) TIMES DAILY. 60 tablet 6  . mirtazapine (REMERON) 30 MG tablet Take 1 tablet (30 mg total) by mouth at bedtime. 30 tablet 11  . moxifloxacin (AVELOX) 400 MG tablet Take 1 tablet (400 mg total) by mouth daily. 10 tablet 0  . oxyCODONE-acetaminophen (PERCOCET/ROXICET) 5-325 MG tablet Take 0.5 tablets by mouth every 8 (eight) hours as needed for moderate pain or severe pain.    . predniSONE (DELTASONE) 50 MG tablet Take 1 tablet (50 mg total) by mouth daily. 5 tablet 0  . pregabalin (LYRICA) 75 MG capsule Take 1 capsule (75 mg total) by mouth 2 (two) times daily. 60 capsule  3  . Tiotropium Bromide Monohydrate (SPIRIVA RESPIMAT) 2.5 MCG/ACT AERS Inhale 2.5 mg into the lungs every morning.     No facility-administered medications prior to visit.      Allergies:   Clopidogrel bisulfate; Beta adrenergic blockers; and Hydrocodone   Social History   Social History  . Marital Status: Widowed    Spouse Name: N/A  . Number of Children: 3  . Years of Education: N/A   Social History Main Topics  . Smoking status: Former Smoker -- 3.00 packs/day for 20 years    Types: Cigarettes    Quit date: 02/06/1963  . Smokeless tobacco: Never Used  . Alcohol Use: 4.2 oz/week    0 Standard drinks or equivalent, 7 Cans of beer per week  . Drug Use: No  . Sexual Activity: Not Currently   Other Topics Concern  . Not on file   Social History Narrative     Family History:  The patient's family history includes Heart attack in his brother and mother.   ROS:   Please see the history of present illness.    ROS All other systems reviewed and are negative.   Physical Exam:    VS:  There were no vitals taken for this visit.   Physical Exam  Wt Readings from Last 3 Encounters:  07/19/15 142  lb (64.411 kg)  07/12/15 138 lb 3.2 oz (62.687 kg)  07/12/15 138 lb 12.8 oz (62.959 kg)      Studies/Labs Reviewed:     EKG:  EKG is  ordered today.  The ekg ordered today demonstrates   Recent Labs: 11/03/2014: TSH 1.581 12/22/2014: Brain Natriuretic Peptide 116.8* 07/12/2015: BUN 56*; Creat 1.89*; Hemoglobin 12.9*; Platelets 260; Potassium 4.0; Sodium 137   Recent Lipid Panel    Component Value Date/Time   CHOL 128 05/07/2014 0855   TRIG 80 05/07/2014 0855   HDL 60 05/07/2014 0855   CHOLHDL 2.1 05/07/2014 0855   VLDL 16 05/07/2014 0855   LDLCALC 52 05/07/2014 0855    Additional studies/ records that were reviewed today include:   Chest CTA 07/19/15 IMPRESSION: 1. Negative for pulmonary embolism. 2. Multilobar airspace disease on the right is most consistent with pneumonia. Disease is persistent or recurrent since at least 06/14/2015 chest x-ray and close imaging follow-up is recommended to ensure expected clearing.  Echo 06/27/15 Mild concentric LVH, EF 55-60%, normal wall motion, normal functioning TAVR, mild perivalvular regurgitation, mean gradient 14 mmHg, mild MR, mild LAE  Echo 9/16 Mild concentric LVH, EF 55-60%, normal wall motion, grade 2 diastolic dysfunction, TAVR with mean gradient 20 mmHg and mild perivalvular regurgitation, mild MR, mild LAE, PASP 35 mmHg  Carotid US 8/16 No hemodynamically significant ICA stenosis bilaterally, left vertebral artery waveforms suggestive of subclavian steal  LHC 4/16 LM 95% distal  LAD totally occluded proximally. LCx 95% ostial stenosis, diffuse 80-90% mid left circumflex stenosis. The obtuse marginal branches are occluded.  RCA mid 80%. PDA multiple sequential 90-95% stenoses. The vessel is small beyond the area of severe stenosis. SVG-OM 2 patent, mid body 75%; beyond graft insertion native OM2 95% LIMA-LAD patent, apical 80% Final Conclusions:  1. Severe three-vessel coronary artery disease 2. Status post  aortocoronary bypass surgery with continued patency of the LIMA to LAD and saphenous vein graft to OM 2 3. Severe aortic stenosis 4. Known moderate to severe mitral regurgitation based on echo findings 5. Moderate point hypertension, likely related to left heart disease  ASSESSMENT:     No  diagnosis found.  PLAN:     In order of problems listed above:  1. Dyspnea - Etiology of his dyspnea not entirely clear. He has been on higher dose diuretics for 2 weeks without much change in his symptoms. He does not appear to be volume overloaded on exam. He has a hx of COPD and has been wheezing some more. His O2 sats are good. He denies chest pain but does note a prior hx of dyspnea prior to his CABG. I did ask him if these symptoms remind him of his prior angina and he thinks that it does. His LHC in 4/16 demonstrated patent grafts but he did have 75% stenosis in the mid body of the S-OM2. There was also 80% apical LAD stenosis. He also had a recent bout of pneumonia. CBC, BMET are currently pending. Lung exam is unremarkable. Recent echo demonstrated stable TAVR with mild perivalvular leak and mild MR. I reviewed his case with Dr. Burt Knack today. I am most concerned about progression of diease in his S-OM2 contributing to his symptoms. I suggested proceeding with R/L heart cath. Dr. Burt Knack agreed. Risks and benefits of cardiac catheterization have been discussed with the patient. These include bleeding, infection, kidney damage, stroke, heart attack, death. The patient understands these risks and is willing to proceed.  - Arrange R/L heart cath - Arrange FU CXR  2. CAD - As noted, he has had progression of dyspnea with exertion. Question if this is an anginal equivalent. Will arrange R/L heart cath. Continue ASA, Lipitor 40, Isosorbide MN 30, Metoprolol Tartrate 25 bid.  3. Chronic diastolic CHF - Volume appears stable on exam today. Continue current Rx.  Repeat BMET pending today.   4. S/p TAVR - Recent echo with stable findings. Continue SBE prophylaxis.   5. COPD - He has multifocal atrial foci on his ECG. He is on Albuterol prn and Symbicort. If R/L heart cath demonstrates stable findings, will likely need earlier FU with Pulmonology.   6. CKD - Recent Creatinines stable. SCr on 06/06/15 1.2 Repeat BMET pending from today.   7. Mitral Regurgitation - Mild by recent echo.  8. HTN - Controlled.    Medication Adjustments/Labs and Tests Ordered: Current medicines are reviewed at length with the patient today.  Concerns regarding medicines are outlined above.  Medication changes, Labs and Tests ordered today are outlined in the Patient Instructions noted below. There are no Patient Instructions on file for this visit. Signed, Richardson Dopp, PA-C  07/21/2015 5:39 PM    [facility] [place], [place], [place]  [address] Phone: [phone]; Fax: [phone]     This encounter was created in error - please disregard.

## 2015-07-22 ENCOUNTER — Encounter: Payer: Medicare Other | Admitting: Physician Assistant

## 2015-08-01 ENCOUNTER — Other Ambulatory Visit: Payer: Self-pay

## 2015-08-01 DIAGNOSIS — F32A Depression, unspecified: Secondary | ICD-10-CM

## 2015-08-01 DIAGNOSIS — F320 Major depressive disorder, single episode, mild: Secondary | ICD-10-CM

## 2015-08-01 MED ORDER — MEGESTROL ACETATE 400 MG/10ML PO SUSP: 800.0000 mg | Freq: Every day | ORAL | Status: DC

## 2015-08-02 ENCOUNTER — Encounter: Payer: Self-pay | Admitting: Sports Medicine

## 2015-08-02 ENCOUNTER — Ambulatory Visit (INDEPENDENT_AMBULATORY_CARE_PROVIDER_SITE_OTHER): Payer: Medicare Other | Admitting: Sports Medicine

## 2015-08-02 ENCOUNTER — Other Ambulatory Visit: Payer: Self-pay | Admitting: Sports Medicine

## 2015-08-02 ENCOUNTER — Other Ambulatory Visit: Payer: Self-pay | Admitting: Cardiovascular Disease

## 2015-08-02 VITALS — BP 149/67 | HR 74 | Resp 22 | Wt 144.3 lb

## 2015-08-02 DIAGNOSIS — I25119 Atherosclerotic heart disease of native coronary artery with unspecified angina pectoris: Secondary | ICD-10-CM | POA: Diagnosis not present

## 2015-08-02 DIAGNOSIS — J42 Unspecified chronic bronchitis: Secondary | ICD-10-CM | POA: Diagnosis not present

## 2015-08-02 DIAGNOSIS — M1712 Unilateral primary osteoarthritis, left knee: Secondary | ICD-10-CM | POA: Diagnosis not present

## 2015-08-02 NOTE — Progress Notes (Signed)
HPI: FU AVR; hx of CAD s/p CABG, aortic stenosis s/p TAVR via transfemoral approach in 5/16, HTN, HL, diastolic HF, COPD.  LHC in 4/16 prior to his TAVR demonstrated severe 3 vessel CAD and patent LIMA-LAD and patent SVG-OM2. There was at least moderate stenosis of the SVG-OM2. There were no good revascularization options because of the diffuse nature of his CAD and distal vessel location. Echo 5/17 demonstrated normally functioning TAVR (mean gradient 14 mmHg) with mild perivalvular AI and mild mitral regurgitation that appeared worse than previous echo.  Recently seen with increased dyspnea despite increased dose of lasix; cath planned but cancelled; found to have pneumonia; CTA 6/17 showed no pulmonary embolus; There is note of multilobar airspace disease on the right suggestive of pneumonia. Since last seen, He still has dyspnea on exertion. No orthopnea, PND, pedal edema, chest pain, palpitations, fevers, chills or productive cough.  Current Outpatient Prescriptions  Medication Sig Dispense Refill  . albuterol (PROVENTIL HFA;VENTOLIN HFA) 108 (90 BASE) MCG/ACT inhaler Inhale 1 puff into the lungs every 4 (four) hours as needed for shortness of breath.     . allopurinol (ZYLOPRIM) 100 MG tablet TAKE 1 TABLET BY MOUTH EVERY DAY 90 tablet 2  . aspirin 81 MG tablet Take 81 mg by mouth at bedtime.     Marland Kitchen atorvastatin (LIPITOR) 40 MG tablet TAKE 1 TABLET (40 MG TOTAL) BY MOUTH DAILY. 30 tablet 0  . budesonide-formoterol (SYMBICORT) 160-4.5 MCG/ACT inhaler Inhale 2 puffs into the lungs every morning. Inhale 1 puff in the evening    . fluticasone (FLONASE) 50 MCG/ACT nasal spray Place 1 spray into both nostrils daily.    . furosemide (LASIX) 40 MG tablet TAKE 1 TABLET (40 MG TOTAL) BY MOUTH 2 (TWO) TIMES DAILY. 180 tablet 3  . isosorbide mononitrate (IMDUR) 60 MG 24 hr tablet TAKE 1 TABLET (60 MG TOTAL) BY MOUTH DAILY. 30 tablet 11  . megestrol (MEGACE) 400 MG/10ML suspension Take 20 mLs (800  mg total) by mouth daily. 240 mL 5  . metoprolol tartrate (LOPRESSOR) 25 MG tablet TAKE 1 TABLET (25 MG TOTAL) BY MOUTH 2 (TWO) TIMES DAILY. 60 tablet 6  . mirtazapine (REMERON) 30 MG tablet Take 1 tablet (30 mg total) by mouth at bedtime. 30 tablet 11  . oxyCODONE-acetaminophen (PERCOCET/ROXICET) 5-325 MG tablet Take 0.5 tablets by mouth every 8 (eight) hours as needed for moderate pain or severe pain.    . pregabalin (LYRICA) 75 MG capsule Take 1 capsule (75 mg total) by mouth 2 (two) times daily. 60 capsule 3  . Tiotropium Bromide Monohydrate (SPIRIVA RESPIMAT) 2.5 MCG/ACT AERS Inhale 2.5 mg into the lungs every morning.     No current facility-administered medications for this visit.     Past Medical History  Diagnosis Date  . Hypertension   . Asthma   . Gout   . Hyperlipidemia   . Aortic stenosis   . CAD (coronary artery disease)   . Inguinal hernia, right 05/15/2013    "they couldn't do OR"  . COPD (chronic obstructive pulmonary disease) ([place]) 10/11/2011  . Coronary artery disease involving native coronary artery   . Coronary artery disease involving coronary bypass graft 05/10/2014  . Zenker's diverticulum 05/21/2014  . Chronic combined systolic and diastolic congestive heart failure ([place])   . Mitral regurgitation   . Anginal pain ([place])   . Shortness of breath dyspnea   . GERD (gastroesophageal reflux disease)   . S/P TAVR (transcatheter aortic valve  replacement) 07/02/2014    a. 07/02/2014 TAVR: 23 mm Edwards Sapien 3 transcatheter heart valve placed via open right transfemoral approach;  b. 07/03/2014 Echo: EF 50-55%, no central or paravalvular leak, nl transaortic gradients, mod MR, mildly dil LA, mildly reduced RV fxn, PASP 46 mmHg.  Marland Kitchen Heart murmur   . Stroke [facility]) 1965    denies residual on 07/30/2014  . Arthritis     "all over"  . Chronic neck pain   . Chronic pain of left knee   . Chronic kidney disease (CKD)   . Skin cancer     REMOVED L ELBOW  . NSTEMI (non-ST elevated  myocardial infarction) ([place]) 07/30/2014    Past Surgical History  Procedure Laterality Date  . Left and right heart catheterization with coronary/graft angiogram N/A 05/10/2014    Procedure: LEFT AND RIGHT HEART CATHETERIZATION WITH Beatrix Fetters;  Surgeon: Sherren Mocha, MD;  Location: [facility] CATH LAB;  Service: Cardiovascular;  Laterality: N/A;  . Tee without cardioversion N/A 05/17/2014    Procedure: TRANSESOPHAGEAL ECHOCARDIOGRAM (TEE);  Surgeon: Lelon Perla, MD;  Location: [facility] ENDOSCOPY;  Service: Cardiovascular;  Laterality: N/A;  . Transcatheter aortic valve replacement, transfemoral N/A 07/02/2014    Procedure: TRANSCATHETER AORTIC VALVE REPLACEMENT, TRANSFEMORAL;  Surgeon: Sherren Mocha, MD;  Location: [place];  Service: Open Heart Surgery;  Laterality: N/A;  . Tee without cardioversion N/A 07/02/2014    Procedure: TRANSESOPHAGEAL ECHOCARDIOGRAM (TEE);  Surgeon: Sherren Mocha, MD;  Location: [place];  Service: Open Heart Surgery;  Laterality: N/A;  . Cardiac valve replacement    . Cardiac catheterization      "I've had 2-3 cardiac caths"  . Cataract extraction      "? side; ? lens placed"  . Coronary artery bypass graft  01/06/1997    LIMA to LAD, SVG to OM, open SVG harvest right lower leg - [facility]  . Skin cancer excision Left     "elbow"    Social History   Social History  . Marital Status: Widowed    Spouse Name: N/A  . Number of Children: 3  . Years of Education: N/A   Occupational History  . Not on file.   Social History Main Topics  . Smoking status: Former Smoker -- 3.00 packs/day for 20 years    Types: Cigarettes    Quit date: 02/06/1963  . Smokeless tobacco: Never Used  . Alcohol Use: 4.2 oz/week    0 Standard drinks or equivalent, 7 Cans of beer per week  . Drug Use: No  . Sexual Activity: Not Currently   Other Topics Concern  . Not on file   Social History Narrative    Family History  Problem Relation Age of Onset  . Heart  attack Mother   . Heart attack Brother     ROS: Dyspnea on exertion and weakness but no fevers or chills, productive cough, hemoptysis, dysphasia, odynophagia, melena, hematochezia, dysuria, hematuria, rash, seizure activity, orthopnea, PND, pedal edema, claudication. Remaining systems are negative.  Physical Exam: Well-developed frail  in no acute distress.  Skin is warm and dry.  HEENT is normal.  Neck is supple.  Chest Diminished breath sounds Cardiovascular exam is regular rate and rhythm. 2/6 systolic murmur left sternal border. Abdominal exam nontender or distended. No masses palpated. Extremities show no edema. neuro grossly intact  Electrocardiogram shows sinus rhythm with PACs and PVCs. Left axis deviation. Left ventricular hypertrophy.   Assessment and plan 1 coronary artery disease-continue aspirin and statin.  2 hyperlipidemia-continue statin. 3 hypertension-continue present blood pressure medications. 4 status post transcatheter aortic valve replacement-continue SBE prophylaxis. 5 chronic diastolic congestive heart failure-patient appears to be euvolemic on examination and potentially dry. We will continue resin dose of Lasix. Check potassium and renal function as well as BNP. May need to reduce dose of Lasix. 6 dyspnea-he does not appear to be volume overloaded on examination. Check BNP. He has been treated for pneumonia recently. Recent chest CT showed infiltrate. He is scheduled to see primary care tomorrow for further evaluation. Possible contribution from COPD. [facility] for follow-up for pulmonary. I do not think we should proceed with cardiac catheterization given renal insufficiency. [name], MD

## 2015-08-02 NOTE — Assessment & Plan Note (Signed)
I think Eric Buchanan is doing well from a pulmonary standpoint, his lungs are clear, and his oxygen saturation is good. He has finished his course of Avelox, he did have a pneumonia seen on CT. Unfortunately he does continue to complain of persistent dyspnea which is likely related to his perivalvular leak. He does have a follow-up coming up with his cardiologist on the fifth. I have asked him to pay very close attention as to whether his shortness of breath gets better when he takes his oxycodone, as we may need to take somewhat of a more palliative approach.

## 2015-08-02 NOTE — Assessment & Plan Note (Signed)
starting Visco supplementation into the left knee. Return in one week for Orthovisc injection #2

## 2015-08-02 NOTE — Progress Notes (Signed)
  Subjective:    CC: Follow-up  HPI: Dyspnea: Initially thought to be multifactorial, he does have a bit of perivalvular leak around his aortic valve replacement, he also had a pneumonia at the last visit, confirmed on CT scan, he is finished a course of moxifloxacin, but unfortunately continues to complain of persistent dyspnea. No chest pain, the CT was an angiogram.  Left knee osteoarthritis: Persistently painful, wonders what the next step is. Pain is moderate, persistent, localized at the joint line. No mechanical symptoms.  Past medical history, Surgical history, Family history not pertinant except as noted below, Social history, Allergies, and medications have been entered into the medical record, reviewed, and no changes needed.   Review of Systems: No fevers, chills, night sweats, weight loss, chest pain, or shortness of breath.   Objective:    General: Well Developed, well nourished, and in no acute distress.  Neuro: Alert and oriented x3, extra-ocular muscles intact, sensation grossly intact.  HEENT: Normocephalic, atraumatic, pupils equal round reactive to light, neck supple, no masses, no lymphadenopathy, thyroid nonpalpable.  Skin: Warm and dry, no rashes. Cardiac: Regular rate and rhythm, no murmurs rubs or gallops, no lower extremity edema.  Respiratory: Clear to auscultation bilaterally. Not using accessory muscles, speaking in full sentences.  Procedure: Real-time Ultrasound Guided aspiration/Injection of left knee Device: GE Logiq E  Verbal informed consent obtained.  Time-out conducted.  Noted no overlying erythema, induration, or other signs of local infection.  Skin prepped in a sterile fashion.  Local anesthesia: Topical Ethyl chloride.  With sterile technique and under real time ultrasound guidance:  A proximally 10-20 mL of straw-colored fluid aspirated, syringe switched and 30 mg/2 mL of OrthoVisc (sodium hyaluronate) in a prefilled syringe was injected easily  into the knee through a 22-gauge needle. Completed without difficulty  Pain immediately resolved suggesting accurate placement of the medication.  Advised to call if fevers/chills, erythema, induration, drainage, or persistent bleeding.  Images permanently stored and available for review in the ultrasound unit.  Impression: Technically successful ultrasound guided injection.  Impression and Recommendations:

## 2015-08-04 ENCOUNTER — Telehealth: Payer: Self-pay | Admitting: Sports Medicine

## 2015-08-04 NOTE — Telephone Encounter (Signed)
Daughter Hilda Blades) called to see if PCP would put in an order for home care aid. Pt does not need help with ADLs, just rides to the store and out to eat. Advised Daughter this sounds like a personal care giver which is typically something the family will have to pay for out of pocket but will route to PCP for review.

## 2015-08-04 NOTE — Telephone Encounter (Signed)
Daughter advised, verbalized understanding. No further questions.

## 2015-08-04 NOTE — Telephone Encounter (Signed)
You are correct, that is not covered. I would have her do a search for a personal caregiver.

## 2015-08-10 ENCOUNTER — Ambulatory Visit: Payer: Medicare Other | Admitting: Sports Medicine

## 2015-08-10 ENCOUNTER — Encounter: Payer: Self-pay | Admitting: Cardiology

## 2015-08-10 ENCOUNTER — Ambulatory Visit (INDEPENDENT_AMBULATORY_CARE_PROVIDER_SITE_OTHER): Payer: Medicare Other | Admitting: Cardiology

## 2015-08-10 VITALS — BP 127/72 | HR 76 | Ht 67.0 in | Wt 140.0 lb

## 2015-08-10 DIAGNOSIS — I25119 Atherosclerotic heart disease of native coronary artery with unspecified angina pectoris: Secondary | ICD-10-CM

## 2015-08-10 DIAGNOSIS — I5032 Chronic diastolic (congestive) heart failure: Secondary | ICD-10-CM

## 2015-08-10 DIAGNOSIS — I10 Essential (primary) hypertension: Secondary | ICD-10-CM

## 2015-08-10 DIAGNOSIS — R0602 Shortness of breath: Secondary | ICD-10-CM | POA: Diagnosis not present

## 2015-08-10 DIAGNOSIS — I2510 Atherosclerotic heart disease of native coronary artery without angina pectoris: Secondary | ICD-10-CM | POA: Diagnosis not present

## 2015-08-10 DIAGNOSIS — E785 Hyperlipidemia, unspecified: Secondary | ICD-10-CM

## 2015-08-10 DIAGNOSIS — R0600 Dyspnea, unspecified: Secondary | ICD-10-CM

## 2015-08-10 NOTE — Patient Instructions (Signed)
Medication Instructions:   NO CHANGE  Labwork:  Your physician recommends that you HAVE LAB WORK TODAY  Follow-Up:  Your physician recommends that you schedule a follow-up appointment in: [place]

## 2015-08-11 ENCOUNTER — Encounter: Payer: Self-pay | Admitting: Sports Medicine

## 2015-08-11 ENCOUNTER — Telehealth: Payer: Self-pay | Admitting: *Deleted

## 2015-08-11 ENCOUNTER — Ambulatory Visit (INDEPENDENT_AMBULATORY_CARE_PROVIDER_SITE_OTHER): Payer: Medicare Other

## 2015-08-11 ENCOUNTER — Ambulatory Visit (INDEPENDENT_AMBULATORY_CARE_PROVIDER_SITE_OTHER): Payer: Medicare Other | Admitting: Sports Medicine

## 2015-08-11 VITALS — BP 183/64 | HR 86

## 2015-08-11 DIAGNOSIS — M1712 Unilateral primary osteoarthritis, left knee: Secondary | ICD-10-CM | POA: Diagnosis not present

## 2015-08-11 DIAGNOSIS — D170 Benign lipomatous neoplasm of skin and subcutaneous tissue of head, face and neck: Secondary | ICD-10-CM | POA: Diagnosis not present

## 2015-08-11 DIAGNOSIS — S0990XA Unspecified injury of head, initial encounter: Secondary | ICD-10-CM | POA: Diagnosis not present

## 2015-08-11 DIAGNOSIS — I25119 Atherosclerotic heart disease of native coronary artery with unspecified angina pectoris: Secondary | ICD-10-CM

## 2015-08-11 LAB — BASIC METABOLIC PANEL
BUN: 67 mg/dL — AB (ref 7–25)
CALCIUM: 9.2 mg/dL (ref 8.6–10.3)
CO2: 22 mmol/L (ref 20–31)
CREATININE: 2.16 mg/dL — AB (ref 0.70–1.11)
Chloride: 97 mmol/L — ABNORMAL LOW (ref 98–110)
Glucose, Bld: 97 mg/dL (ref 65–99)
Potassium: 4.1 mmol/L (ref 3.5–5.3)
Sodium: 136 mmol/L (ref 135–146)

## 2015-08-11 LAB — BRAIN NATRIURETIC PEPTIDE: BRAIN NATRIURETIC PEPTIDE: 190.2 pg/mL — AB (ref <100)

## 2015-08-11 NOTE — Telephone Encounter (Signed)
-----   Message from Lelon Perla, MD sent at 08/11/2015  5:14 AM EDT ----- Hold lasix for 2 days, then decrease to 40 mg daily, bmet monday7/10 [name]

## 2015-08-11 NOTE — Telephone Encounter (Signed)
Left message for pt to call.

## 2015-08-11 NOTE — Assessment & Plan Note (Signed)
Recent fall, closed couple of lacerations on his forehead and elbow with Dermabond. CT head to evaluate for subdural. Discontinuing all medications that could increase his risk for fall including oxycodone and Lyrica. He does sound somewhat slurred today. Adding serum tox screen.

## 2015-08-11 NOTE — Progress Notes (Signed)
  Subjective:    CC: Fall and Orthovisc  HPI: Left knee arthritis: Here for Orthovisc injection.  Fall: Fell this morning impacting his face and elbow, has lacerations on the face, on the elbow. Feeling somewhat slow and slurred.  Past medical history, Surgical history, Family history not pertinant except as noted below, Social history, Allergies, and medications have been entered into the medical record, reviewed, and no changes needed.   Review of Systems: No fevers, chills, night sweats, weight loss, chest pain, or shortness of breath.   Objective:    General: Well Developed, well nourished, and in no acute distress.  Neuro: Alert and oriented x3, extra-ocular muscles intact, sensation grossly intact. Moving all 4 extremity, ambulatory, but somewhat somnolent and slurring speech. HEENT: Normocephalic, atraumatic, pupils equal round reactive to light, neck supple, no masses, no lymphadenopathy, thyroid nonpalpable.  Skin: Warm and dry, no rashes.Small laceration approximately 1 cm on the left forehead, there is a 4 cm laceration on the right elbow Cardiac: Regular rate and rhythm, no murmurs rubs or gallops, no lower extremity edema.  Respiratory: Clear to auscultation bilaterally. Not using accessory muscles, speaking in full sentences.  Procedure: Real-time Ultrasound Guided Injection of left knee Device: GE Logiq E  Verbal informed consent obtained.  Time-out conducted.  Noted no overlying erythema, induration, or other signs of local infection.  Skin prepped in a sterile fashion.  Local anesthesia: Topical Ethyl chloride.  With sterile technique and under real time ultrasound guidance:   30 mg/2 mL of OrthoVisc (sodium hyaluronate) in a prefilled syringe was injected easily into the knee through a 22-gauge needle. Completed without difficulty  Pain immediately resolved suggesting accurate placement of the medication.  Advised to call if fevers/chills, erythema, induration,  drainage, or persistent bleeding.  Images permanently stored and available for review in the ultrasound unit.  Impression: Technically successful ultrasound guided injection.  Laceration repair: Indication: bleeding Location: Left forehead Size: 1cm Laceration closed with Dermabond Tolerated well Routine postprocedure instructions d/w pt- keep area clean and bandaged, follow up if concerns/spreading erythema/pain.    Laceration repair: Indication: bleeding Location: Right elbow Size: 4 cm Laceration closed with Dermabond Tolerated well Routine postprocedure instructions d/w pt- keep area clean and bandaged, follow up if concerns/spreading erythema/pain.    Impression and Recommendations:

## 2015-08-11 NOTE — Assessment & Plan Note (Signed)
Orthovisc injection #2, return in one week for #3

## 2015-08-15 LAB — DRUG SCREEN 10 W/CONF, SERUM

## 2015-08-15 NOTE — Telephone Encounter (Signed)
Spoke with pt wife, the is currently in the hosp in [place] for pneumonia.

## 2015-08-17 ENCOUNTER — Telehealth: Payer: Self-pay | Admitting: Cardiology

## 2015-08-17 NOTE — Telephone Encounter (Signed)
Spoke with pt dtr, aware pt is fine to do PT.

## 2015-08-17 NOTE — Telephone Encounter (Signed)
New Message   Daughter called to let you know that the Pt is in the hospital w/pnuemonia. She would like advice as to whether or not the pt should have physical therapy with his heart condition. Please call.

## 2015-08-18 ENCOUNTER — Ambulatory Visit: Payer: Medicare Other | Admitting: Sports Medicine

## 2015-08-19 NOTE — Progress Notes (Signed)
HPI: FU AVR; hx of CAD s/p CABG, aortic stenosis s/p TAVR via transfemoral approach in 5/16, HTN, HL, diastolic HF, COPD.  LHC in 4/16 prior to his TAVR demonstrated severe 3 vessel CAD and patent LIMA-LAD and patent SVG-OM2. There was at least moderate stenosis of the SVG-OM2. There were no good revascularization options because of the diffuse nature of his CAD and distal vessel location. Echo 5/17 demonstrated normally functioning TAVR (mean gradient 14 mmHg) with mild perivalvular AI and mild mitral regurgitation that appeared worse than previous echo.  Recently seen with increased dyspnea despite increased dose of lasix; cath planned but cancelled; found to have pneumonia; CTA 6/17 showed no pulmonary embolus; There is note of multilobar airspace disease on the right suggestive of pneumonia. At last office visit on July 5 recheck blood work and patient appear to be significantly prerenal. Lasix was decreased. He was ultimately admitted for pneumonia. Since last seen,   Current Outpatient Prescriptions  Medication Sig Dispense Refill  . albuterol (PROVENTIL HFA;VENTOLIN HFA) 108 (90 BASE) MCG/ACT inhaler Inhale 1 puff into the lungs every 4 (four) hours as needed for shortness of breath.     . allopurinol (ZYLOPRIM) 100 MG tablet TAKE 1 TABLET BY MOUTH EVERY DAY 90 tablet 2  . aspirin 81 MG tablet Take 81 mg by mouth at bedtime.     Marland Kitchen atorvastatin (LIPITOR) 40 MG tablet TAKE 1 TABLET (40 MG TOTAL) BY MOUTH DAILY. 30 tablet 0  . budesonide-formoterol (SYMBICORT) 160-4.5 MCG/ACT inhaler Inhale 2 puffs into the lungs every morning. Inhale 1 puff in the evening    . fluticasone (FLONASE) 50 MCG/ACT nasal spray Place 1 spray into both nostrils daily.    . furosemide (LASIX) 40 MG tablet TAKE 1 TABLET (40 MG TOTAL) BY MOUTH 2 (TWO) TIMES DAILY. 180 tablet 3  . isosorbide mononitrate (IMDUR) 60 MG 24 hr tablet TAKE 1 TABLET (60 MG TOTAL) BY MOUTH DAILY. 30 tablet 11  . megestrol (MEGACE) 400  MG/10ML suspension Take 20 mLs (800 mg total) by mouth daily. 240 mL 5  . metoprolol tartrate (LOPRESSOR) 25 MG tablet TAKE 1 TABLET (25 MG TOTAL) BY MOUTH 2 (TWO) TIMES DAILY. 60 tablet 6  . mirtazapine (REMERON) 30 MG tablet Take 1 tablet (30 mg total) by mouth at bedtime. 30 tablet 11  . Tiotropium Bromide Monohydrate (SPIRIVA RESPIMAT) 2.5 MCG/ACT AERS Inhale 2.5 mg into the lungs every morning.     No current facility-administered medications for this visit.     Past Medical History  Diagnosis Date  . Hypertension   . Asthma   . Gout   . Hyperlipidemia   . Aortic stenosis   . CAD (coronary artery disease)   . Inguinal hernia, right 05/15/2013    "they couldn't do OR"  . COPD (chronic obstructive pulmonary disease) ([place]) 10/11/2011  . Coronary artery disease involving native coronary artery   . Coronary artery disease involving coronary bypass graft 05/10/2014  . Zenker's diverticulum 05/21/2014  . Chronic combined systolic and diastolic congestive heart failure ([place])   . Mitral regurgitation   . Anginal pain ([place])   . Shortness of breath dyspnea   . GERD (gastroesophageal reflux disease)   . 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 07/02/2014 TAVR: 23 mm Edwards Sapien 3 transcatheter heart valve placed via open right transfemoral approach;  b. 07/03/2014 Echo: EF 50-55%, no central or paravalvular leak, nl transaortic gradients, mod MR, mildly dil  LA, mildly reduced RV fxn, PASP 46 mmHg.  Marland Kitchen Heart murmur   . Stroke [facility]) 1965    denies residual on 07/30/2014  . Arthritis     "all over"  . Chronic neck pain   . Chronic pain of left knee   . Chronic kidney disease (CKD)   . Skin cancer     REMOVED L ELBOW  . NSTEMI (non-ST elevated myocardial infarction) ([place]) 07/30/2014    Past Surgical History  Procedure Laterality Date  . Left and right heart catheterization with coronary/graft angiogram N/A 05/10/2014    Procedure: LEFT AND RIGHT HEART CATHETERIZATION  WITH Beatrix Fetters;  Surgeon: Sherren Mocha, MD;  Location: [facility] CATH LAB;  Service: Cardiovascular;  Laterality: N/A;  . Tee without cardioversion N/A 05/17/2014    Procedure: TRANSESOPHAGEAL ECHOCARDIOGRAM (TEE);  Surgeon: Lelon Perla, MD;  Location: [facility] ENDOSCOPY;  Service: Cardiovascular;  Laterality: N/A;  . Transcatheter aortic valve replacement, transfemoral N/A 07/02/2014    Procedure: TRANSCATHETER AORTIC VALVE REPLACEMENT, TRANSFEMORAL;  Surgeon: Sherren Mocha, MD;  Location: [place];  Service: Open Heart Surgery;  Laterality: N/A;  . Tee without cardioversion N/A 07/02/2014    Procedure: TRANSESOPHAGEAL ECHOCARDIOGRAM (TEE);  Surgeon: Sherren Mocha, MD;  Location: [place];  Service: Open Heart Surgery;  Laterality: N/A;  . Cardiac valve replacement    . Cardiac catheterization      "I've had 2-3 cardiac caths"  . Cataract extraction      "? side; ? lens placed"  . Coronary artery bypass graft  01/06/1997    LIMA to LAD, SVG to OM, open SVG harvest right lower leg - [facility]  . Skin cancer excision Left     "elbow"    Social History   Social History  . Marital Status: Widowed    Spouse Name: N/A  . Number of Children: 3  . Years of Education: N/A   Occupational History  . Not on file.   Social History Main Topics  . Smoking status: Former Smoker -- 3.00 packs/day for 20 years    Types: Cigarettes    Quit date: 02/06/1963  . Smokeless tobacco: Never Used  . Alcohol Use: 4.2 oz/week    0 Standard drinks or equivalent, 7 Cans of beer per week  . Drug Use: No  . Sexual Activity: Not Currently   Other Topics Concern  . Not on file   Social History Narrative    Family History  Problem Relation Age of Onset  . Heart attack Mother   . Heart attack Brother     ROS: no fevers or chills, productive cough, hemoptysis, dysphasia, odynophagia, melena, hematochezia, dysuria, hematuria, rash, seizure activity, orthopnea, PND, pedal edema,  claudication. Remaining systems are negative.  Physical Exam: Well-developed well-nourished in no acute distress.  Skin is warm and dry.  HEENT is normal.  Neck is supple.  Chest is clear to auscultation with normal expansion.  Cardiovascular exam is regular rate and rhythm.  Abdominal exam nontender or distended. No masses palpated. Extremities show no edema. neuro grossly intact  ECG    This encounter was created in error - please disregard.

## 2015-08-23 ENCOUNTER — Telehealth: Payer: Self-pay | Admitting: Cardiology

## 2015-08-24 ENCOUNTER — Encounter: Payer: Medicare Other | Admitting: Cardiology

## 2015-08-24 NOTE — Telephone Encounter (Signed)
Closed encounter °

## 2015-08-27 ENCOUNTER — Other Ambulatory Visit: Payer: Self-pay | Admitting: Sports Medicine

## 2015-09-06 ENCOUNTER — Encounter: Payer: Self-pay | Admitting: Sports Medicine

## 2015-09-06 ENCOUNTER — Ambulatory Visit (INDEPENDENT_AMBULATORY_CARE_PROVIDER_SITE_OTHER): Payer: Medicare Other | Admitting: Sports Medicine

## 2015-09-06 DIAGNOSIS — I25119 Atherosclerotic heart disease of native coronary artery with unspecified angina pectoris: Secondary | ICD-10-CM | POA: Diagnosis not present

## 2015-09-06 DIAGNOSIS — R627 Adult failure to thrive: Secondary | ICD-10-CM | POA: Diagnosis not present

## 2015-09-06 MED ORDER — MIRTAZAPINE 30 MG PO TABS: 30.0000 mg | ORAL_TABLET | Freq: Every day | ORAL | 3 refills | Status: AC

## 2015-09-06 NOTE — Progress Notes (Signed)
  Subjective:    CC: follow-up from rehab   HPI: 82 yo with history of multiple recent falls and HCAP presenting for follow-up after discharge from rehab four days ago.  Since his discharge from SNF he has had trouble caring for himself - has not been eating well, taking his medicine as prescribed, and speaks of generally not being able to care for himself.  The home health aides and physical therapists from rehab have not followed up with him at home.  He is desiring to go back to rehab at [facility] rehab.  He currently lives alone in a one story home and is completely responsible for his ADLs, which he is unable to perform.  He also states he has not had a desire to eat over the past several months, despite taking mirtazapine daily.  He is open to the idea of assisted living because he understands he needs help taking care of herself.       Past medical history, Surgical history, Family history not pertinant except as noted below, Social history, Allergies, and medications have been entered into the medical record, reviewed, and no changes needed.   Review of Systems: No fevers, chills, night sweats, chest pain, or shortness of breath.  Loss of appetite.  Generalized fatigue and weakness.    Objective:    General: Cachetic, ill-appearing. No acute distress.  Neuro: Alert and oriented x3, extra-ocular muscles intact, sensation grossly intact.  HEENT: Normocephalic, atraumatic, pupils equal round reactive to light, neck supple, no masses, no lymphadenopathy, thyroid nonpalpable.  Skin: Warm and dry.  Multiple large ecchymoses on bilateral arms and legs.  Cardiac: Regular rate and rhythm.  2/6 systolic murmur over L upper sternal border. Respiratory: Clear to auscultation bilaterally. Not using accessory muscles, speaking in full sentences.   Impression and Recommendations:   1. Follow-up on hospitalization for falls -Has not been doing well since being released from SNF.  Has difficulties  completing ADLs and taking care of himself. -Will connect patient with social worker to look into SNF vs. Assisted living placement.  Patient desires to return to [facility] rehab.   2. Failure to thrive -Appetite has not improved on mirtazapine 15mg . -Will increase mirtazapine to 30mg .

## 2015-09-06 NOTE — Assessment & Plan Note (Signed)
Increasing mirtazapine to 30 mg. At this point Eric Buchanan has had multiple falls at home, pneumonia, and is no longer able to accomplish his ADLs. For this reason we are going to do a referral to social work for placement, ideally at [facility] rehabilitation and SNF.

## 2015-09-15 ENCOUNTER — Telehealth: Payer: Self-pay

## 2015-09-15 NOTE — Telephone Encounter (Signed)
Pt daughter called wondering if she can get a letter for her job stating she's his only caregiver and gets him back and forth to his appointment. Please advise.

## 2015-09-19 NOTE — Telephone Encounter (Signed)
Letter in box. 

## 2015-09-29 ENCOUNTER — Ambulatory Visit (INDEPENDENT_AMBULATORY_CARE_PROVIDER_SITE_OTHER): Payer: Medicare Other | Admitting: Sports Medicine

## 2015-09-29 ENCOUNTER — Encounter: Payer: Self-pay | Admitting: Sports Medicine

## 2015-09-29 DIAGNOSIS — R627 Adult failure to thrive: Secondary | ICD-10-CM

## 2015-09-29 DIAGNOSIS — I25119 Atherosclerotic heart disease of native coronary artery with unspecified angina pectoris: Secondary | ICD-10-CM

## 2015-09-29 DIAGNOSIS — M1712 Unilateral primary osteoarthritis, left knee: Secondary | ICD-10-CM

## 2015-09-29 NOTE — Assessment & Plan Note (Signed)
Never finished his Orthovisc series. Steroid and Orthovisc injection today. Return in one week for #4 of 4.

## 2015-09-29 NOTE — Assessment & Plan Note (Signed)
Still awaiting social work getting him into a skilled nursing or assisted living facility. He has gained a couple of pounds on Megace.

## 2015-09-29 NOTE — Progress Notes (Signed)
  Subjective:    CC: Follow-up  HPI: Adult failure to thrive: Starting to get some weight gain with Megace and mirtazapine, awaiting social work referral for [facility] and placement. His daughter is also going through the [place] for this.  Left knee osteoarthritis: Never finished his Orthovisc series, agreeable to start were we left off.  Past medical history, Surgical history, Family history not pertinant except as noted below, Social history, Allergies, and medications have been entered into the medical record, reviewed, and no changes needed.   Review of Systems: No fevers, chills, night sweats, weight loss, chest pain, or shortness of breath.   Objective:    General: Well Developed, well nourished, and in no acute distress.  Neuro: Alert and oriented x3, extra-ocular muscles intact, sensation grossly intact.  HEENT: Normocephalic, atraumatic, pupils equal round reactive to light, neck supple, no masses, no lymphadenopathy, thyroid nonpalpable.  Skin: Warm and dry, no rashes. Cardiac: Regular rate and rhythm, no murmurs rubs or gallops, no lower extremity edema.  Respiratory: Clear to auscultation bilaterally. Not using accessory muscles, speaking in full sentences.  Procedure: Real-time Ultrasound Guided Injection of left knee Device: GE Logiq E  Verbal informed consent obtained.  Time-out conducted.  Noted no overlying erythema, induration, or other signs of local infection.  Skin prepped in a sterile fashion.  Local anesthesia: Topical Ethyl chloride.  With sterile technique and under real time ultrasound guidance:  Aspirated 20 mL straw-colored fluid, syringe switched and 1 mL kenalog 40, 2 mL lidocaine, 2 mL Marcaine injected easily, syringe again switched and 30 mg/2 mL of OrthoVisc (sodium hyaluronate) in a prefilled syringe was injected easily into the knee through a 18-gauge needle. Completed without difficulty  Pain immediately resolved suggesting accurate placement of the  medication.  Advised to call if fevers/chills, erythema, induration, drainage, or persistent bleeding.  Images permanently stored and available for review in the ultrasound unit.  Impression: Technically successful ultrasound guided injection.  Impression and Recommendations:    Osteoarthritis of left knee Never finished his Orthovisc series. Steroid and Orthovisc injection today. Return in one week for #4 of 4.  Failure to thrive in adult Still awaiting social work getting him into a skilled nursing or assisted living facility. He has gained a couple of pounds on Megace.  I spent 40 minutes with this patient, greater than 50% was face-to-face time counseling regarding the above diagnoses, this was separate from the time spent performing the above procedure

## 2015-10-01 ENCOUNTER — Other Ambulatory Visit: Payer: Self-pay | Admitting: Sports Medicine

## 2015-10-01 DIAGNOSIS — F320 Major depressive disorder, single episode, mild: Secondary | ICD-10-CM

## 2015-10-01 DIAGNOSIS — F32A Depression, unspecified: Secondary | ICD-10-CM

## 2015-10-02 ENCOUNTER — Other Ambulatory Visit: Payer: Self-pay | Admitting: Sports Medicine

## 2015-10-06 ENCOUNTER — Ambulatory Visit (INDEPENDENT_AMBULATORY_CARE_PROVIDER_SITE_OTHER): Payer: Medicare Other

## 2015-10-06 ENCOUNTER — Ambulatory Visit (INDEPENDENT_AMBULATORY_CARE_PROVIDER_SITE_OTHER): Payer: Medicare Other | Admitting: Sports Medicine

## 2015-10-06 DIAGNOSIS — R627 Adult failure to thrive: Secondary | ICD-10-CM

## 2015-10-06 DIAGNOSIS — I5032 Chronic diastolic (congestive) heart failure: Secondary | ICD-10-CM

## 2015-10-06 DIAGNOSIS — M1712 Unilateral primary osteoarthritis, left knee: Secondary | ICD-10-CM

## 2015-10-06 DIAGNOSIS — S51012D Laceration without foreign body of left elbow, subsequent encounter: Secondary | ICD-10-CM

## 2015-10-06 DIAGNOSIS — I2510 Atherosclerotic heart disease of native coronary artery without angina pectoris: Secondary | ICD-10-CM | POA: Diagnosis not present

## 2015-10-06 DIAGNOSIS — I25119 Atherosclerotic heart disease of native coronary artery with unspecified angina pectoris: Secondary | ICD-10-CM

## 2015-10-06 LAB — COMPREHENSIVE METABOLIC PANEL
ALT: 20 U/L (ref 9–46)
AST: 21 U/L (ref 10–35)
Albumin: 3.9 g/dL (ref 3.6–5.1)
Alkaline Phosphatase: 70 U/L (ref 40–115)
Calcium: 9.4 mg/dL (ref 8.6–10.3)
Total Bilirubin: 0.7 mg/dL (ref 0.2–1.2)
Total Protein: 6.7 g/dL (ref 6.1–8.1)

## 2015-10-06 LAB — CBC
HCT: 44.1 % (ref 38.5–50.0)
Hemoglobin: 15.2 g/dL (ref 13.2–17.1)
MCH: 32.8 pg (ref 27.0–33.0)
MCHC: 34.5 g/dL (ref 32.0–36.0)
MCV: 95.2 fL (ref 80.0–100.0)
MPV: 11.2 fL (ref 7.5–12.5)
Platelets: 215 K/uL (ref 140–400)
RBC: 4.63 MIL/uL (ref 4.20–5.80)
RDW: 16.7 % — ABNORMAL HIGH (ref 11.0–15.0)
WBC: 9.6 10*3/uL (ref 3.8–10.8)

## 2015-10-06 LAB — COMPREHENSIVE METABOLIC PANEL WITH GFR
BUN: 59 mg/dL — ABNORMAL HIGH (ref 7–25)
CO2: 20 mmol/L (ref 20–31)
Chloride: 104 mmol/L (ref 98–110)
Creat: 1.98 mg/dL — ABNORMAL HIGH (ref 0.70–1.11)
Glucose, Bld: 119 mg/dL — ABNORMAL HIGH (ref 65–99)
Potassium: 3.9 mmol/L (ref 3.5–5.3)
Sodium: 139 mmol/L (ref 135–146)

## 2015-10-06 MED ORDER — MOXIFLOXACIN HCL 400 MG PO TABS: 400.0000 mg | ORAL_TABLET | Freq: Every day | ORAL | 0 refills | Status: AC

## 2015-10-06 NOTE — Assessment & Plan Note (Signed)
Somewhat short of breath today, checking a chest x-ray and routine blood work.

## 2015-10-06 NOTE — Assessment & Plan Note (Signed)
Laceration closed with Dermabond.

## 2015-10-06 NOTE — Assessment & Plan Note (Signed)
[facility]

## 2015-10-06 NOTE — Assessment & Plan Note (Signed)
Final Orthovisc injection given today. Swelling has improved significantly.

## 2015-10-06 NOTE — Progress Notes (Addendum)
  Subjective:    CC:  Follow-up  HPI: Left knee arthritis: Here for Orthovisc injection #4, does not have as much swelling tay.  Failure to thrive: Doing well with Megace.  Still awaiting placement.  Left elbow laceration: Had a fall out of bed last night. Mostly hemostatic.  Past medical history, Surgical history, Family history not pertinant except as noted below, Social history, Allergies, and medications have been entered into the medical record, reviewed, and no changes needed.   Review of Systems: No fevers, chills, night sweats, weight loss, chest pain, or shortness of breath.   Objective:    General: Well Developed, well nourished, and in no acute distress.  Neuro: Alert and oriented x3, extra-ocular muscles intact, sensation grossly intact.  HEENT: Normocephalic, atraumatic, pupils equal round reactive to light, neck supple, no masses, no lymphadenopathy, thyroid nonpalpable.  Skin: Warm and dry, no rashes. Cardiac: Regular rate and rhythm, No rubs or gallops, 1/6 systolic ejection murmur, no lower extremity edema.  Respiratory: Clear to auscultation bilaterally. Not using accessory muscles, speaking in full sentences.Appears somewhat short of breath. Left elbow: There is a superficial laceration over the olecranon  Laceration repair: Indication: bleeding Location: Left elbow Size: 2 cm Anesthesia: None needed Wound explored, irrigated, FB removed (if present) Wound closed using Dermabond Tolerated well Routine postprocedure instructions d/w pt- keep area clean and bandaged, follow up if concerns/spreading erythema/pain.   Follow up for for suture removal.  Procedure: Real-time Ultrasound Guided Injection of left knee Device: GE Logiq E  Verbal informed consent obtained.  Time-out conducted.  Noted no overlying erythema, induration, or other signs of local infection.  Skin prepped in a sterile fashion.  Local anesthesia: Topical Ethyl chloride.  With sterile  technique and under real time ultrasound guidance:   30 mg/2 mL of OrthoVisc (sodium hyaluronate) in a prefilled syringe was injected easily into the knee through a 22-gauge needle. Completed without difficulty  Pain immediately resolved suggesting accurate placement of the medication.  Advised to call if fevers/chills, erythema, induration, drainage, or persistent bleeding.  Images permanently stored and available for review in the ultrasound unit.  Impression: Technically successful ultrasound guided injection.  Impression and Recommendations:    Osteoarthritis of left knee Final Orthovisc injection given today. Swelling has improved significantly.  Failure to thrive in adult Still awaiting placement by the [place].   Chronic diastolic congestive heart failure (HCC) Somewhat short of breath today, checking a chest x-ray and routine blood work.  Laceration of elbow, left Laceration closed with Dermabond.  I spent 25 minutes with this patient, greater than 50% was face-to-face time counseling regarding the above diagnoses, this was separate from the time spent performing the above procedure

## 2015-10-06 NOTE — Assessment & Plan Note (Signed)
Chest x-ray shows questionable subtle infiltrate, adding Avelox.

## 2015-10-07 LAB — D-DIMER, QUANTITATIVE: D-Dimer, Quant: 1.41 mcg/mL FEU — ABNORMAL HIGH (ref <0.50)

## 2015-10-07 LAB — BRAIN NATRIURETIC PEPTIDE: Brain Natriuretic Peptide: 324.9 pg/mL — ABNORMAL HIGH (ref <100)

## 2015-10-18 ENCOUNTER — Ambulatory Visit: Payer: Medicare Other | Admitting: Sports Medicine

## 2015-11-01 ENCOUNTER — Other Ambulatory Visit: Payer: Self-pay | Admitting: Sports Medicine

## 2015-11-03 ENCOUNTER — Ambulatory Visit: Payer: Medicare Other | Admitting: Sports Medicine

## 2015-11-03 ENCOUNTER — Other Ambulatory Visit: Payer: Self-pay | Admitting: Sports Medicine

## 2016-02-06 DEATH — deceased

## 2017-04-02 IMAGING — CR DG CHEST 2V
2 series · 2 of 2 positions shown · non-contrast
Comparison: 06/16/2014

CLINICAL DATA: Hypertension, aortic stenosis

EXAM:
CHEST  2 VIEW

[w chest pa]
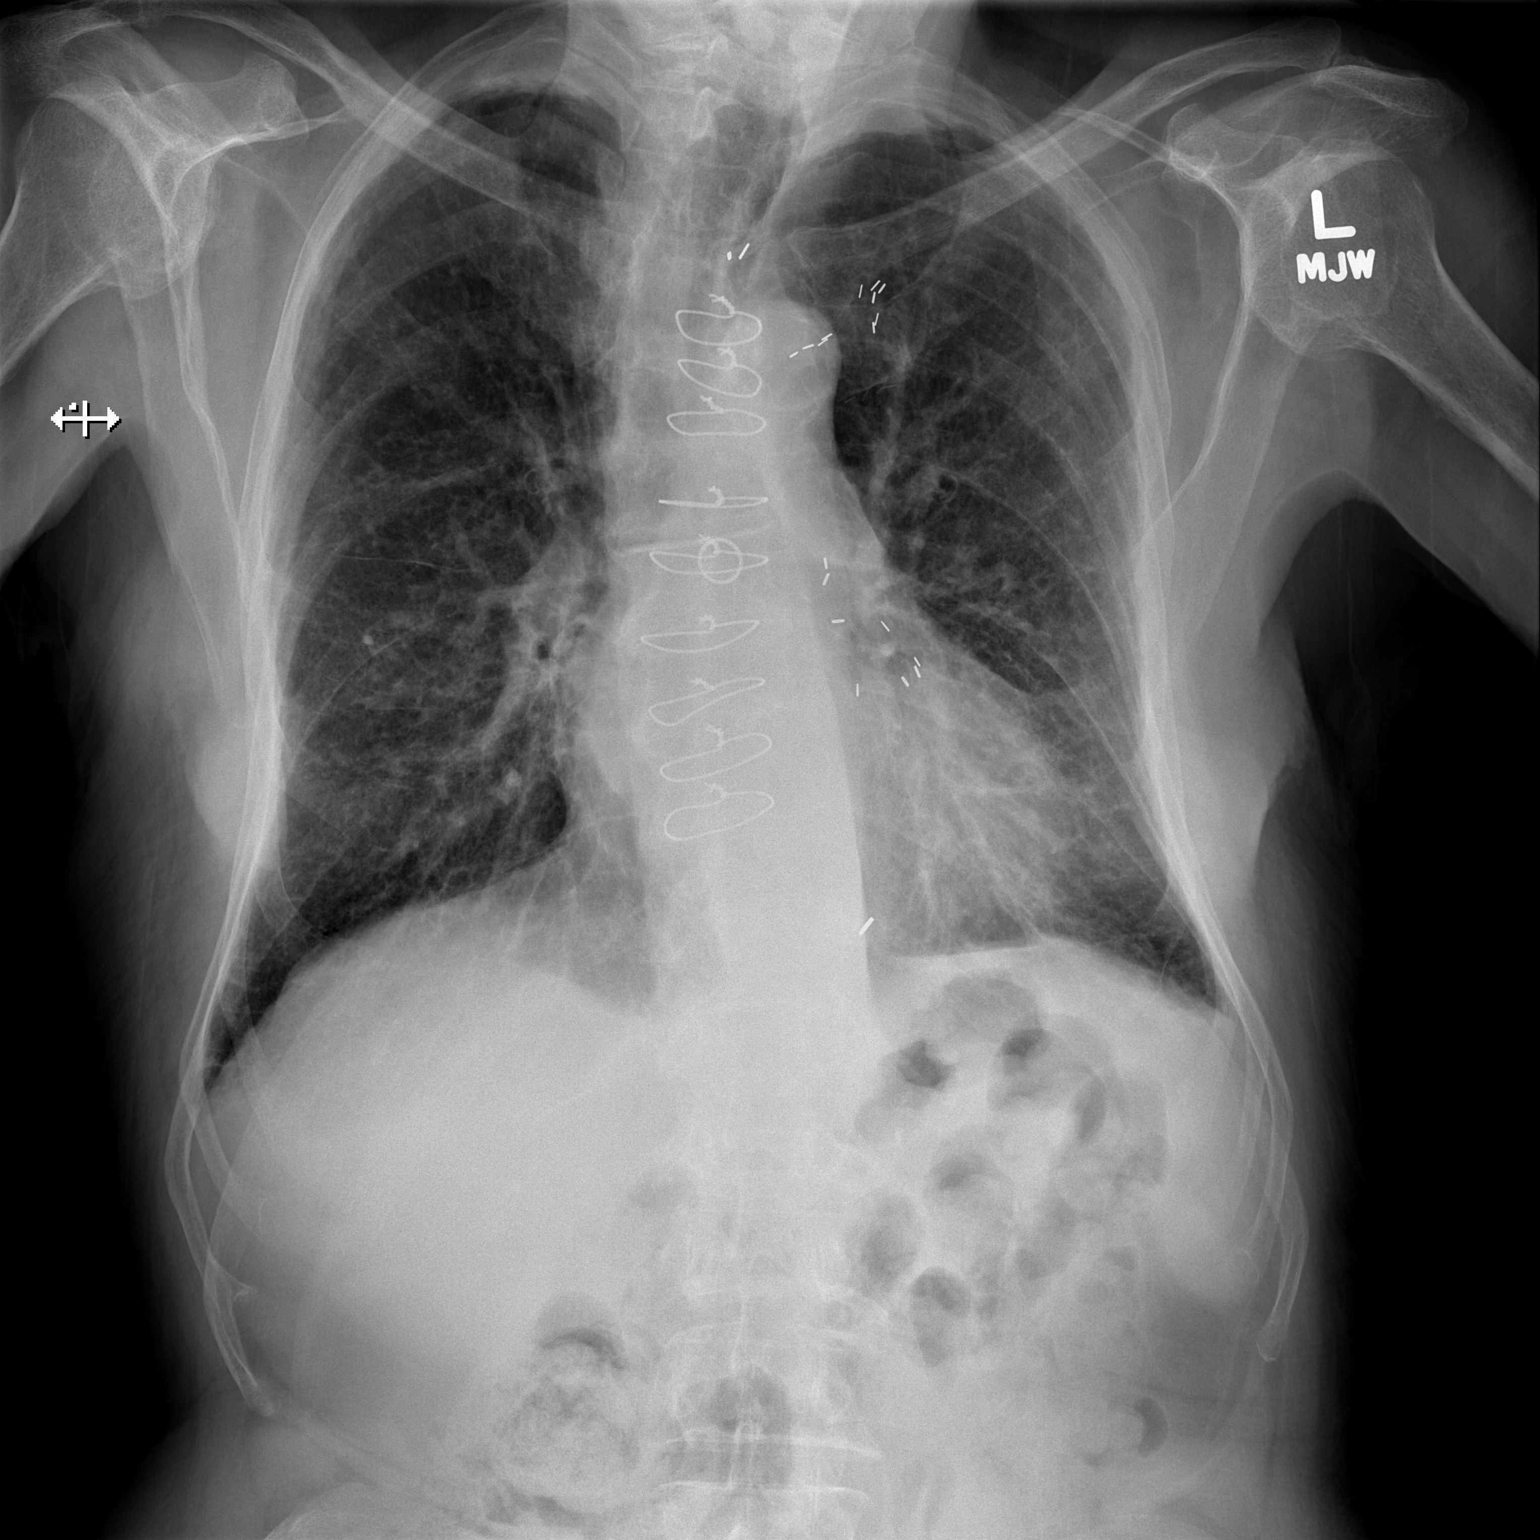

[w chest lat]
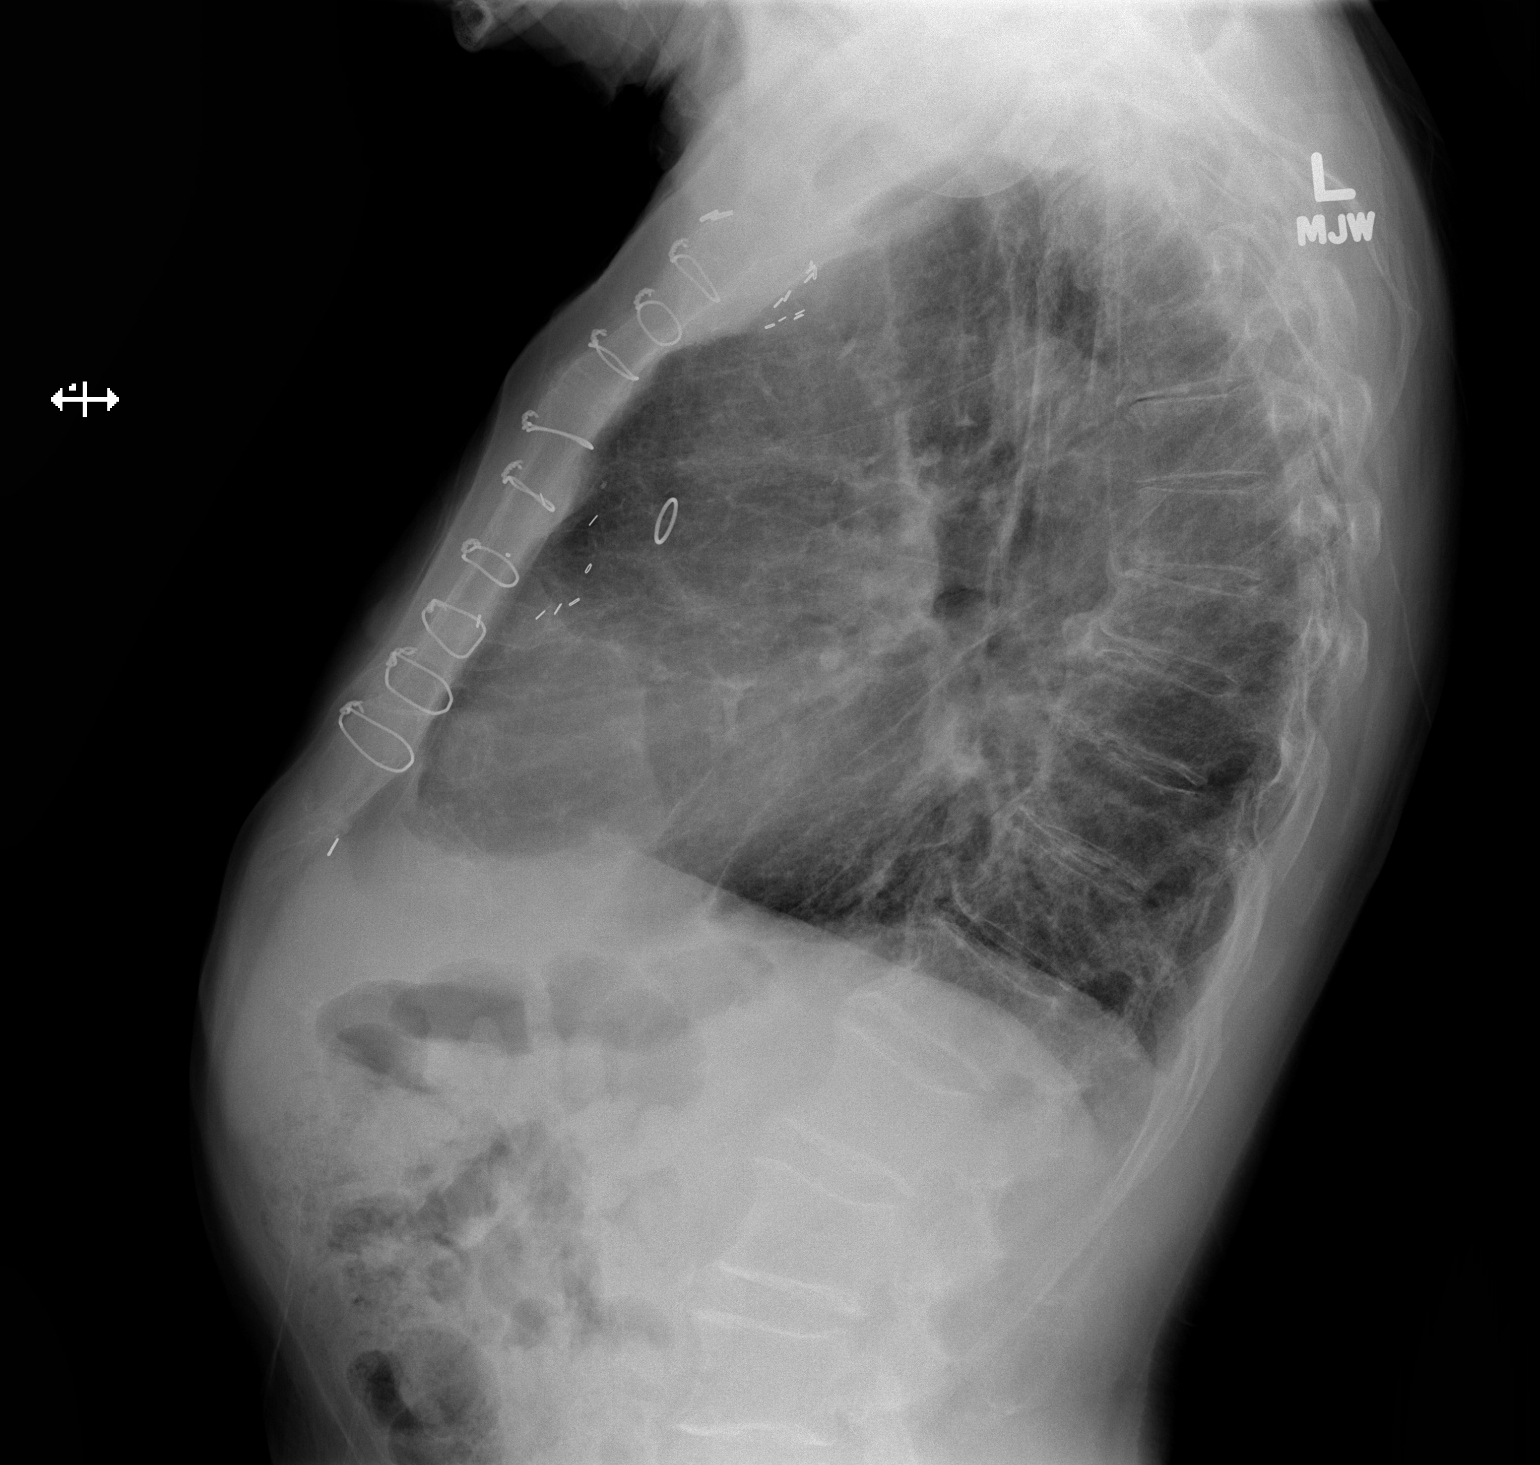

[2 of 2 positions shown; findings below may reference images not displayed]

FINDINGS: Borderline cardiomegaly. Again noted hyperinflation and chronic
interstitial prominence. No acute infiltrate or pulmonary edema. Six
status post median sternotomy. Osteopenia and degenerative changes
thoracic spine.
IMPRESSION: No active disease. Stable hyperinflation and chronic interstitial
prominence.

## 2017-04-04 IMAGING — CR DG CHEST 1V PORT
1 series · 1 of 1 positions shown · non-contrast
Comparison: June 30, 2014.

CLINICAL DATA: Status post transcatheter aortic valve replacement.

EXAM:
PORTABLE CHEST - 1 VIEW

[AP]
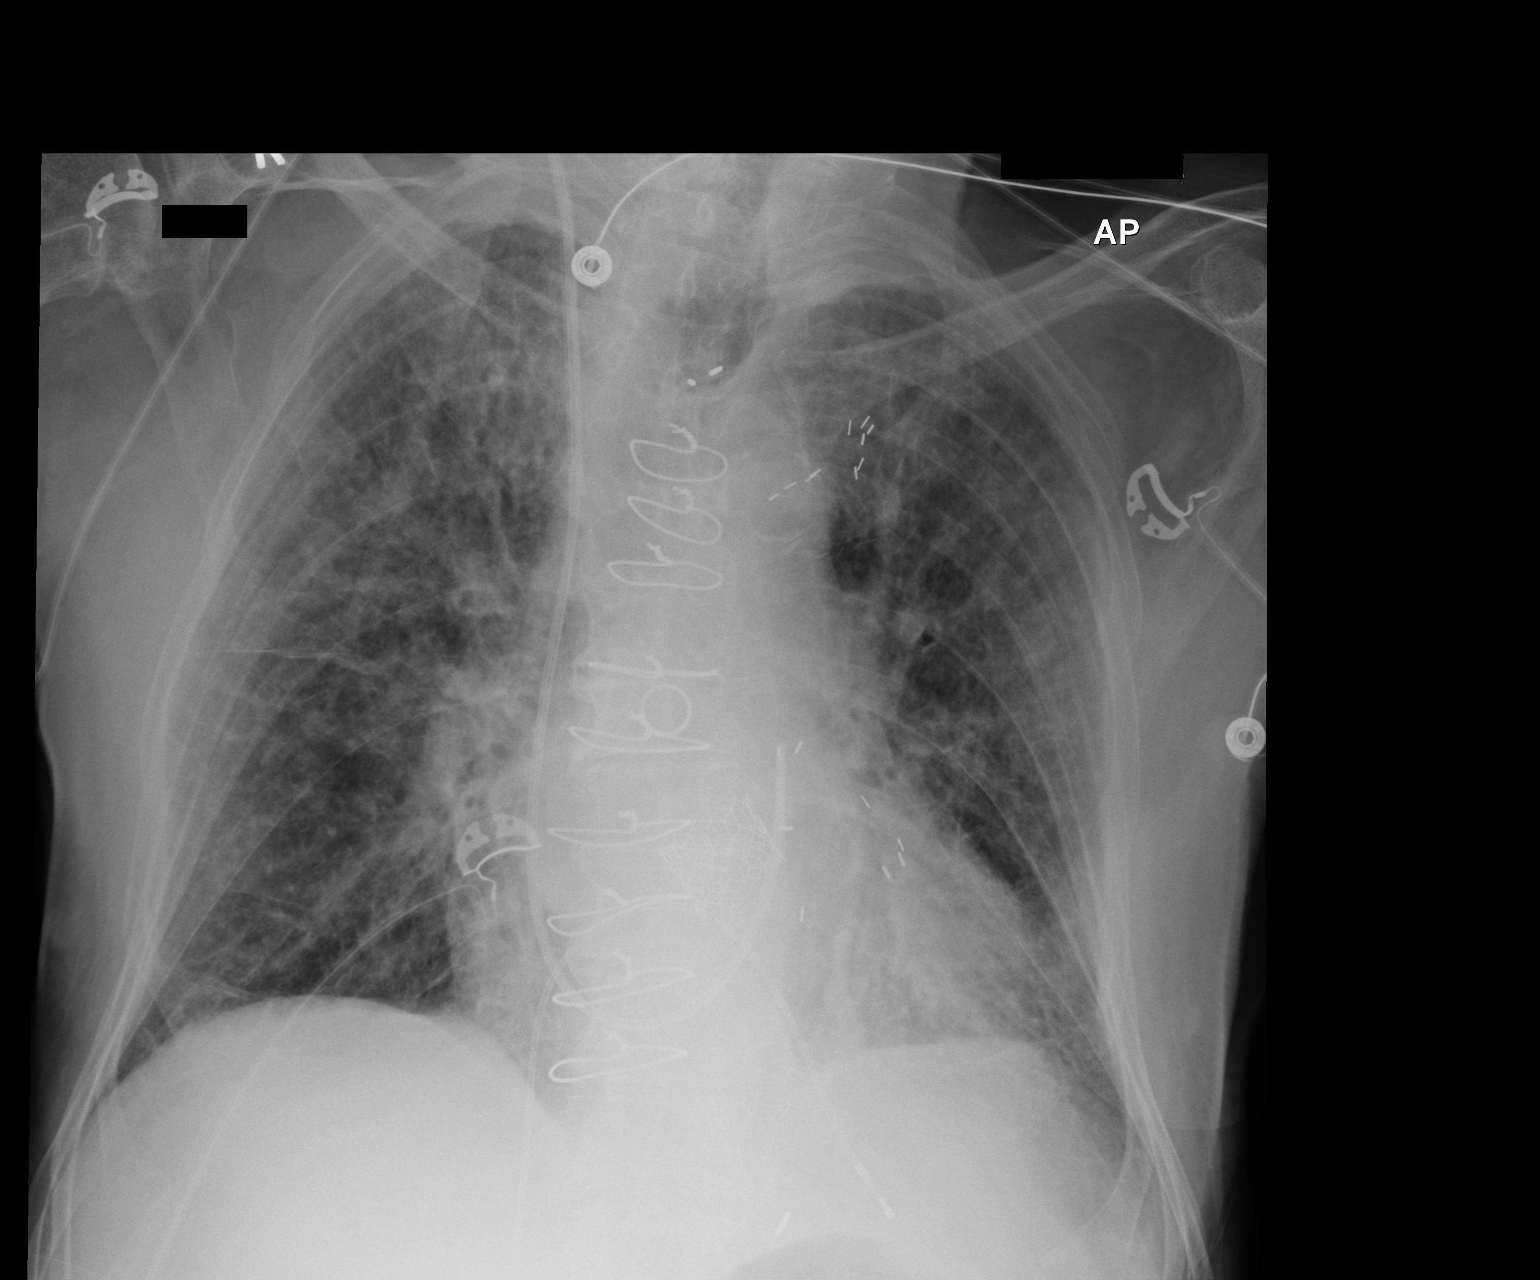

[1 of 1 positions shown; findings below may reference images not displayed]

FINDINGS: Stable cardiomediastinal silhouette. Sternotomy wires are noted.
Interval placement of a aortic valve prosthesis. Right internal
jugular Swan-Ganz catheter is noted with distal tip in expected
position of main pulmonary artery. No pneumothorax is noted. Mildly
increased interstitial densities are noted throughout both lungs
concerning for pulmonary edema. No significant pleural effusion is
noted.
IMPRESSION: Mildly increased interstitial densities are noted throughout both
lungs concerning for pulmonary edema. Right internal jugular
Swan-Ganz catheter tip is noted in expected position of main
pulmonary artery.
